# Patient Record
Sex: Female | Born: 1985 | Race: White | Hispanic: No | Marital: Married | Smoking: Former smoker
Health system: Southern US, Community
[De-identification: ages and names within clinical notes are randomized; demographics above are authoritative.]

## PROBLEM LIST (undated history)

## (undated) ENCOUNTER — Inpatient Hospital Stay: Payer: Self-pay

## (undated) DIAGNOSIS — I10 Essential (primary) hypertension: Secondary | ICD-10-CM

## (undated) DIAGNOSIS — R011 Cardiac murmur, unspecified: Secondary | ICD-10-CM

## (undated) DIAGNOSIS — G43909 Migraine, unspecified, not intractable, without status migrainosus: Secondary | ICD-10-CM

## (undated) DIAGNOSIS — D649 Anemia, unspecified: Secondary | ICD-10-CM

## (undated) DIAGNOSIS — R87629 Unspecified abnormal cytological findings in specimens from vagina: Secondary | ICD-10-CM

## (undated) HISTORY — PX: TONSILLECTOMY: SUR1361

## (undated) HISTORY — PX: WISDOM TOOTH EXTRACTION: SHX21

## (undated) HISTORY — PX: KNEE SURGERY: SHX244

---
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
## 2004-07-25 ENCOUNTER — Observation Stay: Payer: Self-pay

## 2004-10-06 ENCOUNTER — Observation Stay: Payer: Self-pay

## 2004-10-18 ENCOUNTER — Observation Stay: Payer: Self-pay

## 2004-10-29 ENCOUNTER — Observation Stay: Payer: Self-pay | Admitting: Obstetrics & Gynecology

## 2004-11-03 ENCOUNTER — Observation Stay: Payer: Self-pay

## 2004-11-07 ENCOUNTER — Inpatient Hospital Stay: Payer: Self-pay | Admitting: Obstetrics & Gynecology

## 2009-05-08 ENCOUNTER — Ambulatory Visit: Payer: Self-pay | Admitting: Internal Medicine

## 2009-06-01 ENCOUNTER — Ambulatory Visit: Payer: Self-pay | Admitting: Internal Medicine

## 2009-10-11 ENCOUNTER — Emergency Department: Payer: Self-pay | Admitting: Emergency Medicine

## 2009-10-13 ENCOUNTER — Other Ambulatory Visit: Payer: Self-pay

## 2012-07-11 ENCOUNTER — Emergency Department: Payer: Self-pay | Admitting: Internal Medicine

## 2012-07-11 LAB — CBC
HGB: 12.4 g/dL (ref 12.0–16.0)
MCH: 30.5 pg (ref 26.0–34.0)
Platelet: 235 10*3/uL (ref 150–440)
RBC: 4.07 10*6/uL (ref 3.80–5.20)
RDW: 12.1 % (ref 11.5–14.5)
WBC: 9.9 10*3/uL (ref 3.6–11.0)

## 2012-07-11 LAB — HCG, QUANTITATIVE, PREGNANCY: Beta Hcg, Quant.: 138923 m[IU]/mL — ABNORMAL HIGH

## 2012-07-11 LAB — URINALYSIS, COMPLETE
Glucose,UR: NEGATIVE mg/dL (ref 0–75)
Ketone: NEGATIVE
Nitrite: NEGATIVE
Ph: 5 (ref 4.5–8.0)
RBC,UR: 1 /HPF (ref 0–5)
Squamous Epithelial: 2
WBC UR: 3 /HPF (ref 0–5)

## 2012-09-20 ENCOUNTER — Encounter: Payer: Self-pay | Admitting: Maternal & Fetal Medicine

## 2012-10-09 ENCOUNTER — Encounter: Payer: Self-pay | Admitting: Pediatrics

## 2012-11-14 ENCOUNTER — Observation Stay: Payer: Self-pay

## 2012-11-14 LAB — URINALYSIS, COMPLETE
Bacteria: NONE SEEN
Bilirubin,UR: NEGATIVE
Glucose,UR: 50 mg/dL
Ketone: NEGATIVE
Leukocyte Esterase: NEGATIVE
Nitrite: NEGATIVE
Ph: 7
Protein: NEGATIVE
RBC,UR: NONE SEEN /HPF
Specific Gravity: 1.01
Squamous Epithelial: 1
WBC UR: 1 /HPF

## 2012-11-14 LAB — FETAL FIBRONECTIN
Appearance: NORMAL
Fetal Fibronectin: NEGATIVE

## 2012-12-05 ENCOUNTER — Observation Stay: Payer: Self-pay

## 2012-12-05 LAB — URINALYSIS, COMPLETE
Bilirubin,UR: NEGATIVE
Leukocyte Esterase: NEGATIVE
Ph: 6 (ref 4.5–8.0)
Protein: NEGATIVE
Specific Gravity: 1.006 (ref 1.003–1.030)
Squamous Epithelial: <1
WBC UR: <1 /HPF (ref 0–5)

## 2012-12-05 LAB — FETAL FIBRONECTIN
Appearance: NORMAL
Fetal Fibronectin: NEGATIVE

## 2013-01-08 ENCOUNTER — Observation Stay: Payer: Self-pay | Admitting: Obstetrics and Gynecology

## 2013-01-08 LAB — URINALYSIS, COMPLETE
Bilirubin,UR: NEGATIVE
Glucose,UR: 50 mg/dL (ref 0–75)
Ketone: NEGATIVE
Leukocyte Esterase: NEGATIVE
Nitrite: NEGATIVE
Ph: 7 (ref 4.5–8.0)
RBC,UR: 59 /HPF (ref 0–5)
Specific Gravity: 1.012 (ref 1.003–1.030)
WBC UR: 2 /HPF (ref 0–5)

## 2013-01-25 ENCOUNTER — Inpatient Hospital Stay: Payer: Self-pay | Admitting: Obstetrics and Gynecology

## 2013-01-25 LAB — CBC WITH DIFFERENTIAL/PLATELET
Basophil %: 0.6 %
Eosinophil %: 0.9 %
HCT: 27.8 % — ABNORMAL LOW (ref 35.0–47.0)
HGB: 9.8 g/dL — ABNORMAL LOW (ref 12.0–16.0)
Lymphocyte #: 1.2 10*3/uL (ref 1.0–3.6)
MCHC: 35.1 g/dL (ref 32.0–36.0)
Monocyte #: 0.5 x10 3/mm (ref 0.2–0.9)
Neutrophil #: 6.5 10*3/uL (ref 1.4–6.5)
Neutrophil %: 77.8 %
Platelet: 161 10*3/uL (ref 150–440)
RBC: 3.54 10*6/uL — ABNORMAL LOW (ref 3.80–5.20)
WBC: 8.4 10*3/uL (ref 3.6–11.0)

## 2014-05-20 DIAGNOSIS — I10 Essential (primary) hypertension: Secondary | ICD-10-CM | POA: Insufficient documentation

## 2014-06-06 ENCOUNTER — Ambulatory Visit: Payer: Self-pay | Admitting: Unknown Physician Specialty

## 2014-06-19 ENCOUNTER — Ambulatory Visit: Payer: Self-pay | Admitting: Unknown Physician Specialty

## 2014-06-19 LAB — POTASSIUM: Potassium: 3.6 mmol/L

## 2014-06-23 ENCOUNTER — Ambulatory Visit: Payer: Self-pay | Admitting: Unknown Physician Specialty

## 2014-07-18 NOTE — Consult Note (Signed)
Referral Information:  Reason for Referral 29 yo G3P1011 at  70 6/[redacted] weeks gestation by ultrasound performed at [facility] [place] on 06/12/12 (measurements c/w 6 weeks 4 days), referred for consultation due to chronic hypertension (on aldomet) and single umbilical artery and intracardiac echogenic focus seen on ultasound at [place].  She also reports history of tricuspid valve 'murmur' detected during her evaluation for chronic hypertension.   Referring Physician [facility]   Prenatal Hx she declined serum screening for aneuploidy   Past Obstetrical Hx 2006, 7lb 8 oz, female 55 6/7weeks, induction  "PIH" 8 week SAb   Home Medications: Medication Instructions Status  Prenatal Multivitamins Prenatal Multivitamins oral tablet 1 tab(s) orally once a day Active  Aldomet   once a day Active   Allergies:   Sulfa drugs: Hives  Coconut: Rash  Codeine: Other  Naproxen: Other, Headaches  Vital Signs/Notes:  Nursing Vital Signs: **Vital Signs.:   26-Jun-14 10:59  Temperature Temperature (F) 98.1  Celsius 36.7  Pulse Pulse 82  Respirations Respirations 20  Systolic BP Systolic BP 443  Diastolic BP (mmHg) Diastolic BP (mmHg) 77  Mean BP 92   Perinatal Consult:  PMed Hx Rubella Immune   Past Medical History cont'd Hypertension since 2011--previously on lisinopril and metoprolol Migraines--reports sxs are manageable 'headaches' and associates them with with bp elevations Tricuspid and mitral insufficiency (mild)--underwent evaluation due to tachycardia  (24h holter showed sinus tachycardia and no superventricular arrhythmia) Primary MD is Dr. 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   Soc Hx no tobacco, etoh, ilicit drug use   Review Of Systems:  Medications/Allergies Reviewed Medications/Allergies reviewed    Additional Lab/Radiology Notes  06/13/12 hct 36% platelets 247k bun/cr $RemoveBef'10mg'BxIImDzyQJ$ /dL/.58 mg/dL ast/alt 17/15 IU/L p:c ratio 121  2011 Echocardiogram (Dr Fath, [facility]) normal LV systolic function, EF 15%, normal RV adn LV size and function mild mitral and tricuspid insufficiency   Impression/Recommendations:  Impression We addressed concerns related to chronic hypertension in pregnancy, including: fetal growth restriction, superimposed preeclampsia (and possible need for preterm delivery), and abruption (particularly if hypertension is not controlled).  Her beta blocker is appropriate and can be reinitiated during pregnancy if she begins to develop tachycardia again.  It is not associated with teratogenicity. Beta blocker usage, in general, may be associated with lower birth weight infants. We addressed the used of low dose aspirin for preeclampsia risk reduction. Although her gestational age is beyond 67 weeks, she still may receive some benefit for risk reduction.  Recommendations for pregnancy surveillance are outlined below. 2. Single umbilical artery and intracardiac echogenic focus noted on detailed anatomic survey.   Recommendations 1. Baseline chemistry panel, cbc, liver function testing reviewed/normal.  Maternal Echo requested (from 2-3 years ago).  --Recommend she initiate low dose "baby' aspirin daily (81 mg) --Serial growth scans and antenatal testing as outlined below. 2. We performed a detailed anatomic survey and, in addition to mfm consultatation (see ultrasound report) she met with our genetic counselor due to single umbilical artery and an intracardiac echogenic focus. Growth surveillance recommendations for hypertension will also be appropriate for this condition since SUA may be associated with IUGR. She elected to have cell free fetal dna screening ('harmony') today.  We have scheduled her for a fetal echocardiogram in 2 weeks (due to her history and the combined ultrasound markers).  please see  ultrasound report and genetic consult  note for additional information.   Plan:  Genetic Counseling yes, "Harmony" screen performed today   Prenatal Diagnosis Options Level II [place]   Ultrasound at what gestational ages Monthly >24 weeks   Antepartum Testing Starting at 32 weeks, Weekly   Delivery at what gestational age 37-40 weeks, sooner if clinically indicated    Total Time Spent with Patient 30 minutes   >50% of visit spent in couseling/coordination of care yes   Office Use Only 99[address]  Level 2 (66min) NEW office consult exp prob focused   Coding Description: MATERNAL CONDITIONS/HISTORY INDICATION(S).   Chronic HTN.   HTN - Chronic.  Electronic Signatures: Manfred Shirts (MD)  (Signed 26-Jun-14 15:00)  Authored: Referral, Home Medications, Allergies, Vital Signs/Notes, Consult, Exam, Lab/Radiology Notes, Impression, Plan, Billing, Coding Description   Last Updated: 26-Jun-14 15:00 by Manfred Shirts (MD)

## 2014-07-18 NOTE — Consult Note (Signed)
Referral Information:  Reason for Referral Report entered in error. please refer to alternate document from same day of service.      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    Plan:  Genetic Counseling yes   Prenatal Diagnosis Options Level II US   Ultrasound at what gestational ages Monthly >24 weeks   Antepartum Testing Starting at 32 weeks, Weekly   Delivery at what gestational age 30-40 weeks, sooner if clinically indicated    Total Time Spent with Patient 15 minutes     (Removed):    Electronic Signatures: Consuelo PandySmall, Adnan Vanvoorhis (MD)  (Signed 26-Jun-14 12:34)  Authored: Referral, Home Medications, Allergies, Consult, Impression, Plan, Billing, Coding Description   Last Updated: 26-Jun-14 12:34 by Consuelo PandySmall, Makell Cyr (MD)

## 2014-07-27 NOTE — Op Note (Signed)
PATIENT NAME:  [name], Angela Weaver DATE OF BIRTH:  Oct 05, 1985  DATE OF PROCEDURE:  06/23/2014  PREOPERATIVE DIAGNOSIS: Intra-articular cyst lateral compartment right knee.    POSTOPERATIVE DIAGNOSIS: Intra-articular cyst lateral compartment right knee.    OPERATION:  Arthroscopic excision of the cyst.   SURGEON: Alda BertholdHarold B. Keniah Klemmer Jr., MD   ANESTHESIA: General.   HISTORY: The patient had pain about the lateral aspect of the right knee that had been persistent. Plain films have failed to reveal any significant bony abnormality. No osteonecrotic lesion noted. She did have an MRI of right knee which was consistent with large multiseptated intra-articular ganglion type cyst. The patient was ultimately brought in for surgery.   DESCRIPTION OF PROCEDURE: The patient was taken the operating room where satisfactory general anesthesia was achieved. A tourniquet was applied to the patient's right thigh and a leg holder was applied. The left lower extremity was supported with a well leg support device. The right knee was prepped and draped in the usual fashion for an arthroscopic procedure. An inflow cannula was introduced superomedially. The joint was distended with lactated Ringer's. The scope was introduced through an inferolateral puncture wound and a probe through an inferomedial puncture wound.   Inspection and probing of the medial compartment and lateral compartments failed to reveal any meniscal pathology. The chondral surfaces were smooth. The anterior cruciate appeared to be normal.   Inspection of the anterolateral aspect of the lateral compartment revealed a cystic mass that protruded into the joint. At this time, I went ahead and exsanguinated the right lower extremity and inflated the tourniquet. Probing of the mass caused blood to emanate from the mass or cyst. I went ahead and actually aspirated the cystic area and obtained about 10 mL of blood. I then resected the mass  using a combination of the motorized resector and angled ArthroCare wands. The mass basically extended from the anterior cruciate anterolaterally. It probably went to the mid lateral aspect of the lateral compartment. It seemed to be just anterior and inferior to the anterior horn of lateral meniscus and  the mid third of the lateral meniscus.   I did inspect the trochlear groove and it appeared to be smooth. The retropatellar surface was smooth. Patellar tracking was observed from the superomedial portal and indeed the patella seemed to track normally.   The instruments were removed from the joint. The puncture wounds were closed with 3-0 nylon in vertical mattress fashion. Several milliliters of 0.5% Marcaine with epinephrine were injected about the puncture wounds. Betadine was applied to the wounds followed by a sterile dressing and an ice pack.   The patient was awakened and transferred to her stretcher bed. She was taken to the recovery room in satisfactory condition. Blood loss was negligible.    ____________________________ Alda BertholdHarold B. Riannon Mukherjee Jr., MD hbk:AT D: 06/23/2014 13:01:18 ET T: 06/24/2014 02:19:52 ET JOB#: [id number]  cc: Alda BertholdHarold B. Kaarin Pardy Jr., MD, <Dictator> Randon GoldsmithHAROLD B Joseeduardo Brix, Montez HagemanJR MD ELECTRONICALLY SIGNED 07/07/2014 18:45

## 2014-08-05 NOTE — H&P (Signed)
L&D Evaluation:  History:  HPI 29 year old G3 P1011 with EDC=02/01/2013 by LMP=04/18/2012 presents at 528 5/7 weeks from the office with c/o lower abdominal cramping over the last few days. Cervix in office was reported as 1cm/40%. She denies dysuria, vaginal bleeding, unusual discharge, diarrhea, N/V. Has had increased palpitations recently and was found to be anemic with Hct=28% and was started on iron supplements. [facility] for HTN and SVT. She was on lisinopril/HCTX and metoprolol 50 mgm prior to pregnancy and was switched to ALdomet 250 mgm BID after positive UPT. Anatomy scan was remarkable for a SUA and intracardiac foci-she was seen in DP and Harmony test was negative. Recent growth scan revealed normal growth. Fetal echo was normal. She had an elevated one hr GTT and just had her 3 hr GTT today. 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 Hx of a SVD in 2006 delivering a 7-8 female [facility]   Presents with contractions, and cramping.   Patient's Medical History Hypertension  tachycardia/mild tricuspid and mitral insufficiency,, migraines   Patient's Surgical History Tonsilectomy, wisdom teeth extraction   Medications Pre Natal Vitamins  Aldomet 250 mgm BID   Allergies sulfa (hives), coconut, codeine, Naprasyn   Social History none   Family History Non-Contributory   ROS:  ROS see HPI   Exam:  Vital Signs stable  133/83   Urine Protein UA negative   General no apparent distress   Mental Status clear   Abdomen gravid, non-tender   Pelvic no external lesions, closed int os, ext os 1 cm/40%/-3   Mebranes Intact   FHT normal rate with no decels, 135 baseline with accels to 150 s to 160s   FHT Description mod variability   Ucx 2 contractions in 90 minutes   Skin dry   Lymph no lymphadenopathy   Other FFN negative.   Impression:  Impression IUP at 28 5/7 weeks with preterm contractions/uterine irritability. Reactive NST.    Plan:  Plan Discussed treatment options: discussed use of maternity support garment, restricing work hours, use of BMZ incase of preterm delivery. Patient wishes to continue working as long as possible and declines BMZ at this time.  FU in office in 1 weeks or sooner prn. PTL precautions given.   Electronic Signatures: Trinna BalloonGutierrez, Fleda Pagel L (CNM)  (Signed 20-Aug-14 16:50)  Authored: L&D Evaluation   Last Updated: 20-Aug-14 16:50 by Trinna BalloonGutierrez, Nikaela Coyne L (CNM)

## 2014-08-05 NOTE — H&P (Signed)
L&D Evaluation:  History:  HPI 29 year old G3 P1011 with EDC=02/01/2013 by LMP=04/18/2012 presents at 7831 5/7 weeks from the office with c/o frequent contractions since 1300 today. She denies dysuria, vaginal bleeding, unusual discharge, diarrhea, N/V. Has been drinking lots of water to stay hydrated since seen 3 weeks ago for preterm contractions. [facility] also remak[facility] for HTN and SVT. She was on lisinopril/HCTX and metoprolol 50 mgm prior to pregnancy and was switched to ALdomet 250 mgm BID after positive UPT. She has been recently switched to labetalol to also help with palpitations. Anatomy scan was remarkable for a SUA and intracardiac foci-she was seen in DP and Harmony test was negative. Recent growth scan revealed normal growth. Fetal echo was normal. She had an elevated one hr GTT and  had  a normal 3 hr GTT . 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 Her contractions started this afternoon while at work. Hx of a SVD in 2006 delivering a 7-8 female [facility]   Presents with contractions, and cramping.   Patient's Medical History Hypertension  tachycardia/mild tricuspid and mitral insufficiency,, migraines   Patient's Surgical History Tonsilectomy, wisdom teeth extraction   Medications Pre Natal Vitamins  labetalol 100 mgm BID (last dose this AM), Fusion Plus daily   Allergies sulfa (hives), coconut, codeine, Naprasyn   Social History none   Family History Non-Contributory   ROS:  ROS see HPI   Exam:  Vital Signs 146/95   Urine Protein 1.006, 2+ ketones, neg protein/ nitrit/leuks   General no apparent distress   Mental Status clear   Chest clear   Heart normal sinus rhythm   Abdomen gravid, non-tender   Edema no edema   Pelvic no external lesions, closed int os/long/OOP   Mebranes Intact   FHT normal rate with no decels, 125-130 baseline with accels to 150s-170   Ucx q4-5 min apart   Impression:   Impression IUP at 31 5/7 weeks with threatened preterm labor.   Plan:  Plan EFM/NST, monitor contractions and for cervical change, FFN done. Procardia 10 mgm po x1. PO fluids   Electronic Signatures: Trinna BalloonGutierrez, Emani Morad L (CNM)  (Signed 15-Sep-14 23:16)  Authored: L&D Evaluation   Last Updated: 15-Sep-14 23:16 by Trinna BalloonGutierrez, Kaitlyn Skowron L (CNM)

## 2014-08-05 NOTE — H&P (Signed)
L&D Evaluation:  History:  HPI 29 year old G3 P1011 [facility] with lumbar back pain,    [facility] for HTN and SVT. She was on lisinopril/HCTZ and metoprolol 50 mgm prior to pregnancy and was switched to ALdomet 250 mgm BID after positive UPT. She has been recently switched to labetalol to also help with palpitations. Anatomy scan was remarkable for a SUA and intracardiac foci-she was seen in [facility] and Harmony test was negative. Recent growth scan revealed normal growth. Fetal echo was normal. She had an elevated one hr GTT and  had  a normal 3 hr GTT . She is also taking Fusion Plus for anemia.  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  Hx of a SVD in 2006 delivering a 7-8 female after IOL for PIH   Presents with back pain   Patient's Medical History Hypertension  tachycardia/mild tricuspid and mitral insufficiency,, migraines   Patient's Surgical History Tonsilectomy, wisdom teeth extraction   Medications Pre Natal Vitamins  labetalol 100 mgm BID (last dose this AM), Fusion Plus daily   Allergies sulfa (hives), coconut, codeine, Naprasyn   Social History none   Family History Non-Contributory   ROS:  ROS see HPI   Exam:  Vital Signs 125-150/70-98   General no apparent distress   Mental Status clear   Abdomen gravid, non-tender   Estimated Fetal Weight Average for gestational age   Edema no edema    Pelvic no external lesions, ftp   Mebranes Intact   FHT normal rate with no decels, 135, moderate, +accels, no decels category I tracing   Ucx q30 minutes   Impression:  Impression IUP at 36 4/7 weeks presenting with lumbar back pain   Plan:  Plan EFM/NST, monitor contractions and for cervical change   Comments 1) Lumbago - negative UA, reproducible paraspinal muscle tenderness.  Rx for flexeril 10mg  tab po q8hrs prn back pain.  Also discussed tyelnol and heat to area  2) Fetus - category I tracing  3)  Disposition - HROB follow up on 01/10/13   Electronic Signatures: Lorrene ReidStaebler, Naleyah Ohlinger M (MD)  (Signed 14-Oct-14 18:11)  Authored: L&D Evaluation   Last Updated: 14-Oct-14 18:11 by Lorrene ReidStaebler, Corinthian Mizrahi M (MD)

## 2015-03-29 NOTE — L&D Delivery Note (Signed)
Delivery Note Primary OB: [facility] Delivery Physician: Angela MajorPaul Inell Mimbs, MD Gestational Age: Full term Antepartum complications: Gestational Hypertension Intrapartum complications: None  A viable Female was delivered via vertex perentation.  Apgars:8 ,9  Weight:  pending .   Placenta status: spontaneous and Intact.  Cord: 3+ vessels;  with the following complications: none.  Anesthesia:  epidural Episiotomy:  midline without extension Lacerations:  none Suture Repair: 2.0 vicryl Est. Blood Loss (mL):  200 mL  Mom to postpartum.  Baby to Couplet care / Skin to Skin.  Angela MajorPaul Arian Murley, MD Dept of OB/GYN [phone]

## 2015-05-12 ENCOUNTER — Other Ambulatory Visit: Payer: Self-pay | Admitting: Family Medicine

## 2015-05-12 DIAGNOSIS — I10 Essential (primary) hypertension: Secondary | ICD-10-CM

## 2015-05-15 ENCOUNTER — Ambulatory Visit: Payer: BLUE CROSS/BLUE SHIELD

## 2015-08-17 LAB — OB RESULTS CONSOLE HEPATITIS B SURFACE ANTIGEN: Hepatitis B Surface Ag: NEGATIVE

## 2015-12-30 LAB — OB RESULTS CONSOLE RPR: RPR: NONREACTIVE

## 2015-12-30 LAB — OB RESULTS CONSOLE HIV ANTIBODY (ROUTINE TESTING): HIV: NONREACTIVE

## 2016-02-29 LAB — OB RESULTS CONSOLE GBS: STREP GROUP B AG: NEGATIVE

## 2016-03-09 ENCOUNTER — Observation Stay
Admission: EM | Admit: 2016-03-09 | Discharge: 2016-03-09 | Disposition: A | Discharge status: Home / Self Care | Payer: BLUE CROSS/BLUE SHIELD | Attending: Obstetrics and Gynecology | Admitting: Obstetrics and Gynecology

## 2016-03-09 DIAGNOSIS — O4290 Premature rupture of membranes, unspecified as to length of time between rupture and onset of labor, unspecified weeks of gestation: Principal | ICD-10-CM | POA: Insufficient documentation

## 2016-03-09 HISTORY — DX: Essential (primary) hypertension: I10

## 2016-03-09 NOTE — OB Triage Note (Signed)
Pt arrived to OBS rm 3 with c/o FOL starting at 0600 this morning having to change underwear x3. PH paper used and was negative for SROM. Pt placed on monitor and oriented to room. Pt c/o lower back pain/ cramps that come and go but nothing consistent or pattern like. No vaginal bleeding

## 2016-03-09 NOTE — Discharge Instructions (Signed)
Pleas return if water breaks, contractions increase in intensity, period like bleeding. Drink lots of fluids and get plenty of rest.

## 2016-03-09 NOTE — Final Progress Note (Signed)
Physician Final Progress Note  Patient ID: Angela Weaver MRN: [id number] DOB/AGE: 25-Jun-1985 30 y.o.  Admit date: 03/09/2016 Admitting provider: Vena AustriaAndreas Danialle Dement, MD Discharge date: 03/09/2016   Admission Diagnoses: Leaking amniotic fluid  Discharge Diagnoses:  Active Problems:   Amniotic fluid leaking   No evidence of ROM negative nitrazine  Consults: None  Significant Findings/ Diagnostic Studies: none  Procedures: 135-140, moderate variability, +accels reactive NST  Discharge Condition: good  Disposition:   Diet: Regular diet  Discharge Activity: Activity as tolerated  Discharge Instructions    Discharge diet:  No restrictions    Complete by:  As directed    LABOR:  When conractions begin, you should start to time them from the beginning of one contraction to the beginning  of the next.  When contractions are 5 - 10 minutes apart or less and have been regular for at least an hour, you should call your health care provider.    Complete by:  As directed    No sexual activity restrictions    Complete by:  As directed    Notify physician for bleeding from the vagina    Complete by:  As directed    Notify physician for blurring of vision or spots before the eyes    Complete by:  As directed    Notify physician for chills or fever    Complete by:  As directed    Notify physician for fainting spells, "black outs" or loss of consciousness    Complete by:  As directed    Notify physician for increase in vaginal discharge    Complete by:  As directed    Notify physician for leaking of fluid    Complete by:  As directed    Notify physician for pain or burning when urinating    Complete by:  As directed    Notify physician for pelvic pressure (sudden increase)    Complete by:  As directed    Notify physician for severe or continued nausea or vomiting    Complete by:  As directed    Notify physician for sudden gushing of fluid from the vagina (with or without  continued leaking)    Complete by:  As directed    Notify physician for sudden, constant, or occasional abdominal pain    Complete by:  As directed    Notify physician if baby moving less than usual    Complete by:  As directed        Medication List    TAKE these medications   labetalol 100 MG tablet Commonly known as:  NORMODYNE Take 100 mg by mouth 2 (two) times daily.   prenatal vitamin w/FE, FA 27-1 MG Tabs tablet Take 1 tablet by mouth daily at 12 noon.        Total time spent taking care of this patient: 15 minutes, triaged remotely  Signed: Lorrene ReidSTAEBLER, [name] 03/09/2016, 1:33 PM

## 2016-03-15 ENCOUNTER — Inpatient Hospital Stay
Admission: RE | Admit: 2016-03-15 | Discharge: 2016-03-18 | DRG: 774 | Disposition: A | Discharge status: Home / Self Care | Payer: BLUE CROSS/BLUE SHIELD | Source: Ambulatory Visit | Attending: Obstetrics & Gynecology | Admitting: Obstetrics & Gynecology

## 2016-03-15 ENCOUNTER — Encounter: Payer: Self-pay | Admitting: Obstetrics and Gynecology

## 2016-03-15 DIAGNOSIS — Z3A38 38 weeks gestation of pregnancy: Secondary | ICD-10-CM

## 2016-03-15 DIAGNOSIS — O1002 Pre-existing essential hypertension complicating childbirth: Secondary | ICD-10-CM | POA: Diagnosis present

## 2016-03-15 DIAGNOSIS — O163 Unspecified maternal hypertension, third trimester: Secondary | ICD-10-CM | POA: Diagnosis present

## 2016-03-15 HISTORY — DX: Anemia, unspecified: D64.9

## 2016-03-15 HISTORY — DX: Migraine, unspecified, not intractable, without status migrainosus: G43.909

## 2016-03-15 HISTORY — DX: Unspecified abnormal cytological findings in specimens from vagina: R87.629

## 2016-03-15 LAB — COMPREHENSIVE METABOLIC PANEL
ALBUMIN: 3 g/dL — AB (ref 3.5–5.0)
ALT: 10 U/L — ABNORMAL LOW (ref 14–54)
ANION GAP: 9 (ref 5–15)
AST: 32 U/L (ref 15–41)
Alkaline Phosphatase: 115 U/L (ref 38–126)
BILIRUBIN TOTAL: 0.4 mg/dL (ref 0.3–1.2)
BUN: 7 mg/dL (ref 6–20)
CHLORIDE: 106 mmol/L (ref 101–111)
CO2: 20 mmol/L — AB (ref 22–32)
Calcium: 8.6 mg/dL — ABNORMAL LOW (ref 8.9–10.3)
Creatinine, Ser: 0.63 mg/dL (ref 0.44–1.00)
GFR calc Af Amer: >60 mL/min (ref >60)
GFR calc non Af Amer: >60 mL/min (ref >60)
GLUCOSE: 140 mg/dL — AB (ref 65–99)
POTASSIUM: 3.6 mmol/L (ref 3.5–5.1)
Sodium: 135 mmol/L (ref 135–145)
TOTAL PROTEIN: 6.6 g/dL (ref 6.5–8.1)

## 2016-03-15 LAB — CBC
HCT: 29.4 % — ABNORMAL LOW (ref 35.0–47.0)
HEMOGLOBIN: 10.2 g/dL — AB (ref 12.0–16.0)
MCH: 27.4 pg (ref 26.0–34.0)
MCHC: 34.6 g/dL (ref 32.0–36.0)
MCV: 79.2 fL — ABNORMAL LOW (ref 80.0–100.0)
Platelets: 190 10*3/uL (ref 150–440)
RBC: 3.71 MIL/uL — AB (ref 3.80–5.20)
RDW: 13.2 % (ref 11.5–14.5)
WBC: 8.2 10*3/uL (ref 3.6–11.0)

## 2016-03-15 LAB — TYPE AND SCREEN
ABO/RH(D): A POS
ANTIBODY SCREEN: NEGATIVE

## 2016-03-15 MED ORDER — LACTATED RINGERS IV SOLN: 500.0000 mL | INTRAVENOUS | Status: DC | PRN

## 2016-03-15 MED ORDER — LACTATED RINGERS IV SOLN
INTRAVENOUS | Status: DC
  Administered 2016-03-15 – 2016-03-17 (×5): via INTRAVENOUS

## 2016-03-15 MED ORDER — OXYTOCIN BOLUS FROM INFUSION: 500.0000 mL | Freq: Once | INTRAVENOUS | Status: DC

## 2016-03-15 MED ORDER — SOD CITRATE-CITRIC ACID 500-334 MG/5ML PO SOLN: 30.0000 mL | ORAL | Status: DC | PRN

## 2016-03-15 MED ORDER — TERBUTALINE SULFATE 1 MG/ML IJ SOLN: 0.2500 mg | Freq: Once | INTRAMUSCULAR | Status: DC | PRN

## 2016-03-15 MED ORDER — DINOPROSTONE 10 MG VA INST
10.0000 mg | VAGINAL_INSERT | Freq: Once | VAGINAL | Status: AC
  Administered 2016-03-15: 10 mg via VAGINAL
  Filled 2016-03-15: qty 1

## 2016-03-15 MED ORDER — DIPHENHYDRAMINE HCL 25 MG PO CAPS
ORAL_CAPSULE | ORAL | Status: AC
Start: 2016-03-15 — End: 2016-03-15
  Administered 2016-03-15: 25 mg via ORAL
  Filled 2016-03-15: qty 1

## 2016-03-15 MED ORDER — DIPHENHYDRAMINE HCL 25 MG PO CAPS
25.0000 mg | ORAL_CAPSULE | Freq: Every evening | ORAL | Status: DC | PRN
Start: 2016-03-15 — End: 2016-03-17
  Administered 2016-03-15: 25 mg via ORAL

## 2016-03-15 MED ORDER — LIDOCAINE HCL (PF) 1 % IJ SOLN: 30.0000 mL | INTRAMUSCULAR | Status: DC | PRN

## 2016-03-15 MED ORDER — ONDANSETRON HCL 4 MG/2ML IJ SOLN: 4.0000 mg | Freq: Four times a day (QID) | INTRAMUSCULAR | Status: DC | PRN

## 2016-03-15 MED ORDER — OXYTOCIN 40 UNITS IN LACTATED RINGERS INFUSION - SIMPLE MED: 2.5000 [IU]/h | INTRAVENOUS | Status: DC

## 2016-03-15 MED ORDER — OXYTOCIN 10 UNIT/ML IJ SOLN: 10.0000 [IU] | Freq: Once | INTRAMUSCULAR | Status: DC

## 2016-03-15 NOTE — Progress Notes (Signed)
L&D progress Note  S: Has a mild headache today and some nausea. Declined medication for same  O: BP: 151/86, 134/87, 120/67 General: in NAD, alert, answering questions appropriately Results for orders placed or performed during the hospital encounter of 03/15/16 (from the past 24 hour(s))  CBC     Status: Abnormal   Collection Time: 03/15/16  7:59 PM  Result Value Ref Range   WBC 8.2 3.6 - 11.0 K/uL   RBC 3.71 (L) 3.80 - 5.20 MIL/uL   Hemoglobin 10.2 (L) 12.0 - 16.0 g/dL   HCT 32.429.4 (L) 40.135.0 - 02.747.0 %   MCV 79.2 (L) 80.0 - 100.0 fL   MCH 27.4 26.0 - 34.0 pg   MCHC 34.6 32.0 - 36.0 g/dL   RDW 25.313.2 66.411.5 - 40.314.5 %   Platelets 190 150 - 440 K/uL  Comprehensive metabolic panel     Status: Abnormal   Collection Time: 03/15/16  7:59 PM  Result Value Ref Range   Sodium 135 135 - 145 mmol/L   Potassium 3.6 3.5 - 5.1 mmol/L   Chloride 106 101 - 111 mmol/L   CO2 20 (L) 22 - 32 mmol/L   Glucose, Bld 140 (H) 65 - 99 mg/dL   BUN 7 6 - 20 mg/dL   Creatinine, Ser 4.740.63 0.44 - 1.00 mg/dL   Calcium 8.6 (L) 8.9 - 10.3 mg/dL   Total Protein 6.6 6.5 - 8.1 g/dL   Albumin 3.0 (L) 3.5 - 5.0 g/dL   AST 32 15 - 41 U/L   ALT 10 (L) 14 - 54 U/L   Alkaline Phosphatase 115 38 - 126 U/L   Total Bilirubin 0.4 0.3 - 1.2 mg/dL   GFR calc non Af Amer >60 >60 mL/min   GFR calc Af Amer >60 >60 mL/min   Anion gap 9 5 - 15  Type and screen [facility]     Status: None   Collection Time: 03/15/16  8:00 PM  Result Value Ref Range   ABO/RH(D) A POS    Antibody Screen NEG    Sample Expiration 03/18/2016   Cervix: 1/50%/-2 FHR: 125-130 with accelerations to 170s to 180, moderate variability Toco: irregular mild contractions q5-7 min apart  A: IUP at 38wk4d with CHTN (on labetalol) with headaches and nausea Mild range to normal blood pressures NO evidence of preeclampsia lab wise, but awaiting PC ratio For induction of labor Unripe cervix FWB: Cat 1  P: Cervidil ripening explained by Dr  Jean RosenthalJackson and myself prior to insertion of Cervidil at 2208 Monitor for onset strong contractions Monitor FWB closely Vs q4 WA if normotensive Benadryl for sleep  Liany Mumpower, CNM  Aowyn Rozeboom, CNM

## 2016-03-15 NOTE — H&P (Signed)
See scanned admission H&P from Dr. Annamarie MajorPaul Harris.

## 2016-03-16 LAB — PROTEIN / CREATININE RATIO, URINE
Creatinine, Urine: 86 mg/dL
PROTEIN CREATININE RATIO: 0.12 mg/mg{creat} (ref 0.00–0.15)
TOTAL PROTEIN, URINE: 10 mg/dL

## 2016-03-16 LAB — OB RESULTS CONSOLE RUBELLA ANTIBODY, IGM: RUBELLA: IMMUNE

## 2016-03-16 LAB — OB RESULTS CONSOLE VARICELLA ZOSTER ANTIBODY, IGG: Varicella: NON-IMMUNE/NOT IMMUNE

## 2016-03-16 MED ORDER — BUTORPHANOL TARTRATE 1 MG/ML IJ SOLN
1.0000 mg | INTRAMUSCULAR | Status: DC | PRN
  Administered 2016-03-16 – 2016-03-17 (×3): 1 mg via INTRAVENOUS
  Filled 2016-03-16 (×3): qty 1

## 2016-03-16 MED ORDER — MISOPROSTOL 200 MCG PO TABS
ORAL_TABLET | ORAL | Status: AC
  Filled 2016-03-16: qty 4

## 2016-03-16 MED ORDER — LABETALOL HCL 100 MG PO TABS
100.0000 mg | ORAL_TABLET | Freq: Two times a day (BID) | ORAL | Status: DC
  Administered 2016-03-16 (×2): 100 mg via ORAL
  Filled 2016-03-16 (×2): qty 1

## 2016-03-16 MED ORDER — OXYTOCIN 40 UNITS IN LACTATED RINGERS INFUSION - SIMPLE MED
1.0000 m[IU]/min | INTRAVENOUS | Status: DC
  Administered 2016-03-16: 1 m[IU]/min via INTRAVENOUS
  Administered 2016-03-16: 14 m[IU]/min via INTRAVENOUS
  Administered 2016-03-17: 19 m[IU]/min via INTRAVENOUS

## 2016-03-16 MED ORDER — TERBUTALINE SULFATE 1 MG/ML IJ SOLN: 0.2500 mg | Freq: Once | INTRAMUSCULAR | Status: DC | PRN

## 2016-03-16 MED ORDER — ACETAMINOPHEN 325 MG PO TABS
650.0000 mg | ORAL_TABLET | Freq: Four times a day (QID) | ORAL | Status: DC | PRN
  Administered 2016-03-16 – 2016-03-17 (×3): 650 mg via ORAL
  Filled 2016-03-16 (×3): qty 2

## 2016-03-16 MED ORDER — AMMONIA AROMATIC IN INHA
RESPIRATORY_TRACT | Status: AC
  Filled 2016-03-16: qty 10

## 2016-03-16 MED ORDER — LIDOCAINE HCL (PF) 1 % IJ SOLN
INTRAMUSCULAR | Status: AC
  Filled 2016-03-16: qty 30

## 2016-03-16 MED ORDER — SODIUM CHLORIDE FLUSH 0.9 % IV SOLN
INTRAVENOUS | Status: AC
  Filled 2016-03-16: qty 10

## 2016-03-16 NOTE — Progress Notes (Signed)
  Labor Progress Note   30 y.o. [id number] @ 6129w5d , admitted for  Pregnancy, Labor Management. IOL GEST HTN.  Subjective:  Mild pain currently  Objective:  BP 121/79   Pulse (!) 105   Temp 97.9 F (36.6 C) (Oral)   Resp 16   Ht 5\' 6"  (1.676 m)   Wt 171 lb (77.6 kg)   BMI 27.60 kg/m  Abd: mild Extr: trace to 1+ bilateral pedal edema SVE: deferred  EFM: FHR: 140 bpm, variability: moderate,  accelerations:  Present,  decelerations:  Absent Toco: Frequency: Every 5 minutes Labs: I have reviewed the patient's lab results.   Assessment & Plan:  [id number] @ 8429w5d, admitted for  Pregnancy and Labor/Delivery Management  1. Pain management: none. 2. FWB: FHT category 1.  3. ID: GBS negative 4. Labor management: Pitocin. Desires epidural.  HTN stable and under control.  All discussed with patient, see orders

## 2016-03-16 NOTE — Progress Notes (Signed)
L&D progress Note  S: Has a mild headache, denies blurry vision, epigastric pain, CP, SOB, or change in edema.  Cervadil overnight  O: BP: mostly normal.  P 80.  Afebrile. General: in NAD, alert, answering questions appropriately Abd gravid Vtx min T Cervix- 2/70/-3 Ctxs irreg q 3-6 min  Results for orders placed or performed during the hospital encounter of 03/15/16 (from the past 24 hour(s))  CBC     Status: Abnormal   Collection Time: 03/15/16  7:59 PM  Result Value Ref Range   WBC 8.2 3.6 - 11.0 K/uL   RBC 3.71 (L) 3.80 - 5.20 MIL/uL   Hemoglobin 10.2 (L) 12.0 - 16.0 g/dL   HCT 40.929.4 (L) 81.135.0 - 91.447.0 %   MCV 79.2 (L) 80.0 - 100.0 fL   MCH 27.4 26.0 - 34.0 pg   MCHC 34.6 32.0 - 36.0 g/dL   RDW 78.213.2 95.611.5 - 21.314.5 %   Platelets 190 150 - 440 K/uL  Comprehensive metabolic panel     Status: Abnormal   Collection Time: 03/15/16  7:59 PM  Result Value Ref Range   Sodium 135 135 - 145 mmol/L   Potassium 3.6 3.5 - 5.1 mmol/L   Chloride 106 101 - 111 mmol/L   CO2 20 (L) 22 - 32 mmol/L   Glucose, Bld 140 (H) 65 - 99 mg/dL   BUN 7 6 - 20 mg/dL   Creatinine, Ser 0.860.63 0.44 - 1.00 mg/dL   Calcium 8.6 (L) 8.9 - 10.3 mg/dL   Total Protein 6.6 6.5 - 8.1 g/dL   Albumin 3.0 (L) 3.5 - 5.0 g/dL   AST 32 15 - 41 U/L   ALT 10 (L) 14 - 54 U/L   Alkaline Phosphatase 115 38 - 126 U/L   Total Bilirubin 0.4 0.3 - 1.2 mg/dL   GFR calc non Af Amer >60 >60 mL/min   GFR calc Af Amer >60 >60 mL/min   Anion gap 9 5 - 15  Type and screen [facility]     Status: None   Collection Time: 03/15/16  8:00 PM  Result Value Ref Range   ABO/RH(D) A POS    Antibody Screen NEG    Sample Expiration 03/18/2016   Protein / creatinine ratio, urine     Status: None   Collection Time: 03/15/16  8:00 PM  Result Value Ref Range   Creatinine, Urine 86 mg/dL   Total Protein, Urine 10 mg/dL   Protein Creatinine Ratio 0.12 0.00 - 0.15 mg/mg[Cre]  OB RESULTS CONSOLE Rubella Antibody     Status:  None   Collection Time: 03/16/16 12:00 AM  Result Value Ref Range   Rubella Immune   OB RESULTS CONSOLE Varicella zoster antibody, IgG     Status: None   Collection Time: 03/16/16 12:00 AM  Result Value Ref Range   Varicella Nonimmune    FHR: 140 with accelerations to 160s , moderate variability Toco: irregular mild contractions q5-7 min apart  A: IUP at 38wk5d with CHTN (on labetalol) with headaches Mild range to normal blood pressures NO evidence of preeclampsia lab wise Cont induction of labor Pitocin FWB: Cat 1  Angela Libraobert Paul Joud Ingwersen, MD

## 2016-03-16 NOTE — Progress Notes (Signed)
  Labor Progress Note   30 y.o. [id number] @ 8216w5d , admitted for  Pregnancy, Labor Management. IOL GEST HTN.  Subjective:  Mod pain currently Pitocin, contracting regularly  Objective:  BP 126/82 (BP Location: Left Arm)   Pulse 90   Temp 98 F (36.7 C) (Oral)   Resp 16   Ht 5\' 6"  (1.676 m)   Wt 171 lb (77.6 kg)   BMI 27.60 kg/m  Abd: mod Extr: trace to 1+ bilateral pedal edema SVE: 3/70/-3  EFM: FHR: 140 bpm, variability: moderate,  accelerations:  Present,  decelerations:  Absent Toco: Frequency: Every 2-4 minutes  Assessment & Plan:  [id number] @ 7016w5d, admitted for  Pregnancy and Labor/Delivery Management due to Gestational HTN  1. Pain management: none. 2. FWB: FHT category 1.  3. ID: GBS negative 4. Labor management: Pitocin.  Stadol now.  HTN stable and under control.  All discussed with patient, see orders

## 2016-03-17 ENCOUNTER — Encounter: Payer: Self-pay | Admitting: *Deleted

## 2016-03-17 ENCOUNTER — Inpatient Hospital Stay: Payer: BLUE CROSS/BLUE SHIELD | Admitting: Anesthesiology

## 2016-03-17 LAB — CBC WITH DIFFERENTIAL/PLATELET
BASOS PCT: 1 %
Basophils Absolute: 0 10*3/uL (ref 0–0.1)
Eosinophils Absolute: 0 10*3/uL (ref 0–0.7)
Eosinophils Relative: 0 %
HEMATOCRIT: 27.8 % — AB (ref 35.0–47.0)
HEMOGLOBIN: 9.8 g/dL — AB (ref 12.0–16.0)
LYMPHS ABS: 0.9 10*3/uL — AB (ref 1.0–3.6)
Lymphocytes Relative: 12 %
MCH: 27.7 pg (ref 26.0–34.0)
MCHC: 35 g/dL (ref 32.0–36.0)
MCV: 78.9 fL — ABNORMAL LOW (ref 80.0–100.0)
MONO ABS: 0.6 10*3/uL (ref 0.2–0.9)
MONOS PCT: 7 %
NEUTROS ABS: 6.4 10*3/uL (ref 1.4–6.5)
Neutrophils Relative %: 80 %
Platelets: 159 10*3/uL (ref 150–440)
RBC: 3.53 MIL/uL — ABNORMAL LOW (ref 3.80–5.20)
RDW: 13.4 % (ref 11.5–14.5)
WBC: 7.9 10*3/uL (ref 3.6–11.0)

## 2016-03-17 LAB — RPR: RPR: NONREACTIVE

## 2016-03-17 MED ORDER — NALBUPHINE HCL 10 MG/ML IJ SOLN: 5.0000 mg | Freq: Once | INTRAMUSCULAR | Status: DC | PRN

## 2016-03-17 MED ORDER — ONDANSETRON HCL 4 MG/2ML IJ SOLN: 4.0000 mg | INTRAMUSCULAR | Status: DC | PRN

## 2016-03-17 MED ORDER — ZOLPIDEM TARTRATE 5 MG PO TABS: 5.0000 mg | ORAL_TABLET | Freq: Every evening | ORAL | Status: DC | PRN

## 2016-03-17 MED ORDER — NALOXONE HCL 0.4 MG/ML IJ SOLN: 0.4000 mg | INTRAMUSCULAR | Status: DC | PRN

## 2016-03-17 MED ORDER — LIDOCAINE-EPINEPHRINE (PF) 1.5 %-1:200000 IJ SOLN
INTRAMUSCULAR | Status: DC | PRN
  Administered 2016-03-17: 3 mL via EPIDURAL

## 2016-03-17 MED ORDER — DIBUCAINE 1 % RE OINT: 1.0000 "application " | TOPICAL_OINTMENT | RECTAL | Status: DC | PRN

## 2016-03-17 MED ORDER — SIMETHICONE 80 MG PO CHEW: 80.0000 mg | CHEWABLE_TABLET | ORAL | Status: DC | PRN

## 2016-03-17 MED ORDER — SODIUM CHLORIDE 0.9% FLUSH: 3.0000 mL | Freq: Two times a day (BID) | INTRAVENOUS | Status: DC

## 2016-03-17 MED ORDER — ONDANSETRON HCL 4 MG PO TABS: 4.0000 mg | ORAL_TABLET | ORAL | Status: DC | PRN

## 2016-03-17 MED ORDER — DIPHENHYDRAMINE HCL 50 MG/ML IJ SOLN: 12.5000 mg | INTRAMUSCULAR | Status: DC | PRN

## 2016-03-17 MED ORDER — FENTANYL 2.5 MCG/ML W/ROPIVACAINE 0.2% IN NS 100 ML EPIDURAL INFUSION (ARMC-ANES)
EPIDURAL | Status: DC | PRN
  Administered 2016-03-17: 10 mL/h via EPIDURAL

## 2016-03-17 MED ORDER — SODIUM CHLORIDE 0.9% FLUSH: 3.0000 mL | INTRAVENOUS | Status: DC | PRN

## 2016-03-17 MED ORDER — WITCH HAZEL-GLYCERIN EX PADS: 1.0000 "application " | MEDICATED_PAD | CUTANEOUS | Status: DC | PRN

## 2016-03-17 MED ORDER — IBUPROFEN 600 MG PO TABS: 600.0000 mg | ORAL_TABLET | Freq: Four times a day (QID) | ORAL | Status: DC | PRN

## 2016-03-17 MED ORDER — OXYCODONE-ACETAMINOPHEN 5-325 MG PO TABS
2.0000 | ORAL_TABLET | ORAL | Status: DC | PRN
  Administered 2016-03-18: 2 via ORAL
  Filled 2016-03-17: qty 2

## 2016-03-17 MED ORDER — VARICELLA VIRUS VACCINE LIVE 1350 PFU/0.5ML IJ SUSR
0.5000 mL | Freq: Once | INTRAMUSCULAR | Status: DC
  Filled 2016-03-17: qty 0.5

## 2016-03-17 MED ORDER — MEPERIDINE HCL 25 MG/ML IJ SOLN: 6.2500 mg | INTRAMUSCULAR | Status: DC | PRN

## 2016-03-17 MED ORDER — ACETAMINOPHEN 325 MG PO TABS: 650.0000 mg | ORAL_TABLET | ORAL | Status: DC | PRN

## 2016-03-17 MED ORDER — ONDANSETRON HCL 4 MG/2ML IJ SOLN: 4.0000 mg | Freq: Three times a day (TID) | INTRAMUSCULAR | Status: DC | PRN

## 2016-03-17 MED ORDER — NALBUPHINE HCL 10 MG/ML IJ SOLN: 5.0000 mg | INTRAMUSCULAR | Status: DC | PRN

## 2016-03-17 MED ORDER — SCOPOLAMINE 1 MG/3DAYS TD PT72: 1.0000 | MEDICATED_PATCH | Freq: Once | TRANSDERMAL | Status: DC

## 2016-03-17 MED ORDER — OXYCODONE-ACETAMINOPHEN 5-325 MG PO TABS
1.0000 | ORAL_TABLET | ORAL | Status: DC | PRN
  Administered 2016-03-17 – 2016-03-18 (×3): 1 via ORAL
  Filled 2016-03-17 (×3): qty 1

## 2016-03-17 MED ORDER — SODIUM CHLORIDE 0.9 % IV SOLN: 250.0000 mL | INTRAVENOUS | Status: DC | PRN

## 2016-03-17 MED ORDER — NALOXONE HCL 2 MG/2ML IJ SOSY: 1.0000 ug/kg/h | PREFILLED_SYRINGE | INTRAMUSCULAR | Status: DC | PRN

## 2016-03-17 MED ORDER — FENTANYL 2.5 MCG/ML W/ROPIVACAINE 0.2% IN NS 100 ML EPIDURAL INFUSION (ARMC-ANES): 10.0000 mL/h | EPIDURAL | Status: DC

## 2016-03-17 MED ORDER — SENNOSIDES-DOCUSATE SODIUM 8.6-50 MG PO TABS: 2.0000 | ORAL_TABLET | ORAL | Status: DC

## 2016-03-17 MED ORDER — DIPHENHYDRAMINE HCL 25 MG PO CAPS: 25.0000 mg | ORAL_CAPSULE | ORAL | Status: DC | PRN

## 2016-03-17 MED ORDER — FENTANYL 2.5 MCG/ML W/ROPIVACAINE 0.2% IN NS 100 ML EPIDURAL INFUSION (ARMC-ANES)
EPIDURAL | Status: AC
  Filled 2016-03-17: qty 100

## 2016-03-17 MED ORDER — LIDOCAINE HCL (PF) 1 % IJ SOLN
INTRAMUSCULAR | Status: DC | PRN
  Administered 2016-03-17: .8 mL

## 2016-03-17 MED ORDER — DIPHENHYDRAMINE HCL 25 MG PO CAPS: 25.0000 mg | ORAL_CAPSULE | Freq: Four times a day (QID) | ORAL | Status: DC | PRN

## 2016-03-17 MED ORDER — COCONUT OIL OIL: 1.0000 "application " | TOPICAL_OIL | Status: DC | PRN

## 2016-03-17 MED ORDER — IBUPROFEN 600 MG PO TABS
600.0000 mg | ORAL_TABLET | Freq: Four times a day (QID) | ORAL | Status: DC
  Administered 2016-03-17 – 2016-03-18 (×4): 600 mg via ORAL
  Filled 2016-03-17 (×4): qty 1

## 2016-03-17 MED ORDER — BENZOCAINE-MENTHOL 20-0.5 % EX AERO
1.0000 "application " | INHALATION_SPRAY | CUTANEOUS | Status: DC | PRN
  Administered 2016-03-17 – 2016-03-18 (×2): 1 via TOPICAL
  Filled 2016-03-17 (×2): qty 56

## 2016-03-17 NOTE — Discharge Instructions (Signed)

## 2016-03-17 NOTE — Discharge Summary (Signed)
Obstetrical Discharge Summary  Date of Admission: 03/15/2016 Date of Discharge: 03/18/16  Discharge Diagnosis: Term Pregnancy-delivered and Gestational Hypertension Primary OB:  [facility]   Gestational Age at Delivery: 5275w6d  Antepartum complications: Gest HTN Date of Delivery: 03/18/2016  Delivered By: Annamarie MajorPaul Harris, MD  Delivery Note Primary OB: [facility] Delivery Physician: Annamarie MajorPaul Harris, MD Gestational Age: Full term Antepartum complications: Gestational Hypertension Intrapartum complications: None  A viable Female was delivered via vertex perentation.  Apgars:8 ,9  Weight:  pending .   Placenta status: spontaneous and Intact.  Cord: 3+ vessels;  with the following complications: none.  Anesthesia:  epidural Episiotomy:  midline without extension Lacerations:  none Suture Repair: 2.0 vicryl Est. Blood Loss (mL):  200 mL  Mom to postpartum.  Baby to Couplet care / Skin to Skin.  Delivery Type: spontaneous vaginal delivery  Post partum course: Since the delivery, patient has tolerate activity, diet, and daily functions without difficulty or complication.  Min lochia.  No breast concerns at this time.  No signs of depression currently.   Postpartum Exam:General appearance: alert, appears stated age and no distress GI: Fundus firm Extremities: extremities normal, atraumatic, no cyanosis or edema  Disposition: home with infant Rh Immune globulin given: not applicable Rubella vaccine given: not applicable Varicella vaccine given: yes Tdap vaccine given in AP or PP setting: given during prenatal care Flu vaccine given in AP or PP setting: given during prenatal care Contraception: vasectomy  Prenatal Labs: A POS//Rubella Immune//RPR negative//HIV negative/HepB Surface Ag negative//plans to breastfeed  Plan:  Angela Weaver was discharged to home in good condition. [facility]  Discharge Medications: Allergies as of 03/18/2016       Reactions   Coconut Flavor Hives, Itching   Sulfa Antibiotics Hives   Codone [hydrocodone] Anxiety      Medication List    STOP taking these medications   aspirin 81 MG chewable tablet     TAKE these medications   ferrous sulfate 325 (65 FE) MG tablet Commonly known as:  FERROUSUL Take 1 tablet (325 mg total) by mouth daily with breakfast.   ibuprofen 600 MG tablet Commonly known as:  ADVIL,MOTRIN Take 1 tablet (600 mg total) by mouth every 6 (six) hours.   labetalol 100 MG tablet Commonly known as:  NORMODYNE Take 100 mg by mouth 2 (two) times daily.   prenatal vitamin w/FE, FA 27-1 MG Tabs tablet Take 1 tablet by mouth daily at 12 noon.

## 2016-03-17 NOTE — Progress Notes (Signed)
[facility] to BR for post delivery void.  Pt. Voided small amount of urine, pericare given, ice pack applied.  Pt. With steady gait.  Pt. Ready for transfer to postpartum unit.  Pt. Transferred to room 341 with infant in [place].

## 2016-03-17 NOTE — Progress Notes (Signed)
Discussed plan of care with Dr. Tiburcio PeaHarris.  Plan to continue to increase Pitocin as indicated up to 6320mu/min.  Notify MD if patient progressing.  If no change noted after dose of 3620mu/min, discontinue Pitocin for 2-3 hours. Patient may eat a light meal x1.

## 2016-03-17 NOTE — Progress Notes (Signed)
Difficult to accurately evaluate contractions due to patient movement.  RN to bedside to palpate and adjust toco.  Contractions palpated approximately every 2-3 minutes.

## 2016-03-17 NOTE — Anesthesia Procedure Notes (Signed)
Epidural Patient location during procedure: OB Start time: 03/17/2016 6:10 AM End time: 03/17/2016 6:36 AM  Staffing Performed: anesthesiologist   Preanesthetic Checklist Completed: patient identified, site marked, surgical consent, pre-op evaluation, timeout performed, IV checked, risks and benefits discussed and monitors and equipment checked  Epidural Patient position: sitting Prep: Betadine Patient monitoring: heart rate, continuous pulse ox and blood pressure Approach: midline Location: L4-L5 Injection technique: LOR saline  Needle:  Needle type: Tuohy  Needle gauge: 17 G Needle length: 9 cm and 9 Needle insertion depth: 8 cm Catheter type: closed end flexible Catheter size: 20 Guage Catheter at skin depth: 10 cm Test dose: negative and 1.5% lidocaine with Epi 1:200 K  Assessment Events: blood not aspirated, injection not painful, no injection resistance, negative IV test and no paresthesia  Additional Notes   Patient tolerated the insertion well without complications.Reason for block:procedure for pain

## 2016-03-17 NOTE — Progress Notes (Signed)
  Labor Progress Note   30 y.o. [id number] @ 4332w6d , admitted for  Pregnancy, Labor Management. IOL GEST HTN.  Subjective:  Epidural now in place Pitocin, contracting regularly AROM now, clear  Objective:  BP 137/89   Pulse 72   Temp 97.7 F (36.5 C) (Oral)   Resp 18   Ht 5\' 6"  (1.676 m)   Wt 171 lb (77.6 kg)   SpO2 99%   BMI 27.60 kg/m  Abd: min pain, gravid, Vtx Extr: trace to 1+ bilateral pedal edema SVE: 4/80/-3  EFM: FHR: 140 bpm, variability: moderate,  accelerations:  Present,  decelerations:  Absent Toco: Frequency: Every 2-4 minutes  Assessment & Plan:  [id number] @ 5432w6d, admitted for  Pregnancy and Labor/Delivery Management due to Gestational HTN  1. Pain management: epidural. 2. FWB: FHT category 1.  3. ID: GBS negative 4. Labor management: Pitocin.  AROM.  HTN stable and under control.  All discussed with patient, see orders

## 2016-03-17 NOTE — Anesthesia Preprocedure Evaluation (Signed)
Anesthesia Evaluation  Patient identified by MRN, date of birth, ID band Patient awake    Reviewed: Allergy & Precautions, Patient's Chart, lab work & pertinent test results  History of Anesthesia Complications Negative for: history of anesthetic complications  Airway Mallampati: II       Dental   Pulmonary neg pulmonary ROS, former smoker,           Cardiovascular hypertension, Pt. on medications and Pt. on home beta blockers      Neuro/Psych negative neurological ROS     GI/Hepatic Neg liver ROS, GERD (with pregnancy)  Medicated,  Endo/Other  negative endocrine ROS  Renal/GU negative Renal ROS     Musculoskeletal   Abdominal   Peds  Hematology  (+) anemia ,   Anesthesia Other Findings   Reproductive/Obstetrics (+) Pregnancy                             Anesthesia Physical Anesthesia Plan  ASA: II  Anesthesia Plan: Epidural   Post-op Pain Management:    Induction:   Airway Management Planned:   Additional Equipment:   Intra-op Plan:   Post-operative Plan:   Informed Consent: I have reviewed the patients History and Physical, chart, labs and discussed the procedure including the risks, benefits and alternatives for the proposed anesthesia with the patient or authorized representative who has indicated his/her understanding and acceptance.     Plan Discussed with:   Anesthesia Plan Comments:         Anesthesia Quick Evaluation

## 2016-03-18 LAB — CBC
HEMATOCRIT: 23.3 % — AB (ref 35.0–47.0)
Hemoglobin: 8.1 g/dL — ABNORMAL LOW (ref 12.0–16.0)
MCH: 27.8 pg (ref 26.0–34.0)
MCHC: 34.8 g/dL (ref 32.0–36.0)
MCV: 79.8 fL — AB (ref 80.0–100.0)
PLATELETS: 142 10*3/uL — AB (ref 150–440)
RBC: 2.93 MIL/uL — ABNORMAL LOW (ref 3.80–5.20)
RDW: 13.5 % (ref 11.5–14.5)
WBC: 9.2 10*3/uL (ref 3.6–11.0)

## 2016-03-18 MED ORDER — VARICELLA VIRUS VACCINE LIVE 1350 PFU/0.5ML IJ SUSR
0.5000 mL | Freq: Once | INTRAMUSCULAR | Status: AC
  Administered 2016-03-18: 0.5 mL via SUBCUTANEOUS
  Filled 2016-03-18: qty 0.5

## 2016-03-18 MED ORDER — IBUPROFEN 600 MG PO TABS: 600.0000 mg | ORAL_TABLET | Freq: Four times a day (QID) | ORAL | 0 refills | Status: DC

## 2016-03-18 MED ORDER — FERROUS SULFATE 325 (65 FE) MG PO TABS: 325.0000 mg | ORAL_TABLET | Freq: Every day | ORAL | 1 refills | Status: DC

## 2016-03-18 NOTE — Anesthesia Postprocedure Evaluation (Signed)
Anesthesia Post Note  Patient: Angela Weaver  Procedure(s) Performed: * No procedures listed *  Patient location during evaluation: Mother Baby Anesthesia Type: Epidural Level of consciousness: awake and alert Pain management: pain level controlled Vital Signs Assessment: post-procedure vital signs reviewed and stable Respiratory status: spontaneous breathing, nonlabored ventilation and respiratory function stable Cardiovascular status: stable Postop Assessment: no headache, no backache and patient able to bend at knees Comments: Patient reports some resolving tingling in right thigh that she says is much improved from yesterday and almost completely resolved.  She reports no motor deficits and reports being able to ambulate without any problems.   At this time I feel that this may be from labor positioning and should resolve, I explained this to her.  She was instructed to contact us or another doctor if this does not get better or gets worse.  She voiced understanding.     Last Vitals:  Vitals:   03/18/16 0335 03/18/16 0719  BP: 127/88 119/86  Pulse: 88 89  Resp: 20 18  Temp: 36.6 C 36.6 C    Last Pain:  Vitals:   03/18/16 0739  TempSrc:   PainSc: 3                  Cleda MccreedyJoseph K Piscitello

## 2016-03-18 NOTE — Lactation Note (Signed)
Lactation Consultation Note  Patient Name: Angela Weaver NWGNF'AToday's Date: 03/18/2016     Maternal Data   Did not observe feeding, mom for d/c, states baby is breastfeeding well, breast fed her last child for 22 mths., no nipple tenderness, has new Medela pump and style at home. Feeding    LATCH Score/Interventions                      Lactation Tools Discussed/Used     Consult Status      Dyann KiefMarsha D Jaidan Prevette 03/18/2016, 11:57 AM

## 2016-03-18 NOTE — Progress Notes (Signed)
All discharge instructions given to patient and she voices understanding of all instructions given .prescriptions given. Patient will make her own f/u appt. paitent discharged home with infant escorted out by staff

## 2016-07-04 ENCOUNTER — Ambulatory Visit: Payer: Self-pay | Admitting: Obstetrics & Gynecology

## 2016-07-25 ENCOUNTER — Ambulatory Visit: Payer: Self-pay | Admitting: Obstetrics & Gynecology

## 2016-08-29 ENCOUNTER — Ambulatory Visit: Payer: Self-pay | Admitting: Obstetrics & Gynecology

## 2017-01-23 ENCOUNTER — Encounter: Payer: Self-pay | Admitting: Emergency Medicine

## 2017-01-23 ENCOUNTER — Ambulatory Visit
Admission: EM | Admit: 2017-01-23 | Discharge: 2017-01-23 | Disposition: A | Discharge status: Home / Self Care | Payer: BLUE CROSS/BLUE SHIELD | Attending: Family Medicine | Admitting: Family Medicine

## 2017-01-23 DIAGNOSIS — R05 Cough: Secondary | ICD-10-CM

## 2017-01-23 DIAGNOSIS — J069 Acute upper respiratory infection, unspecified: Secondary | ICD-10-CM | POA: Diagnosis not present

## 2017-01-23 DIAGNOSIS — B9789 Other viral agents as the cause of diseases classified elsewhere: Secondary | ICD-10-CM

## 2017-01-23 LAB — RAPID STREP SCREEN (MED CTR MEBANE ONLY): STREPTOCOCCUS, GROUP A SCREEN (DIRECT): NEGATIVE

## 2017-01-23 MED ORDER — BENZONATATE 100 MG PO CAPS: 100.0000 mg | ORAL_CAPSULE | Freq: Three times a day (TID) | ORAL | 0 refills | Status: DC | PRN

## 2017-01-23 NOTE — ED Provider Notes (Signed)
[facility]    CSN: [id number] Arrival date & time: 01/23/17  4098  History   Chief Complaint Chief Complaint  Patient presents with  . Cough  . Sore Throat   HPI  31 year old female presents with the above complaints.  Patient reports that she has been sick since Friday.  She has had sore throat, congestion, cough.  Associated hoarseness.  Cough is her most troublesome complaint.  No associated fever.  No shortness of breath.  No known exacerbating relieving factors.  Symptoms are moderate in severity.  No known relieving factors.  No other associated symptoms.  No other complaints at this time.  Past Medical History:  Diagnosis Date  . Anemia   . Hypertension   . Migraine   . Vaginal Pap smear, abnormal    LGSIL   Patient Active Problem List   Diagnosis Date Noted  . Hypertension in pregnancy, antepartum 03/15/2016  . Amniotic fluid leaking 03/09/2016   Past Surgical History:  Procedure Laterality Date  . KNEE SURGERY    . TONSILLECTOMY    . WISDOM TOOTH EXTRACTION     OB History    Gravida Para Term Preterm AB Living   4 3 3   1 2    SAB TAB Ectopic Multiple Live Births   1     0 2     Home Medications    Prior to Admission medications   Medication Sig Start Date End Date Taking? Authorizing Provider  benzonatate (TESSALON) 100 MG capsule Take 1 capsule (100 mg total) by mouth 3 (three) times daily as needed for cough. 01/23/17   Tommie Sams, DO  ibuprofen (ADVIL,MOTRIN) 600 MG tablet Take 1 tablet (600 mg total) by mouth every 6 (six) hours. 03/18/16   Vena Austria, MD  labetalol (NORMODYNE) 100 MG tablet Take 100 mg by mouth 2 (two) times daily.    [provider]  prenatal vitamin w/FE, FA (PRENATAL 1 + 1) 27-1 MG TABS tablet Take 1 tablet by mouth daily at 12 noon.    [provider]   Family History Family History  Problem Relation Age of Onset  . Family history unknown: Yes   Social History Social History    Substance Use Topics  . Smoking status: Former Games developer  . Smokeless tobacco: Never Used  . Alcohol use No   Allergies   Coconut flavor; Sulfa antibiotics; Codeine; and Codone [hydrocodone]  Review of Systems Review of Systems  Constitutional: Negative for fever.  HENT: Positive for congestion, sore throat and voice change.   Respiratory: Positive for cough.   All other systems reviewed and are negative.  Physical Exam Triage Vital Signs ED Triage Vitals  Enc Vitals Group     BP 01/23/17 0945 (!) 141/98     Pulse Rate 01/23/17 0945 97     Resp 01/23/17 0945 14     Temp 01/23/17 0945 98.3 F (36.8 C)     Temp Source 01/23/17 0945 Oral     SpO2 01/23/17 0945 99 %     Weight 01/23/17 0941 151 lb (68.5 kg)     Height 01/23/17 0941 5\' 6"  (1.676 m)     Head Circumference --      Peak Flow --      Pain Score 01/23/17 0941 5     Pain Loc --      Pain Edu? --      Excl. in GC? --   Updated Vital Signs BP (!) 141/98 (  BP Location: Left Arm)   Pulse 97   Temp 98.3 F (36.8 C) (Oral)   Resp 14   Ht 5\' 6"  (1.676 m)   Wt 151 lb (68.5 kg)   SpO2 99%   Breastfeeding? Yes Comment: Baby allergic to Amoxicillin  BMI 24.37 kg/m   Physical Exam  Constitutional: She is oriented to person, place, and time. She appears well-developed. No distress.  HENT:  Head: Normocephalic and atraumatic.  Mouth/Throat: Oropharynx is clear and moist.  Normal TM's bilaterally.  Eyes: Conjunctivae are normal. No scleral icterus.  Neck: Neck supple.  Cardiovascular: Normal rate and regular rhythm.   No murmur heard. Pulmonary/Chest: Effort normal and breath sounds normal. She has no wheezes. She has no rales.  Lymphadenopathy:    She has no cervical adenopathy.  Neurological: She is alert and oriented to person, place, and time.  Skin: Skin is warm. No rash noted.  Psychiatric: She has a normal mood and affect.  Vitals reviewed.  UC Treatments / Results  Labs (all labs ordered are listed, but  only abnormal results are displayed) Labs Reviewed  RAPID STREP SCREEN (NOT AT [facility])  CULTURE, GROUP A STREP [facility])    EKG  EKG Interpretation None       Radiology No results found.  Procedures Procedures (including critical care time)  Medications Ordered in UC Medications - No data to display   Initial Impression / Assessment and Plan / UC Course  I have reviewed the triage vital signs and the nursing notes.  Pertinent labs & imaging results that were available during my care of the patient were reviewed by me and considered in my medical decision making (see chart for details).     31 year old female presents with a viral respiratory illness.  Supportive care and Tessalon as needed.  She is currently breast-feeding.  Tessalon okay to be used sparingly with breast-feeding.  Final Clinical Impressions(s) / UC Diagnoses   Final diagnoses:  Viral URI with cough    New Prescriptions New Prescriptions   BENZONATATE (TESSALON) 100 MG CAPSULE    Take 1 capsule (100 mg total) by mouth 3 (three) times daily as needed for cough.   Controlled Substance Prescriptions [place] Controlled Substance Registry consulted? Not Applicable   Tommie SamsCook, Chandani Rogowski G, DO 01/23/17 1007

## 2017-01-23 NOTE — ED Triage Notes (Signed)
Patient c/o sore throat, cough, congestion since Friday.

## 2017-01-25 LAB — CULTURE, GROUP A STREP (THRC)

## 2017-03-30 ENCOUNTER — Encounter: Payer: Self-pay | Admitting: Obstetrics and Gynecology

## 2017-03-30 ENCOUNTER — Ambulatory Visit (INDEPENDENT_AMBULATORY_CARE_PROVIDER_SITE_OTHER): Payer: BLUE CROSS/BLUE SHIELD | Admitting: Obstetrics and Gynecology

## 2017-03-30 VITALS — BP 122/84 | HR 92 | Wt 156.0 lb

## 2017-03-30 DIAGNOSIS — O219 Vomiting of pregnancy, unspecified: Secondary | ICD-10-CM

## 2017-03-30 DIAGNOSIS — N912 Amenorrhea, unspecified: Secondary | ICD-10-CM

## 2017-03-30 DIAGNOSIS — Z124 Encounter for screening for malignant neoplasm of cervix: Secondary | ICD-10-CM

## 2017-03-30 DIAGNOSIS — O10919 Unspecified pre-existing hypertension complicating pregnancy, unspecified trimester: Secondary | ICD-10-CM

## 2017-03-30 DIAGNOSIS — O0991 Supervision of high risk pregnancy, unspecified, first trimester: Secondary | ICD-10-CM

## 2017-03-30 LAB — POCT URINE PREGNANCY: Preg Test, Ur: POSITIVE — AB

## 2017-03-30 MED ORDER — DOXYLAMINE-PYRIDOXINE 10-10 MG PO TBEC: 2.0000 | DELAYED_RELEASE_TABLET | Freq: Every day | ORAL | 5 refills | Status: DC

## 2017-03-30 MED ORDER — LABETALOL HCL 200 MG PO TABS: 200.0000 mg | ORAL_TABLET | Freq: Two times a day (BID) | ORAL | 3 refills | Status: DC

## 2017-03-30 NOTE — Progress Notes (Signed)
03/30/2017   Chief Complaint: Missed period  Transfer of Care Patient: no  History of Present Illness: [name] is a 32 y.o. [id number] 6747w1d based on Patient's last menstrual period was 02/08/2017 (exact date). with an Estimated Date of Delivery: 11/15/17, with the above CC.   Her periods were: regular periods every 28 days She was using no method when she conceived.  She has Positive signs or symptoms of nausea/vomiting of pregnancy. She has Negative signs or symptoms of miscarriage or preterm labor She identifies Negative Zika risk factors for her and her partner On any different medications around the time she conceived/early pregnancy: No    ROS: A 12-point review of systems was performed and negative, except as stated in the above HPI.  OBGYN History: As per HPI. OB History  Gravida Para Term Preterm AB Living  5 3 3   1 3   SAB TAB Ectopic Multiple Live Births  1     0 3    # Outcome Date GA Lbr Len/2nd Weight Sex Delivery Anes PTL Lv  5 Current           4 Term 03/17/16 6478w6d  7 lb 10.1 oz (3.46 kg) M Vag-Spont EPI  LIV  3 Term 01/25/13 5167w0d  7 lb 10 oz (3.459 kg) M Vag-Spont   LIV  2 SAB 09/2009          1 Term 11/08/04 6153w0d  7 lb 8 oz (3.402 kg) M Vag-Spont   LIV      Any issues with any prior pregnancies: no Any prior children are healthy, doing well, without any problems or issues: yes History of pap smears: Yes. Last pap smear 2017. Abnormal: yes LSIL HPV negative, No CIN identified on biopsy. History of STIs: No   Past Medical History: Past Medical History:  Diagnosis Date  . Anemia   . Hypertension   . Migraine   . Vaginal Pap smear, abnormal    LGSIL    Past Surgical History: Past Surgical History:  Procedure Laterality Date  . KNEE SURGERY    . TONSILLECTOMY    . WISDOM TOOTH EXTRACTION      Family History:  Family History  Problem Relation Age of Onset  . Melanoma Maternal Aunt 50  . Melanoma Maternal Grandmother 72  . Diabetes  Maternal Grandfather    She denies any female cancers, bleeding or blood clotting disorders.  She denies any history of mental retardation, birth defects or genetic disorders in her or the FOB's history  Social History:  Social History   Socioeconomic History  . Marital status: Married    Spouse name: Not on file  . Number of children: Not on file  . Years of education: Not on file  . Highest education level: Not on file  Social Needs  . Financial resource strain: Not on file  . Food insecurity - worry: Not on file  . Food insecurity - inability: Not on file  . Transportation needs - medical: Not on file  . Transportation needs - non-medical: Not on file  Occupational History  . Not on file  Tobacco Use  . Smoking status: Former Games developermoker  . Smokeless tobacco: Never Used  Substance and Sexual Activity  . Alcohol use: No  . Drug use: No  . Sexual activity: Yes    Birth control/protection: None  Other Topics Concern  . Not on file  Social History Narrative  . Not on file   Any pets in the  household: No cats, discussed not changing cat litter.   Allergy: Allergies  Allergen Reactions  . Coconut Flavor Hives and Itching  . Sulfa Antibiotics Hives and Other (See Comments)    Told by mother    . Coconut Oil Rash  . Codeine Rash and Other (See Comments)    Been told by mother    . Codone [Hydrocodone] Anxiety    Current Outpatient Medications:  Current Outpatient Medications:  .  metoprolol succinate (TOPROL-XL) 25 MG 24 hr tablet, Take 25 mg by mouth daily., Disp: , Rfl:  .  prenatal vitamin w/FE, FA (PRENATAL 1 + 1) 27-1 MG TABS tablet, Take 1 tablet by mouth daily at 12 noon., Disp: , Rfl:  .  Doxylamine-Pyridoxine (DICLEGIS) 10-10 MG TBEC, Take 2 tablets by mouth at bedtime. If symptoms persist, add one tablet in the morning and one in the afternoon, Disp: 100 tablet, Rfl: 5 .  labetalol (NORMODYNE) 200 MG tablet, Take 1 tablet (200 mg total) by mouth 2 (two) times  daily., Disp: 60 tablet, Rfl: 3   Physical Exam:   BP 122/84   Pulse 92   Wt 156 lb (70.8 kg)   LMP 02/08/2017 (Exact Date)   BMI 25.18 kg/m  Body mass index is 25.18 kg/m. Constitutional: Well nourished, well developed female in no acute distress.  Neck:  Supple, normal appearance, and no thyromegaly  Cardiovascular: S1, S2 normal, no murmur, rub or gallop, regular rate and rhythm Respiratory:  Clear to auscultation bilateral. Normal respiratory effort Abdomen: positive bowel sounds and no masses, hernias; diffusely non tender to palpation, non distended Breasts: breasts appear normal, no suspicious masses, no skin or nipple changes or axillary nodes. Neuro/Psych:  Normal mood and affect.  Skin:  Warm and dry.  Lymphatic:  No inguinal lymphadenopathy.   Pelvic exam: is not limited by body habitus EGBUS: within normal limits, Vagina: within normal limits and with no blood in the vault, Cervix: normal appearing cervix without discharge or lesions, closed/long/high, Uterus:  nonenlarged, and Adnexa:  normal adnexa   Bedside transvaginal US showed a gestational sac and yolk sac. No fetal pole.  Assessment: [name] is a 32 y.o. Z6X0960 [redacted]w[redacted]d based on Patient's last menstrual period was 02/08/2017 (exact date). with an Estimated Date of Delivery: 11/15/17,  for prenatal care.  Plan:  1) Avoid alcoholic beverages. 2) Patient encouraged not to smoke.  3) Discontinue the use of all non-medicinal drugs and chemicals.  4) Take prenatal vitamins daily.  5) Seatbelt use advised 6) Nutrition, food safety (fish, cheese advisories, and high nitrite foods) and exercise discussed. 7) Hospital and practice style delivering at [facility] discussed  8) Patient is asked about travel to areas at risk for the Zika virus, and counseled to avoid travel and exposure to mosquitoes or sexual partners who may have themselves been exposed to the virus. Testing is discussed, and will be ordered as  appropriate.  9) Childbirth classes at [facility] advised 10) Genetic Screening, such as with 1st Trimester Screening, cell free fetal DNA, AFP testing, and Ultrasound, as well as with amniocentesis and CVS as appropriate, is discussed with patient. She plans to have genetic testing this pregnancy. Desires NIPT testing.  11) HTN medication changed to labetalol 200mg  BID. Discussed initiation of ASA therapy at [redacted] week gestation. 12) Nausea and vomiting, ordered diclegis 13) Patient actively breastfeeding child who is about 1 year. She would like to continue to breast feed until he is 15 months. Discussed risk of uterine  cramping and encouraged the patient to get adequate rest hydration and nutrition so that she can continue to support both a pregnancy and breastfeeding.   14) Bedside US showed a gestational sac and yolk sac. Return in 2 weeks for dating [place].   Problem list reviewed and updated.  Adelene Idler MD [facility], [facility] 03/30/2017  2:07 PM

## 2017-03-31 LAB — PROTEIN / CREATININE RATIO, URINE
Creatinine, Urine: 124.4 mg/dL
PROTEIN UR: 6.8 mg/dL
PROTEIN/CREAT RATIO: 55 mg/g{creat} (ref 0–200)

## 2017-04-01 LAB — URINE CULTURE: Organism ID, Bacteria: NO GROWTH

## 2017-04-06 LAB — URINE DRUG PANEL 7
Amphetamines, Urine: NEGATIVE ng/mL
BARBITURATE QUANT UR: NEGATIVE ng/mL
BENZODIAZEPINE QUANT UR: NEGATIVE ng/mL
Cannabinoid Quant, Ur: NEGATIVE ng/mL
Cocaine (Metab.): NEGATIVE ng/mL
Opiate Quant, Ur: NEGATIVE ng/mL
PCP QUANT UR: NEGATIVE ng/mL

## 2017-04-07 LAB — PAPIG, CTNGTV, HPV, RFX 16/18
CHLAMYDIA, NUC. ACID AMP: NEGATIVE
Gonococcus, Nuc. Acid Amp: NEGATIVE
HPV GENOTYPE, 16: NEGATIVE
HPV GENOTYPE, 18: NEGATIVE
HPV, high-risk: POSITIVE — AB
PAP SMEAR COMMENT: 0
Trich vag by NAA: NEGATIVE

## 2017-04-10 NOTE — Progress Notes (Signed)
Called patient and discussed results, will need pap smear in 1 year

## 2017-04-14 ENCOUNTER — Ambulatory Visit (INDEPENDENT_AMBULATORY_CARE_PROVIDER_SITE_OTHER): Payer: BLUE CROSS/BLUE SHIELD | Admitting: Obstetrics & Gynecology

## 2017-04-14 ENCOUNTER — Other Ambulatory Visit: Payer: Self-pay | Admitting: Obstetrics and Gynecology

## 2017-04-14 ENCOUNTER — Ambulatory Visit (INDEPENDENT_AMBULATORY_CARE_PROVIDER_SITE_OTHER): Payer: BLUE CROSS/BLUE SHIELD

## 2017-04-14 VITALS — BP 140/90 | Wt 157.0 lb

## 2017-04-14 DIAGNOSIS — O0991 Supervision of high risk pregnancy, unspecified, first trimester: Secondary | ICD-10-CM

## 2017-04-14 DIAGNOSIS — O039 Complete or unspecified spontaneous abortion without complication: Secondary | ICD-10-CM | POA: Diagnosis not present

## 2017-04-14 DIAGNOSIS — Z362 Encounter for other antenatal screening follow-up: Secondary | ICD-10-CM

## 2017-04-14 NOTE — Progress Notes (Signed)
Obstetric Problem Visit   Chief Complaint:  Chief Complaint  Patient presents with  . Early pregnancy   History of Present Illness: Patient is a 32 y.o. [id number] [redacted]w[redacted]d presenting for first trimester ultrasound for dating purposes.  She has had nausea but no other complaints; no bleeding or pain.  Reg cycles prior to conception.  PMHx: She  has a past medical history of Anemia, Hypertension, Migraine, and Vaginal Pap smear, abnormal. Also,  has a past surgical history that includes Knee surgery; Tonsillectomy; and Wisdom tooth extraction., family history includes Diabetes in her maternal grandfather; Melanoma (age of onset: 87) in her maternal aunt; Melanoma (age of onset: 27) in her maternal grandmother.,  reports that she has quit smoking. she has never used smokeless tobacco. She reports that she does not drink alcohol or use drugs.  She has a current medication list which includes the following prescription(s): doxylamine-pyridoxine, labetalol, metoprolol succinate, and prenatal vitamin w/fe, fa. Also, is allergic to coconut flavor; sulfa antibiotics; coconut oil; codeine; and codone [hydrocodone].  Review of Systems  Constitutional: Negative for chills, fever and malaise/fatigue.  HENT: Negative for congestion, sinus pain and sore throat.   Eyes: Negative for blurred vision and pain.  Respiratory: Negative for cough and wheezing.   Cardiovascular: Negative for chest pain and leg swelling.  Gastrointestinal: Negative for abdominal pain, constipation, diarrhea, heartburn, nausea and vomiting.  Genitourinary: Negative for dysuria, frequency, hematuria and urgency.  Musculoskeletal: Negative for back pain, joint pain, myalgias and neck pain.  Skin: Negative for itching and rash.  Neurological: Negative for dizziness, tremors and weakness.  Endo/Heme/Allergies: Does not bruise/bleed easily.  Psychiatric/Behavioral: Negative for depression. The patient is not nervous/anxious and does not have  insomnia.     Objective: Vitals:   04/14/17 1446  BP: 140/90   Physical Exam  Constitutional: She is oriented to person, place, and time. She appears well-developed and well-nourished. No distress.  Musculoskeletal: Normal range of motion.  Neurological: She is alert and oriented to person, place, and time.  Skin: Skin is warm and dry.  Psychiatric: She has a normal mood and affect.  Vitals reviewed.  Results for orders placed or performed in visit on 03/30/17  Urine Culture  Result Value Ref Range   Urine Culture, Routine Final report    Organism ID, Bacteria No growth   Urine Drug Panel 7  Result Value Ref Range   Amphetamines, Urine Negative Cutoff=1000 ng/mL   Barbiturate Quant, Ur Negative Cutoff=300 ng/mL   Benzodiazepine Quant, Ur Negative Cutoff=300 ng/mL   Cannabinoid Quant, Ur Negative Cutoff=50 ng/mL   Cocaine (Metab.) Negative Cutoff=300 ng/mL   Opiate Quant, Ur Negative Cutoff=300 ng/mL   PCP Quant, Ur Negative Cutoff=25 ng/mL  Protein / creatinine ratio, urine  Result Value Ref Range   Creatinine, Urine 124.4 Not Estab. mg/dL   Protein, Ur 6.8 Not Estab. mg/dL   Protein/Creat Ratio 55 0 - 200 mg/g creat  POCT urine pregnancy  Result Value Ref Range   Preg Test, Ur Positive (A) Negative  PapIG, CtNgTv, HPV, rfx 16/18  Result Value Ref Range   DIAGNOSIS: Comment    Specimen adequacy: Comment    Clinician Provided ICD10 Comment    Performed by: Comment    PAP Smear Comment .    Note: Comment    Test Methodology Comment    HPV, high-risk Positive (A) Negative   HPV Genotype, 16 Negative Negative   HPV Genotype, 18 Negative Negative   Chlamydia, Nuc. Acid Amp Negative  Negative   Gonococcus, Nuc. Acid Amp Negative Negative   Trich vag by NAA Negative Negative    Assessment: 32 y.o. [id number] 810w2d 1. Fetal demise due to miscarriage - US OB Transvaginal; Future for confirmation and patient understanding  "Society of Radiology[employer] in Ultrasound Guidelines  for Transvaginal Ultrasonographic Diagnosis of Early Pregnancy Loss" and adopted in ACOG Practice Bulletin Number 150, May 2015 (reaffirmed 2017) "Early Pregnancy Loss"  - CRL of 7mm or greater and absence of fetal heartbeat  - Mean sac diameter of 25mm or greater and no embryo  - Absence of embryo with a discernable heartbeat 2 week after initial ultrasound showing a gestational sac without a yolk sac  2.  Condolences were offered to the patient and her family.  I stressed that while emotionally difficult, that this did not occur because of an actions or inactions by the patient.  Somewhere between 10-20% of identified first trimester pregnancies will unfortunately end in miscarriage.  Given this relatively high incidence rate, further diagnostic testing such as chromosome analysis is generally not clinically relevant nor recommended.  Although the chromosomal abnormalities have been implicated at rates as high as 70% in some studies, these are generally random and do not infer and increased risk of recurrence with subsequent pregnancies.  However, 3 or more consecutive first trimester losses are relatively uncommon, and these patient generally do benefit from additional work up to determine a potential modifiable etiology.              We briefly discussed management options including expectant management, medical management, and surgical management as well as their relative success rates and complications. Approximately 80% of first trimester miscarriages will pass successfully but may require a time frame of up to 8 weeks (ACOG Practice Bulletin 150 May 2015 "Early Pregnancy Loss").  Medical management has literature supporting its use up to 63 days or 6332w0d gestation and results in a passage rate of 84-85% Environmental education officer(ACOG Practice Bulletin 143 March 2014 "Medical Management of First-Trimester Abortion").  Dilation and curettage has the highest rate of uterine evacuation, but carries with is operative cost,  surgical and anesthetic risk.  While these risk are relatively small they nevertheless include infection, bleeding, uterine perforation, formation of uterine synechia, and in rare cases death.              We discussed repeat ultrasound and or trending HCG levels if the patient wishes to pursue these prior to making her decision.  Clinically I am confident of the diagnosis, but I do not want any doubts in the patient's mind regarding the plan of management she chooses to adopt. The patient elects to proceed with surgical management for her missed abortion. I have discussed with the patient the indications for the procedure. Included in the discussion were the options of therapy, as wall as their individual risks, benefits, and complications. Ample time was given to answer all questions.   While the incidence is low, the risks from this surgery include, but are not limited to, the risks of anesthesia, hemorrhage, infection, perforation, and injury to adjacent structures including bowel, bladder and blood vessels.   3. Routine bleeding precautions were discussed with the patient prior the conclusion of today's visit.  Annamarie MajorPaul Oval Moralez, MD, Merlinda FrederickFACOG [facility], [facility] 04/14/2017  3:03 PM

## 2017-04-14 NOTE — Patient Instructions (Signed)

## 2017-04-21 ENCOUNTER — Ambulatory Visit (INDEPENDENT_AMBULATORY_CARE_PROVIDER_SITE_OTHER): Payer: BLUE CROSS/BLUE SHIELD | Admitting: Obstetrics & Gynecology

## 2017-04-21 ENCOUNTER — Ambulatory Visit (INDEPENDENT_AMBULATORY_CARE_PROVIDER_SITE_OTHER): Payer: BLUE CROSS/BLUE SHIELD

## 2017-04-21 VITALS — BP 140/90 | Wt 158.0 lb

## 2017-04-21 DIAGNOSIS — O021 Missed abortion: Secondary | ICD-10-CM | POA: Diagnosis not present

## 2017-04-21 DIAGNOSIS — O039 Complete or unspecified spontaneous abortion without complication: Secondary | ICD-10-CM

## 2017-04-21 NOTE — Patient Instructions (Signed)
Dilation and Curettage or Vacuum Curettage, Care After °This sheet gives you information about how to care for yourself after your procedure. Your health care provider may also give you more specific instructions. If you have problems or questions, contact your health care provider. °What can I expect after the procedure? °After your procedure, it is common to have: °· Mild pain or cramping. °· Some vaginal bleeding or spotting. ° °These may last for up to 2 weeks after your procedure. °Follow these instructions at home: °Activity ° °· Do not drive or use heavy machinery while taking prescription pain medicine. °· Avoid driving for the first 24 hours after your procedure. °· Take frequent, short walks, followed by rest periods, throughout the day. Ask your health care provider what activities are safe for you. After 1-2 days, you may be able to return to your normal activities. °· Do not lift anything heavier than 10 lb (4.5 kg) until your health care provider approves. °· For at least 2 weeks, or as long as told by your health care provider, do not: °? Douche. °? Use tampons. °? Have sexual intercourse. °General instructions ° °· Take over-the-counter and prescription medicines only as told by your health care provider. This is especially important if you take blood thinning medicine. °· Do not take baths, swim, or use a hot tub until your health care provider approves. Take showers instead of baths. °· Wear compression stockings as told by your health care provider. These stockings help to prevent blood clots and reduce swelling in your legs. °· It is your responsibility to get the results of your procedure. Ask your health care provider, or the department performing the procedure, when your results will be ready. °· Keep all follow-up visits as told by your health care provider. This is important. °Contact a health care provider if: °· You have severe cramps that get worse or that do not get better with  medicine. °· You have severe abdominal pain. °· You cannot drink fluids without vomiting. °· You develop pain in a different area of your pelvis. °· You have bad-smelling vaginal discharge. °· You have a rash. °Get help right away if: °· You have vaginal bleeding that soaks more than one sanitary pad in 1 hour, for 2 hours in a row. °· You pass large blood clots from your vagina. °· You have a fever that is above 100.4°F (38.0°C). °· Your abdomen feels very tender or hard. °· You have chest pain. °· You have shortness of breath. °· You cough up blood. °· You feel dizzy or light-headed. °· You faint. °· You have pain in your neck or shoulder area. °This information is not intended to replace advice given to you by your health care provider. Make sure you discuss any questions you have with your health care provider. °Document Released: 03/11/2000 Document Revised: 11/11/2015 Document Reviewed: 10/15/2015 °Elsevier Interactive Patient Education © 2018 [employer] ° °

## 2017-04-21 NOTE — Progress Notes (Signed)
Pt seen for follow up from US due to prior evidence for missed abortion with 8 week size fetal pole and no FHT's.  Todays US confirms same findings with no growth.  Pt denies pain or bleeding.     HPI:  Angela Weaver is a 32 y.o. [phone] Patient's last menstrual period was 02/08/2017 (exact date).; she is being admitted for surgery related to miscarriage.  PMHx: Past Medical History:  Diagnosis Date  . Anemia   . Hypertension   . Migraine   . Vaginal Pap smear, abnormal    LGSIL   Past Surgical History:  Procedure Laterality Date  . KNEE SURGERY    . TONSILLECTOMY    . WISDOM TOOTH EXTRACTION     Family History  Problem Relation Age of Onset  . Melanoma Maternal Aunt 50  . Melanoma Maternal Grandmother 72  . Diabetes Maternal Grandfather    Social History   Tobacco Use  . Smoking status: Former Games developermoker  . Smokeless tobacco: Never Used  Substance Use Topics  . Alcohol use: No  . Drug use: No    Current Outpatient Medications:  .  Doxylamine-Pyridoxine (DICLEGIS) 10-10 MG TBEC, Take 2 tablets by mouth at bedtime. If symptoms persist, add one tablet in the morning and one in the afternoon, Disp: 100 tablet, Rfl: 5 .  labetalol (NORMODYNE) 200 MG tablet, Take 1 tablet (200 mg total) by mouth 2 (two) times daily., Disp: 60 tablet, Rfl: 3 .  metoprolol succinate (TOPROL-XL) 25 MG 24 hr tablet, Take 25 mg by mouth daily., Disp: , Rfl:  .  prenatal vitamin w/FE, FA (PRENATAL 1 + 1) 27-1 MG TABS tablet, Take 1 tablet by mouth daily at 12 noon., Disp: , Rfl:  Allergies: Coconut flavor; Sulfa antibiotics; Coconut oil; Codeine; and Codone [hydrocodone]  Review of Systems  Constitutional: Negative for chills, fever and malaise/fatigue.  HENT: Negative for congestion, sinus pain and sore throat.   Eyes: Negative for blurred vision and pain.  Respiratory: Negative for cough and wheezing.   Cardiovascular: Negative for chest pain and leg swelling.  Gastrointestinal: Negative  for abdominal pain, constipation, diarrhea, heartburn, nausea and vomiting.  Genitourinary: Negative for dysuria, frequency, hematuria and urgency.  Musculoskeletal: Negative for back pain, joint pain, myalgias and neck pain.  Skin: Negative for itching and rash.  Neurological: Negative for dizziness, tremors and weakness.  Endo/Heme/Allergies: Does not bruise/bleed easily.  Psychiatric/Behavioral: Negative for depression. The patient is not nervous/anxious and does not have insomnia.     Objective: BP 140/90   Wt 158 lb (71.7 kg)   LMP 02/08/2017 (Exact Date)   BMI 25.50 kg/m   Filed Weights   04/21/17 1557  Weight: 158 lb (71.7 kg)   Physical Exam  Constitutional: She is oriented to person, place, and time. She appears well-developed and well-nourished. No distress.  Genitourinary: Rectum normal, vagina normal and uterus normal. Pelvic exam was performed with patient supine. There is no rash or lesion on the right labia. There is no rash or lesion on the left labia. Vagina exhibits no lesion. No bleeding in the vagina. Right adnexum does not display mass and does not display tenderness. Left adnexum does not display mass and does not display tenderness. Cervix does not exhibit motion tenderness, lesion, friability or polyp.   Uterus is mobile and midaxial. Uterus is not enlarged or exhibiting a mass.  HENT:  Head: Normocephalic and atraumatic. Head is without laceration.  Right Ear: Hearing normal.  Left Ear: Hearing  normal.  Nose: No epistaxis.  No foreign bodies.  Mouth/Throat: Uvula is midline, oropharynx is clear and moist and mucous membranes are normal.  Eyes: Pupils are equal, round, and reactive to light.  Neck: Normal range of motion. Neck supple. No thyromegaly present.  Cardiovascular: Normal rate and regular rhythm. Exam reveals no gallop and no friction rub.  No murmur heard. Pulmonary/Chest: Effort normal and breath sounds normal. No respiratory distress. She has no  wheezes. Right breast exhibits no mass, no skin change and no tenderness. Left breast exhibits no mass, no skin change and no tenderness.  Abdominal: Soft. Bowel sounds are normal. She exhibits no distension. There is no tenderness. There is no rebound.  Musculoskeletal: Normal range of motion.  Neurological: She is alert and oriented to person, place, and time. No cranial nerve deficit.  Skin: Skin is warm and dry.  Psychiatric: She has a normal mood and affect. Judgment normal.  Vitals reviewed.   Assessment: 1. Missed ab   Options discussed, prefers D&C.          Condolences were offered to the patient and her family.  I stressed that while emotionally difficult, that this did not occur because of an actions or inactions by the patient.  Somewhere between 10-20% of identified first trimester pregnancies will unfortunately end in miscarriage.  Given this relatively high incidence rate, further diagnostic testing such as chromosome analysis is generally not clinically relevant nor recommended.  Although the chromosomal abnormalities have been implicated at rates as high as 70% in some studies, these are generally random and do not infer and increased risk of recurrence with subsequent pregnancies.  However, 3 or more consecutive first trimester losses are relatively uncommon, and these patient generally do benefit from additional work up to determine a potential modifiable etiology.              We briefly discussed management options including expectant management, medical management, and surgical management as well as their relative success rates and complications. Approximately 80% of first trimester miscarriages will pass successfully but may require a time frame of up to 8 weeks (ACOG Practice Bulletin 150 May 2015 "Early Pregnancy Loss").  Medical management has literature supporting its use up to 63 days or [redacted]w[redacted]d gestation and results in a passage rate of 84-85% Environmental education officer Bulletin 143 March  2014 "Medical Management of First-Trimester Abortion").  Dilation and curettage has the highest rate of uterine evacuation, but carries with is operative cost, surgical and anesthetic risk.  While these risk are relatively small they nevertheless include infection, bleeding, uterine perforation, formation of uterine synechia, and in rare cases death.              We discussed repeat ultrasound and or trending HCG levels if the patient wishes to pursue these prior to making her decision.  Clinically I am confident of the diagnosis, but I do not want any doubts in the patient's mind regarding the plan of management she chooses to adopt. The patient elects to proceed with surgical management for her missed abortion. I have discussed with the patient the indications for the procedure. Included in the discussion were the options of therapy, as wall as their individual risks, benefits, and complications. Ample time was given to answer all questions.   While the incidence is low, the risks from this surgery include, but are not limited to, the risks of anesthesia, hemorrhage, infection, perforation, and injury to adjacent structures including bowel, bladder and blood vessels.  Annamarie Major, MD, [facility], [facility] 04/21/2017  4:14 PM

## 2017-04-25 ENCOUNTER — Encounter
Admission: RE | Admit: 2017-04-25 | Discharge: 2017-04-25 | Disposition: A | Discharge status: Home / Self Care | Payer: BLUE CROSS/BLUE SHIELD | Source: Ambulatory Visit | Attending: Obstetrics & Gynecology | Admitting: Obstetrics & Gynecology

## 2017-04-25 ENCOUNTER — Other Ambulatory Visit: Payer: Self-pay

## 2017-04-25 HISTORY — DX: Cardiac murmur, unspecified: R01.1

## 2017-04-25 NOTE — Patient Instructions (Signed)
  Your procedure is scheduled on: 04-27-17 THURSDAY Report to Same Day Surgery 2nd floor medical mall [facility](Medical Mall Entrance-take elevator on left to 2nd floor.  Check in with surgery information desk.) To find out your arrival time please call [phone] between 1PM - 3PM [facility]  Remember: Instructions that are not followed completely may result in serious medical risk, up to and including death, or upon the discretion of your surgeon and anesthesiologist your surgery may need to be rescheduled.    _x___ 1. Do not eat food after midnight the night before your procedure. NO GUM OR CANDY AFTER MIDNIGHT.  You may drink clear liquids up to 2 hours before you are scheduled to arrive at the hospital for your procedure.  Do not drink clear liquids within 2 hours of your scheduled arrival to the hospital.  Clear liquids include  --Water or Apple juice without pulp  --Clear carbohydrate beverage such as ClearFast or Gatorade  --Black Coffee or Clear Tea (No milk, no creamers, do not add anything to  the coffee or Tea    __x__ 2. No Alcohol for 24 hours before or after surgery.   __x__3. No Smoking for 24 prior to surgery.   ____  4. Bring all medications with you on the day of surgery if instructed.    __x__ 5. Notify your doctor if there is any change in your medical condition     (cold, fever, infections).     Do not wear jewelry, make-up, hairpins, clips or nail polish.  Do not wear lotions, powders, or perfumes. You may wear deodorant.  Do not shave 48 hours prior to surgery. Men may shave face and neck.  Do not bring valuables to the hospital.    [facility] is not responsible for any belongings or valuables.               Contacts, dentures or bridgework may not be worn into surgery.  Leave your suitcase in the car. After surgery it may be brought to your room.  For patients admitted to the hospital, discharge time is determined by your treatment team.   Patients  discharged the day of surgery will not be allowed to drive home.  You will need someone to drive you home and stay with you the night of your procedure.    Please read over the following fact sheets that you were given:   [facility] Preparing for Surgery and or MRSA Information   _x___ TAKE THE FOLLOWING MEDICATION THE MORNING OF SURGERY WITH A SMALL SIP OF WATER. These include:  1. LABATELOL  2.  3.  4.  5.  6.  ____Fleets enema or Magnesium Citrate as directed.   _x___ Use CHG Soap or sage wipes as directed on instruction sheet   ____ Use inhalers on the day of surgery and bring to hospital day of surgery  ____ Stop Metformin and Janumet 2 days prior to surgery.    ____ Take 1/2 of usual insulin dose the night before surgery and none on the morning surgery.   ____ Follow recommendations from Cardiologist, Pulmonologist or PCP regarding stopping Aspirin, Coumadin, Plavix ,Eliquis, Effient, or Pradaxa, and Pletal.  X____Stop Anti-inflammatories such as Advil, Aleve, Ibuprofen, Motrin, Naproxen, Naprosyn, Goodies powders or aspirin products NOW-OK to take Tylenol   ____ Stop supplements until after surgery.     ____ Bring C-Pap to the hospital.

## 2017-04-26 ENCOUNTER — Encounter
Admission: RE | Admit: 2017-04-26 | Discharge: 2017-04-26 | Disposition: A | Discharge status: Home / Self Care | Payer: BLUE CROSS/BLUE SHIELD | Source: Ambulatory Visit | Attending: Obstetrics & Gynecology | Admitting: Obstetrics & Gynecology

## 2017-04-26 DIAGNOSIS — O021 Missed abortion: Secondary | ICD-10-CM | POA: Diagnosis present

## 2017-04-26 LAB — CBC
HEMATOCRIT: 36.7 % (ref 35.0–47.0)
Hemoglobin: 12.8 g/dL (ref 12.0–16.0)
MCH: 28.6 pg (ref 26.0–34.0)
MCHC: 35 g/dL (ref 32.0–36.0)
MCV: 81.6 fL (ref 80.0–100.0)
PLATELETS: 250 10*3/uL (ref 150–440)
RBC: 4.49 MIL/uL (ref 3.80–5.20)
RDW: 12.6 % (ref 11.5–14.5)
WBC: 6.6 10*3/uL (ref 3.6–11.0)

## 2017-04-26 LAB — TYPE AND SCREEN
ABO/RH(D): A POS
ANTIBODY SCREEN: NEGATIVE

## 2017-04-27 ENCOUNTER — Encounter
Admission: RE | Disposition: A | Discharge status: Home / Self Care | Payer: Self-pay | Source: Ambulatory Visit | Attending: Obstetrics & Gynecology

## 2017-04-27 ENCOUNTER — Ambulatory Visit
Admission: RE | Admit: 2017-04-27 | Discharge: 2017-04-27 | Disposition: A | Discharge status: Home / Self Care | Payer: BLUE CROSS/BLUE SHIELD | Source: Ambulatory Visit | Attending: Obstetrics & Gynecology | Admitting: Obstetrics & Gynecology

## 2017-04-27 ENCOUNTER — Ambulatory Visit: Payer: BLUE CROSS/BLUE SHIELD | Admitting: Anesthesiology

## 2017-04-27 ENCOUNTER — Encounter: Payer: Self-pay | Admitting: *Deleted

## 2017-04-27 ENCOUNTER — Other Ambulatory Visit: Payer: Self-pay

## 2017-04-27 DIAGNOSIS — O021 Missed abortion: Secondary | ICD-10-CM

## 2017-04-27 HISTORY — PX: DILATION AND EVACUATION: SHX1459

## 2017-04-27 SURGERY — DILATION AND EVACUATION, UTERUS
Anesthesia: General | Site: Vagina | Wound class: Clean Contaminated

## 2017-04-27 MED ORDER — ONDANSETRON HCL 4 MG/2ML IJ SOLN
INTRAMUSCULAR | Status: DC | PRN
  Administered 2017-04-27: 4 mg via INTRAVENOUS

## 2017-04-27 MED ORDER — MIDAZOLAM HCL 2 MG/2ML IJ SOLN
INTRAMUSCULAR | Status: AC
  Filled 2017-04-27: qty 2

## 2017-04-27 MED ORDER — ONDANSETRON HCL 4 MG/2ML IJ SOLN
4.0000 mg | Freq: Once | INTRAMUSCULAR | Status: DC | PRN
Start: 2017-04-27 — End: 2017-04-27

## 2017-04-27 MED ORDER — FAMOTIDINE 20 MG PO TABS
20.0000 mg | ORAL_TABLET | Freq: Once | ORAL | Status: AC
  Administered 2017-04-27: 20 mg via ORAL

## 2017-04-27 MED ORDER — ACETAMINOPHEN 325 MG PO TABS: 650.0000 mg | ORAL_TABLET | ORAL | Status: DC | PRN

## 2017-04-27 MED ORDER — ACETAMINOPHEN 650 MG RE SUPP
650.0000 mg | RECTAL | Status: DC | PRN
  Filled 2017-04-27: qty 1

## 2017-04-27 MED ORDER — FENTANYL CITRATE (PF) 100 MCG/2ML IJ SOLN
INTRAMUSCULAR | Status: DC | PRN
  Administered 2017-04-27 (×2): 50 ug via INTRAVENOUS

## 2017-04-27 MED ORDER — LIDOCAINE HCL (PF) 2 % IJ SOLN
INTRAMUSCULAR | Status: AC
  Filled 2017-04-27: qty 10

## 2017-04-27 MED ORDER — LACTATED RINGERS IV SOLN
INTRAVENOUS | Status: DC
  Administered 2017-04-27: 14:00:00 via INTRAVENOUS

## 2017-04-27 MED ORDER — MORPHINE SULFATE (PF) 4 MG/ML IV SOLN: 1.0000 mg | INTRAVENOUS | Status: DC | PRN

## 2017-04-27 MED ORDER — PROPOFOL 10 MG/ML IV BOLUS
INTRAVENOUS | Status: AC
  Filled 2017-04-27: qty 20

## 2017-04-27 MED ORDER — DEXAMETHASONE SODIUM PHOSPHATE 10 MG/ML IJ SOLN
INTRAMUSCULAR | Status: DC | PRN
  Administered 2017-04-27: 10 mg via INTRAVENOUS

## 2017-04-27 MED ORDER — LACTATED RINGERS IV SOLN: INTRAVENOUS | Status: DC

## 2017-04-27 MED ORDER — DOXYCYCLINE HYCLATE 100 MG PO CAPS: 100.0000 mg | ORAL_CAPSULE | Freq: Two times a day (BID) | ORAL | 0 refills | Status: DC

## 2017-04-27 MED ORDER — LIDOCAINE HCL (CARDIAC) 20 MG/ML IV SOLN
INTRAVENOUS | Status: DC | PRN
  Administered 2017-04-27: 40 mg via INTRAVENOUS

## 2017-04-27 MED ORDER — PROPOFOL 10 MG/ML IV BOLUS
INTRAVENOUS | Status: DC | PRN
  Administered 2017-04-27: 150 mg via INTRAVENOUS

## 2017-04-27 MED ORDER — ONDANSETRON HCL 4 MG/2ML IJ SOLN
INTRAMUSCULAR | Status: AC
  Filled 2017-04-27: qty 2

## 2017-04-27 MED ORDER — FAMOTIDINE 20 MG PO TABS
ORAL_TABLET | ORAL | Status: AC
  Administered 2017-04-27: 20 mg via ORAL
  Filled 2017-04-27: qty 1

## 2017-04-27 MED ORDER — IBUPROFEN 600 MG PO TABS: 600.0000 mg | ORAL_TABLET | Freq: Four times a day (QID) | ORAL | 3 refills | Status: DC | PRN

## 2017-04-27 MED ORDER — KETOROLAC TROMETHAMINE 30 MG/ML IJ SOLN
30.0000 mg | Freq: Four times a day (QID) | INTRAMUSCULAR | Status: DC
  Filled 2017-04-27: qty 1

## 2017-04-27 MED ORDER — METHYLERGONOVINE MALEATE 0.2 MG PO TABS: 0.2000 mg | ORAL_TABLET | Freq: Four times a day (QID) | ORAL | 0 refills | Status: DC

## 2017-04-27 MED ORDER — MIDAZOLAM HCL 2 MG/2ML IJ SOLN
INTRAMUSCULAR | Status: DC | PRN
  Administered 2017-04-27 (×2): 1 mg via INTRAVENOUS

## 2017-04-27 MED ORDER — LACTATED RINGERS IV SOLN
INTRAVENOUS | Status: DC | PRN
  Administered 2017-04-27: 15:00:00 via INTRAVENOUS

## 2017-04-27 MED ORDER — SUGAMMADEX SODIUM 200 MG/2ML IV SOLN
INTRAVENOUS | Status: AC
  Filled 2017-04-27: qty 2

## 2017-04-27 MED ORDER — KETOROLAC TROMETHAMINE 30 MG/ML IJ SOLN
INTRAMUSCULAR | Status: AC
  Administered 2017-04-27: 30 mg
  Filled 2017-04-27: qty 1

## 2017-04-27 MED ORDER — FENTANYL CITRATE (PF) 100 MCG/2ML IJ SOLN
INTRAMUSCULAR | Status: AC
  Filled 2017-04-27: qty 2

## 2017-04-27 MED ORDER — FENTANYL CITRATE (PF) 100 MCG/2ML IJ SOLN: 25.0000 ug | INTRAMUSCULAR | Status: DC | PRN

## 2017-04-27 MED ORDER — DEXAMETHASONE SODIUM PHOSPHATE 10 MG/ML IJ SOLN
INTRAMUSCULAR | Status: AC
  Filled 2017-04-27: qty 1

## 2017-04-27 SURGICAL SUPPLY — 21 items
BAG COUNTER SPONGE EZ (MISCELLANEOUS) IMPLANT
CANISTER SUC SOCK COL 7IN (MISCELLANEOUS) ×2 IMPLANT
CATH ROBINSON RED A/P 16FR (CATHETERS) ×2 IMPLANT
FILTER UTR ASPR SPEC (MISCELLANEOUS) ×1 IMPLANT
FLTR UTR ASPR SPEC (MISCELLANEOUS) ×2
GLOVE BIO SURGEON STRL SZ8 (GLOVE) ×2 IMPLANT
GOWN STRL REUS W/ TWL LRG LVL3 (GOWN DISPOSABLE) ×1 IMPLANT
GOWN STRL REUS W/ TWL XL LVL3 (GOWN DISPOSABLE) ×1 IMPLANT
GOWN STRL REUS W/TWL LRG LVL3 (GOWN DISPOSABLE) ×1
GOWN STRL REUS W/TWL XL LVL3 (GOWN DISPOSABLE) ×1
KIT BERKELEY 1ST TRIMESTER 3/8 (MISCELLANEOUS) ×2 IMPLANT
KIT TURNOVER CYSTO (KITS) ×2 IMPLANT
NS IRRIG 500ML POUR BTL (IV SOLUTION) ×2 IMPLANT
PACK DNC HYST (MISCELLANEOUS) ×2 IMPLANT
PAD OB MATERNITY 4.3X12.25 (PERSONAL CARE ITEMS) ×2 IMPLANT
PAD PREP 24X41 OB/GYN DISP (PERSONAL CARE ITEMS) ×2 IMPLANT
SET BERKELEY SUCTION TUBING (SUCTIONS) IMPLANT
TOWEL OR 17X26 4PK STRL BLUE (TOWEL DISPOSABLE) ×2 IMPLANT
VACURETTE 10 RIGID CVD (CANNULA) IMPLANT
VACURETTE 12 RIGID CVD (CANNULA) IMPLANT
VACURETTE 8 RIGID CVD (CANNULA) ×2 IMPLANT

## 2017-04-27 NOTE — Anesthesia Preprocedure Evaluation (Signed)
Anesthesia Evaluation  Patient identified by MRN, date of birth, ID band Patient awake    Reviewed: Allergy & Precautions, NPO status , Patient's Chart, lab work & pertinent test results, reviewed documented beta blocker date and time   Airway Mallampati: II  TM Distance: >3 FB     Dental  (+) Chipped   Pulmonary former smoker,           Cardiovascular hypertension, Pt. on medications and Pt. on home beta blockers + Valvular Problems/Murmurs      Neuro/Psych  Headaches,    GI/Hepatic   Endo/Other    Renal/GU      Musculoskeletal   Abdominal   Peds  Hematology  (+) anemia ,   Anesthesia Other Findings   Reproductive/Obstetrics                             Anesthesia Physical Anesthesia Plan  ASA: III  Anesthesia Plan: General   Post-op Pain Management:    Induction: Intravenous  PONV Risk Score and Plan:   Airway Management Planned: LMA  Additional Equipment:   Intra-op Plan:   Post-operative Plan:   Informed Consent: I have reviewed the patients History and Physical, chart, labs and discussed the procedure including the risks, benefits and alternatives for the proposed anesthesia with the patient or authorized representative who has indicated his/her understanding and acceptance.     Plan Discussed with: CRNA  Anesthesia Plan Comments:         Anesthesia Quick Evaluation

## 2017-04-27 NOTE — Transfer of Care (Signed)
Immediate Anesthesia Transfer of Care Note  Patient: Angela BearEsther A Flahive  Procedure(s) Performed: DILATATION AND EVACUATION (Vagina )  Patient Location: PACU and Endoscopy Unit  Anesthesia Type:General  Level of Consciousness: awake, oriented, drowsy and patient cooperative  Airway & Oxygen Therapy: Patient Spontanous Breathing and Patient connected to face mask oxygen  Post-op Assessment: Report given to RN and Post -op Vital signs reviewed and stable  Post vital signs: Reviewed and stable  Last Vitals:  Vitals:   04/27/17 1306  BP: (!) 139/91  Pulse: 74  Resp: 18  Temp: 36.7 C  SpO2: 100%    Last Pain:  Vitals:   04/27/17 1306  TempSrc: Oral         Complications: No apparent anesthesia complications

## 2017-04-27 NOTE — OR Nursing (Signed)
Discussed discharge instructions with pt and husband. Both voice understanding. 

## 2017-04-27 NOTE — H&P (Signed)
History and Physical Interval Note:  04/27/2017 2:49 PM  Angela Weaver  has presented today for surgery, with the diagnosis of MISSED SBORTION  The various methods of treatment have been discussed with the patient and family. After consideration of risks, benefits and other options for treatment, the patient has consented to  Procedure(s): DILATATION AND EVACUATION (N/A) as a surgical intervention .  The patient's history has been reviewed, patient examined, no change in status, stable for surgery.  Pt has the following beta blocker history-  Not taking Beta Blocker.  I have reviewed the patient's chart and labs.  Questions were answered to the patient's satisfaction.    Annamarie MajorPaul Dimitrios Balestrieri, MD, Merlinda FrederickFACOG [facility], [facility] 04/27/2017  2:49 PM

## 2017-04-27 NOTE — Anesthesia Post-op Follow-up Note (Signed)
Anesthesia QCDR form completed.        

## 2017-04-27 NOTE — Anesthesia Procedure Notes (Signed)
Procedure Name: LMA Insertion Date/Time: 04/27/2017 3:25 PM Performed by: Charna Busmaniamond, Tristine Langi, CRNA Pre-anesthesia Checklist: Patient identified, Suction available, Emergency Drugs available, Patient being monitored and Timeout performed Patient Re-evaluated:Patient Re-evaluated prior to induction Oxygen Delivery Method: Circle system utilized Preoxygenation: Pre-oxygenation with 100% oxygen Induction Type: Combination inhalational/ intravenous induction Ventilation: Mask ventilation without difficulty LMA: LMA inserted LMA Size: 3.5 Grade View: Grade II Number of attempts: 1 Placement Confirmation: positive ETCO2,  CO2 detector and breath sounds checked- equal and bilateral Tube secured with: Tape

## 2017-04-27 NOTE — Discharge Instructions (Signed)
Dilation and Curettage or Vacuum Curettage, Care After °This sheet gives you information about how to care for yourself after your procedure. Your health care provider may also give you more specific instructions. If you have problems or questions, contact your health care provider. °What can I expect after the procedure? °After your procedure, it is common to have: °· Mild pain or cramping. °· Some vaginal bleeding or spotting. ° °These may last for up to 2 weeks after your procedure. °Follow these instructions at home: °Activity ° °· Do not drive or use heavy machinery while taking prescription pain medicine. °· Avoid driving for the first 24 hours after your procedure. °· Take frequent, short walks, followed by rest periods, throughout the day. Ask your health care provider what activities are safe for you. After 1-2 days, you may be able to return to your normal activities. °· Do not lift anything heavier than 10 lb (4.5 kg) until your health care provider approves. °· For at least 2 weeks, or as long as told by your health care provider, do not: °? Douche. °? Use tampons. °? Have sexual intercourse. °General instructions ° °· Take over-the-counter and prescription medicines only as told by your health care provider. This is especially important if you take blood thinning medicine. °· Do not take baths, swim, or use a hot tub until your health care provider approves. Take showers instead of baths. °· Wear compression stockings as told by your health care provider. These stockings help to prevent blood clots and reduce swelling in your legs. °· It is your responsibility to get the results of your procedure. Ask your health care provider, or the department performing the procedure, when your results will be ready. °· Keep all follow-up visits as told by your health care provider. This is important. °Contact a health care provider if: °· You have severe cramps that get worse or that do not get better with  medicine. °· You have severe abdominal pain. °· You cannot drink fluids without vomiting. °· You develop pain in a different area of your pelvis. °· You have bad-smelling vaginal discharge. °· You have a rash. °Get help right away if: °· You have vaginal bleeding that soaks more than one sanitary pad in 1 hour, for 2 hours in a row. °· You pass large blood clots from your vagina. °· You have a fever that is above 100.4°F (38.0°C). °· Your abdomen feels very tender or hard. °· You have chest pain. °· You have shortness of breath. °· You cough up blood. °· You feel dizzy or light-headed. °· You faint. °· You have pain in your neck or shoulder area. °This information is not intended to replace advice given to you by your health care provider. Make sure you discuss any questions you have with your health care provider. °Document Released: 03/11/2000 Document Revised: 11/11/2015 Document Reviewed: 10/15/2015 °Elsevier Interactive Patient Education © 2018 Elsevier Inc[employer] ° ° °AMBULATORY SURGERY  °DISCHARGE INSTRUCTIONS ° ° °1) The drugs that you were given will stay in your system until tomorrow so for the next 24 hours you should not: ° °A) Drive an automobile °B) Make any legal decisions °C) Drink any alcoholic beverage ° ° °2) You may resume regular meals tomorrow.  Today it is better to start with liquids and gradually work up to solid foods. ° °You may eat anything you prefer, but it is better to start with liquids, then soup and crackers, and gradually work up to solid foods. ° ° °  3) Please notify your doctor immediately if you have any unusual bleeding, trouble breathing, redness and pain at the surgery site, drainage, fever, or pain not relieved by medication. ° ° ° °4) Additional Instructions: ° ° °Please contact your physician with any problems or Same Day Surgery at [phone], Monday through Friday 6 am to 4 pm, or [place] at  Main number at [phone]. ° ° ° ° ° ° ° °

## 2017-04-27 NOTE — Op Note (Signed)
  Operative Note  04/27/2017 3:53 PM  PRE-OP DIAGNOSIS: MISSED ABORTION   POST-OP DIAGNOSIS: same  SURGEON: Annamarie MajorPaul Harris, MD, FACOG  ANESTHESIA: Choice   PROCEDURE: Procedure(s): DILATATION AND EVACUATION   ESTIMATED BLOOD LOSS: 200 mL   SPECIMENS: POC   COMPLICATIONS: none  DISPOSITION: PACU - hemodynamically stable.  CONDITION: stable  FINDINGS: Exam under anesthesia revealed a 10 wk size uterus without palpable adnexal masses.   INDICATION FOR PROCEDURE: Missed abortion based on ultrasounds.  PROCEDURE IN DETAIL: After informed consent was obtained, the patient was taken to the operating room where anesthesia was obtained without difficulty. The patient was positioned in the dorsal lithotomy position with [employer] Stirrups. Time out was performed and an exam under anesthesia was performed. The vagina, perineum, and lower abdomen were prepped and draped in a normal sterile fashion. The bladder was emptied with an I&O catheter. A speculum was placed into the vagina and the cervix was grasped with a single toothed tenaculum. The uterus was sounded to 11cm.  The cervix was gently dilated to 20 [place] with  [employer] Corporation[employer]att dilators. The suction was then tested and found to be adequate, and a 8mm rigid suction cannula was advanced into the uterine cavity. The suction was activated and the contents of the uterus were aspirated until no further tissue was obtained. The uterus was then curetted to gritty texture throughout.  At the end of the procedure bleeding was noted to be Minimal.  All instruments were then removed from the vagina.The patient tolerated the procedure well. All sponge, instrument, and needle counts were correct. The patient was taken to the recovery room in good condition.   Annamarie MajorPaul Harris, MD, Merlinda FrederickFACOG [facility], [facility] 04/27/2017  3:53 PM

## 2017-04-27 NOTE — Anesthesia Postprocedure Evaluation (Signed)
Anesthesia Post Note  Patient: Angela BearEsther A Gang  Procedure(s) Performed: DILATATION AND EVACUATION (Vagina )  Anesthesia Type: General     Last Vitals:  Vitals:   04/27/17 1306 04/27/17 1606  BP: (!) 139/91 (!) 127/108  Pulse: 74 (!) 112  Resp: 18 16  Temp: 36.7 C 36.9 C  SpO2: 100% 100%    Last Pain:  Vitals:   04/27/17 1306  TempSrc: Oral                 Beola Cordiamond,  Josiane Labine E

## 2017-04-28 ENCOUNTER — Telehealth: Payer: Self-pay

## 2017-04-28 NOTE — Telephone Encounter (Signed)
Pt states she had a D&C yesterday. She has a question about one of the rx's as far as breastfeeding while taking it. ZO#[phone]#[phone]

## 2017-04-28 NOTE — Telephone Encounter (Signed)
Spoke w/pt. Methergine is the rx & instructions say to consult MD prior to taking if you are breastfeeding. Pt states she isn't bleeding that much right now & is inquiring if she even needs to take this medication.

## 2017-04-28 NOTE — Telephone Encounter (Signed)
Per SDJ, If pt is only spotting ok not to take meds. Pt aware.

## 2017-05-01 LAB — SURGICAL PATHOLOGY

## 2017-05-12 ENCOUNTER — Ambulatory Visit (INDEPENDENT_AMBULATORY_CARE_PROVIDER_SITE_OTHER): Payer: BLUE CROSS/BLUE SHIELD | Admitting: Obstetrics & Gynecology

## 2017-05-12 ENCOUNTER — Encounter: Payer: Self-pay | Admitting: Obstetrics & Gynecology

## 2017-05-12 VITALS — BP 120/80 | HR 76 | Ht 66.0 in | Wt 157.0 lb

## 2017-05-12 DIAGNOSIS — O021 Missed abortion: Secondary | ICD-10-CM

## 2017-05-12 NOTE — Progress Notes (Signed)
  [facility]up [facility].  Pathology: DIAGNOSIS:  A, B, AND C. PRODUCTS OF CONCEPTION; DILATATION AND EVACUATION:  - CHORIONIC VILLI AND DECIDUA CONSISTENT WITH PRODUCTS OF CONCEPTION.  - BLOOD CLOT.   Subjective: Patient reports marked improvement in her preop symptoms. Eating a regular diet without difficulty. The patient is not having any pain.  Activity: normal activities of daily living. Patient reports vaginal sx's of None  Objective: BP 120/80   Pulse 76   Ht 5\' 6"  (1.676 m)   Wt 157 lb (71.2 kg)   LMP 02/08/2017 (Exact Date)   BMI 25.34 kg/m  Physical Exam  Constitutional: She is oriented to person, place, and time. She appears well-developed and well-nourished. No distress.  Cardiovascular: Normal rate.  Pulmonary/Chest: Effort normal.  Abdominal: Soft. She exhibits no distension. There is no tenderness.  Musculoskeletal: Normal range of motion.  Neurological: She is alert and oriented to person, place, and time. No cranial nerve deficit.  Skin: Skin is warm and dry.  Psychiatric: She has a normal mood and affect.    Assessment: s/p Missed abortion, D&C s/p :  D&C stable  Plan: Patient has done well after surgery with no apparent complications.  I have discussed the post-operative course to date, and the expected progress moving forward.  The patient understands what complications to be concerned about.  I will see the patient in routine follow up, or sooner if needed.    Activity plan: No restriction.  Plans attempt for pregnancy again soon. No BC desired.  Letitia Libraobert Paul Aniesha Haughn 05/12/2017, 9:27 AM

## 2017-06-06 ENCOUNTER — Other Ambulatory Visit: Payer: Self-pay

## 2017-06-06 ENCOUNTER — Encounter: Payer: Self-pay | Admitting: Emergency Medicine

## 2017-06-06 ENCOUNTER — Ambulatory Visit
Admission: EM | Admit: 2017-06-06 | Discharge: 2017-06-06 | Disposition: A | Discharge status: Home / Self Care | Payer: BLUE CROSS/BLUE SHIELD | Attending: Family Medicine | Admitting: Family Medicine

## 2017-06-06 DIAGNOSIS — J0101 Acute recurrent maxillary sinusitis: Secondary | ICD-10-CM

## 2017-06-06 MED ORDER — AZITHROMYCIN 250 MG PO TABS: 250.0000 mg | ORAL_TABLET | Freq: Every day | ORAL | 0 refills | Status: DC

## 2017-06-06 MED ORDER — CEFDINIR 300 MG PO CAPS: 300.0000 mg | ORAL_CAPSULE | Freq: Two times a day (BID) | ORAL | 0 refills | Status: DC

## 2017-06-06 NOTE — ED Provider Notes (Signed)
[facility]    CSN: [id number] Arrival date & time: 06/06/17  1605     History   Chief Complaint Chief Complaint  Patient presents with  . Sinus Problem    HPI Angela Weaver is a 32 y.o. female.   HPI  32 year old female presents with sinus congestion pressure and teeth pain started today.  Had no fevers or chills.  She relates that she was recently treated for sinusitis finished approximately 2 weeks ago.  She is breast-feeding and her son is allergic to penicillin.  States that she had received a prescription for azithromycin 500 mg 3 times the symptoms return and appears to be somewhat worse.  Been using a  Nettie pot, not using Flonase.       Past Medical History:  Diagnosis Date  . Anemia   . Heart murmur    SAW CARDIOLOGIST IN 2011 AND EVERYTHING WAS FINE PER PT-  ASYPMTOMATIC  . Hypertension   . Migraine    migraines  . Vaginal Pap smear, abnormal    LGSIL    Patient Active Problem List   Diagnosis Date Noted  . Missed abortion 04/27/2017    Past Surgical History:  Procedure Laterality Date  . DILATION AND EVACUATION  04/27/2017   Procedure: DILATATION AND EVACUATION;  Surgeon: Nadara Mustard, MD;  Location: [facility] ORS;  Service: Gynecology;;  . KNEE SURGERY    . TONSILLECTOMY    . WISDOM TOOTH EXTRACTION      OB History    Gravida Para Term Preterm AB Living   5 3 3   1 3    SAB TAB Ectopic Multiple Live Births   1     0 3       Home Medications    Prior to Admission medications   Medication Sig Start Date End Date Taking? Authorizing Provider  labetalol (NORMODYNE) 200 MG tablet Take 1 tablet (200 mg total) by mouth 2 (two) times daily. 03/30/17  Yes Schuman, Christanna R, MD  prenatal vitamin w/FE, FA (PRENATAL 1 + 1) 27-1 MG TABS tablet Take 1 tablet by mouth every evening.    Yes [provider]  acetaminophen (TYLENOL) 500 MG tablet Take 500 mg by mouth every 6 (six) hours as needed (for pain.).    [provider]  cefdinir (OMNICEF) 300 MG capsule Take 1 capsule (300 mg total) by mouth 2 (two) times daily. 06/06/17   Lutricia Feil, PA-C  ibuprofen (ADVIL,MOTRIN) 600 MG tablet Take 1 tablet (600 mg total) by mouth every 6 (six) hours as needed. 04/27/17   Nadara Mustard, MD  methylergonovine (METHERGINE) 0.2 MG tablet Take 1 tablet (0.2 mg total) by mouth 4 (four) times daily. For Four doses to minimize bleeding 04/27/17   Nadara Mustard, MD    Family History Family History  Problem Relation Age of Onset  . Melanoma Maternal Aunt 50  . Melanoma Maternal Grandmother 72  . Diabetes Maternal Grandfather     Social History Social History   Tobacco Use  . Smoking status: Former Smoker    Packs/day: 0.25    Years: 2.00    Pack years: 0.50    Types: Cigarettes    Last attempt to quit: 04/26/2011    Years since quitting: 6.1  . Smokeless tobacco: Never Used  . Tobacco comment: more socially   Substance Use Topics  . Alcohol use: No  . Drug use: No     Allergies   Coconut  flavor; Sulfa antibiotics; Coconut oil; Codeine; and Codone [hydrocodone]   Review of Systems Review of Systems  Constitutional: Positive for activity change. Negative for chills, fatigue and fever.  HENT: Positive for postnasal drip, sinus pressure and sinus pain.   All other systems reviewed and are negative.    Physical Exam Triage Vital Signs ED Triage Vitals  Enc Vitals Group     BP 06/06/17 1628 (!) 130/92     Pulse Rate 06/06/17 1628 86     Resp 06/06/17 1628 16     Temp 06/06/17 1628 98.4 F (36.9 C)     Temp Source 06/06/17 1628 Oral     SpO2 06/06/17 1628 99 %     Weight 06/06/17 1625 155 lb (70.3 kg)     Height 06/06/17 1625 5\' 6"  (1.676 m)     Head Circumference --      Peak Flow --      Pain Score 06/06/17 1625 4     Pain Loc --      Pain Edu? --      Excl. in GC? --    No data found.  Updated Vital Signs BP (!) 130/92 (BP Location: Left Arm)   Pulse 86   Temp 98.4  F (36.9 C) (Oral)   Resp 16   Ht 5\' 6"  (1.676 m)   Wt 155 lb (70.3 kg)   LMP 02/08/2017 (Exact Date)   SpO2 99%   Breastfeeding? Yes   BMI 25.02 kg/m   Visual Acuity Right Eye Distance:   Left Eye Distance:   Bilateral Distance:    Right Eye Near:   Left Eye Near:    Bilateral Near:     Physical Exam  Constitutional: She is oriented to person, place, and time. She appears well-developed and well-nourished. No distress.  HENT:  Head: Normocephalic.  Right Ear: External ear normal.  Left Ear: External ear normal.  Nose: Nose normal.  Mouth/Throat: Oropharynx is clear and moist. No oropharyngeal exudate.  Eyes: Pupils are equal, round, and reactive to light.  Neck: Normal range of motion.  Pulmonary/Chest: Effort normal and breath sounds normal.  Musculoskeletal: Normal range of motion.  Lymphadenopathy:    She has no cervical adenopathy.  Neurological: She is alert and oriented to person, place, and time.  Skin: Skin is warm and dry. She is not diaphoretic.  Psychiatric: She has a normal mood and affect. Her behavior is normal. Judgment and thought content normal.  Nursing note and vitals reviewed.    UC Treatments / Results  Labs (all labs ordered are listed, but only abnormal results are displayed) Labs Reviewed - No data to display  EKG  EKG Interpretation None       Radiology No results found.  Procedures Procedures (including critical care time)  Medications Ordered in UC Medications - No data to display   Initial Impression / Assessment and Plan / UC Course  I have reviewed the triage vital signs and the nursing notes.  Pertinent labs & imaging results that were available during my care of the patient were reviewed by me and considered in my medical decision making (see chart for details).     Plan: 1. Test/x-ray results and diagnosis reviewed with patient 2. rx as per orders; risks, benefits, potential side effects reviewed with  patient 3. Recommend supportive treatment with continued use of the Nettie pot followed by Flonase.  Change from the azithromycin that she has [facility] and she states that  her son is not allergic to [facility] and has not had it in the past.  To follow-up with her primary care physician if she is not improving. 4. F/u prn if symptoms worsen or don't improve   Final Clinical Impressions(s) / UC Diagnoses   Final diagnoses:  Acute recurrent maxillary sinusitis    ED Discharge Orders        Ordered    azithromycin (ZITHROMAX) 250 MG tablet  Daily,   Status:  Discontinued     06/06/17 1648    cefdinir (OMNICEF) 300 MG capsule  2 times daily     06/06/17 1654       Controlled Substance Prescriptions [place] Controlled Substance Registry consulted? Not Applicable   Lutricia Feil, PA-C 06/06/17 1709

## 2017-06-06 NOTE — ED Triage Notes (Addendum)
Patient c/o sinus congestion and pressure that started today.  Patient also reports teeth pain.  Patient denies fevers.

## 2017-06-11 ENCOUNTER — Encounter: Payer: Self-pay | Admitting: Obstetrics & Gynecology

## 2017-07-04 ENCOUNTER — Encounter: Payer: Self-pay | Admitting: Obstetrics & Gynecology

## 2017-07-04 NOTE — Telephone Encounter (Signed)
Sch appt for preg test and discussion w PH today or tomorrow

## 2017-07-05 ENCOUNTER — Ambulatory Visit (INDEPENDENT_AMBULATORY_CARE_PROVIDER_SITE_OTHER): Payer: BLUE CROSS/BLUE SHIELD | Admitting: Obstetrics & Gynecology

## 2017-07-05 ENCOUNTER — Encounter: Payer: Self-pay | Admitting: Obstetrics & Gynecology

## 2017-07-05 VITALS — BP 140/98 | HR 87 | Ht 66.0 in | Wt 161.0 lb

## 2017-07-05 DIAGNOSIS — N926 Irregular menstruation, unspecified: Secondary | ICD-10-CM | POA: Diagnosis not present

## 2017-07-05 LAB — POCT URINE PREGNANCY: PREG TEST UR: POSITIVE — AB

## 2017-07-05 NOTE — Progress Notes (Signed)
  Obstetric Problem Visit   Chief Complaint:  Chief Complaint  Patient presents with  . Amenorrhea   History of Present Illness: Patient is a 32 y.o. [id number] presenting for first trimester symptoms of a missed period after irregularity based on miscarriage in January and then restarting periods at irreg intervals since; LMP 05/29/17.  No pain. No nausea.  Any recent trauma:  No Recent intercourse:  No History of prior miscarriage:  Yes Prior ultrasound demonstrating IUP:  No Prior ultrasound demonstrating viable IUP:  No Prior Serum HCG:  No Rh status: A+  PMHx: She  has a past medical history of Anemia, Heart murmur, Hypertension, Migraine, and Vaginal Pap smear, abnormal. Also,  has a past surgical history that includes Knee surgery; Tonsillectomy; Wisdom tooth extraction; and Dilation and evacuation (04/27/2017)., family history includes Diabetes in her maternal grandfather; Melanoma (age of onset: 5350) in her maternal aunt; Melanoma (age of onset: 2972) in her maternal grandmother.,  reports that she quit smoking about 6 years ago. Her smoking use included cigarettes. She has a 0.50 pack-year smoking history. She has never used smokeless tobacco. She reports that she does not drink alcohol or use drugs.  She has a current medication list which includes the following prescription(s): labetalol, prenatal vitamin w/fe, fa, and acetaminophen. Also, is allergic to coconut flavor; sulfa antibiotics; coconut oil; codeine; and codone [hydrocodone].  Review of Systems  Constitutional: Negative for chills, fever and malaise/fatigue.  HENT: Negative for congestion, sinus pain and sore throat.   Eyes: Negative for blurred vision and pain.  Respiratory: Negative for cough and wheezing.   Cardiovascular: Negative for chest pain and leg swelling.  Gastrointestinal: Negative for abdominal pain, constipation, diarrhea, heartburn, nausea and vomiting.  Genitourinary: Negative for dysuria, frequency,  hematuria and urgency.  Musculoskeletal: Negative for back pain, joint pain, myalgias and neck pain.  Skin: Negative for itching and rash.  Neurological: Negative for dizziness, tremors and weakness.  Endo/Heme/Allergies: Does not bruise/bleed easily.  Psychiatric/Behavioral: Negative for depression. The patient is not nervous/anxious and does not have insomnia.     Objective: Vitals:   07/05/17 1024  BP: (!) 140/98  Pulse: 87   Physical Exam  Constitutional: She is oriented to person, place, and time. She appears well-developed and well-nourished. No distress.  Musculoskeletal: Normal range of motion.  Neurological: She is alert and oriented to person, place, and time.  Skin: Skin is warm and dry.  Psychiatric: She has a normal mood and affect.  Vitals reviewed.  Assessment: 32 y.o. [id number]  1. Missed period - POCT urine pregnancy - POS - Beta hCG quant (ref lab) - confirmatory and as baseline - US OB Comp Less 14 Wks; in 2 weeks; sooner if sx's of concern develop  2. Hypertension - Cont Labetalol  3. Pregnancy - PNV; dietary and activity restrictions discussed - PAP UTD, needs again in Jan 2020 - Labs and cultures once pregnancy established more so  Annamarie MajorPaul James Lafalce, MD, Merlinda FrederickFACOG [facility], [facility] 07/05/2017  10:43 AM

## 2017-07-05 NOTE — Progress Notes (Signed)
poct

## 2017-07-06 ENCOUNTER — Telehealth: Payer: Self-pay

## 2017-07-06 LAB — BETA HCG QUANT (REF LAB): hCG Quant: 65 m[IU]/mL

## 2017-07-06 NOTE — Progress Notes (Signed)
Let her know Preg test level POS at 65.  Keep appt but call if she has pain or bleeding.

## 2017-07-06 NOTE — Telephone Encounter (Signed)
Pt aware to keep appt and aware of lab results, will call us if any problems

## 2017-07-06 NOTE — Telephone Encounter (Signed)
-----   Message from Nadara Mustardobert P Harris, MD sent at 07/06/2017 10:40 AM EDT ----- Let her know Preg test level POS at 65.  Keep appt but call if she has pain or bleeding.

## 2017-07-06 NOTE — Progress Notes (Signed)
Pt aware.

## 2017-07-19 ENCOUNTER — Encounter: Payer: Self-pay | Admitting: Obstetrics & Gynecology

## 2017-07-21 ENCOUNTER — Telehealth: Payer: Self-pay | Admitting: Obstetrics and Gynecology

## 2017-07-21 NOTE — Telephone Encounter (Signed)
Started bleeding wednesday afternoon. No clots. Not heavy. Went to ER. She was told that her pregnancy hormone level was 1,309. It was recommended to her to follow up with her OB/GYN or that she could come back tomorrow (Saturday, 4/27).  A pelvic ultrasound was done at the ER and was told that everything was inconclusive based on that.  She has had minimal pain that is sporadic and relieved by relaxing.  Nothing has changed her bleeding.  The bleeding today is heavier than the previous two days. However, she is only having to wear a panty liner.  Discussed that she is quite early in her pregnancy and that the information she might get by returning to the ER (based on her current symptoms) is that the pregnancy is either progressing normally or abnormally. If progressing abnormally, then I she would have the option to expectantly manage her miscarriage. If progressing normally, then we would continue to trend the hCG values.  In either case, the main information she would likely get from going to the ER where she is taking her vacation is whether her pregnancy is progressing normally. I recommend her returning to the ER, if she feels like she needs that information and does not want to wait.  Otherwise, she should be ok to wait until she Monday (3 days). She was given precautions for worsening pain (which might indicate an ectopic pregnancy) or worsening bleeding (soaking > 2 pads/hour for > 2 hours), which means her level of bleeding is too high.   All questions answered. If no changes, she will keep her appointment for Monday.

## 2017-07-24 ENCOUNTER — Encounter: Payer: BLUE CROSS/BLUE SHIELD | Admitting: Obstetrics and Gynecology

## 2017-07-28 ENCOUNTER — Encounter: Payer: Self-pay | Admitting: Obstetrics & Gynecology

## 2017-07-28 ENCOUNTER — Other Ambulatory Visit: Payer: BLUE CROSS/BLUE SHIELD

## 2017-07-28 ENCOUNTER — Ambulatory Visit: Payer: BLUE CROSS/BLUE SHIELD | Admitting: Obstetrics & Gynecology

## 2017-07-28 VITALS — BP 142/98 | HR 82 | Ht 66.0 in | Wt 158.0 lb

## 2017-07-28 DIAGNOSIS — O039 Complete or unspecified spontaneous abortion without complication: Secondary | ICD-10-CM

## 2017-07-28 NOTE — Progress Notes (Signed)
Obstetric Problem Visit   Chief Complaint: Miscarriage  History of Present Illness: Patient is a 32 y.o. [id number] Unknown presenting for first trimester bleeding.  The onset of bleeding was last weekend, and has since stopped.  Crampy pains, resolved.  Duration 3 days.  No modifiers or other assoc sx's.  No further nausea or Breast T.  Is bleeding equal to or greater than normal menstrual flow:  Yes Any recent trauma:  No Recent intercourse:  No History of prior miscarriage:  Yes Prior ultrasound demonstrating IUP:  No Prior ultrasound demonstrating viable IUP:  No Prior Serum HCG:  Yes  1300 (in [place] on Saturday) Rh status: +  PMHx: She  has a past medical history of Anemia, Heart murmur, Hypertension, Migraine, and Vaginal Pap smear, abnormal. Also,  has a past surgical history that includes Knee surgery; Tonsillectomy; Wisdom tooth extraction; and Dilation and evacuation (04/27/2017)., family history includes Diabetes in her maternal grandfather; Melanoma (age of onset: 12) in her maternal aunt; Melanoma (age of onset: 14) in her maternal grandmother.,  reports that she quit smoking about 6 years ago. Her smoking use included cigarettes. She has a 0.50 pack-year smoking history. She has never used smokeless tobacco. She reports that she does not drink alcohol or use drugs.  She has a current medication list which includes the following prescription(s): acetaminophen, labetalol, and prenatal vitamin w/fe, fa. Also, is allergic to coconut flavor; sulfa antibiotics; coconut oil; codeine; and codone [hydrocodone].  Review of Systems  Constitutional: Negative for chills, fever and malaise/fatigue.  HENT: Negative for congestion, sinus pain and sore throat.   Eyes: Negative for blurred vision and pain.  Respiratory: Negative for cough and wheezing.   Cardiovascular: Negative for chest pain and leg swelling.  Gastrointestinal: Negative for abdominal pain, constipation, diarrhea, heartburn,  nausea and vomiting.  Genitourinary: Negative for dysuria, frequency, hematuria and urgency.  Musculoskeletal: Negative for back pain, joint pain, myalgias and neck pain.  Skin: Negative for itching and rash.  Neurological: Negative for dizziness, tremors and weakness.  Endo/Heme/Allergies: Does not bruise/bleed easily.  Psychiatric/Behavioral: Negative for depression. The patient is not nervous/anxious and does not have insomnia.     Objective: Vitals:   07/28/17 1019  BP: (!) 142/98  Pulse: 82   Physical Exam  Constitutional: She is oriented to person, place, and time. She appears well-developed and well-nourished. No distress.  Musculoskeletal: Normal range of motion.  Neurological: She is alert and oriented to person, place, and time.  Skin: Skin is warm and dry.  Psychiatric: She has a normal mood and affect.  Vitals reviewed.  Assessment: 32 y.o. [id number] New Miscarriage/ complete (likely) spontaneous abortion  1. Miscarriage - Beta hCG quant today in follow up from previous level  1) First trimester bleeding - incidence and clinical course of first trimester bleeding is discussed in detail with the patient today.  Approximately 1/3 of pregnancies ending in live births experienced 1st trimester bleeding.  The amount of bleeding is variable and not necessarily predictive of outcome.  Sources may be cervical or uterine.  Subchorionic hemorrhages are a frequent concurrent findings on ultrasound and are followed expectantly.  These often absorb or regress spontaneously although risk for expansion and further disruption of the utero-placental interface leading to miscarriage is possible.  There is no clearly documented benefit to limiting or modifying activity and sexual intercourse in altering clinic course of 1st trimester bleeding.    2) will trend HCG levels.  3) The patient is Rh +,  rhogam is therefore not indicated to decrease the risk rhesus alloimmunization.    4) Routine  bleeding precautions were discussed with the patient prior the conclusion of today's visit.  5) No contraception desired, but will wait to try again for several mos for pregnancy  Annamarie Major, MD, [facility] Frederick Ob/Gyn, [facility] 07/28/2017  10:58 AM

## 2017-07-29 LAB — BETA HCG QUANT (REF LAB): HCG QUANT: 35 m[IU]/mL

## 2017-10-15 ENCOUNTER — Other Ambulatory Visit: Payer: Self-pay | Admitting: Obstetrics and Gynecology

## 2017-10-15 DIAGNOSIS — O10919 Unspecified pre-existing hypertension complicating pregnancy, unspecified trimester: Secondary | ICD-10-CM

## 2017-11-13 ENCOUNTER — Other Ambulatory Visit: Payer: Self-pay | Admitting: Obstetrics & Gynecology

## 2017-11-13 ENCOUNTER — Other Ambulatory Visit: Payer: BLUE CROSS/BLUE SHIELD

## 2017-11-13 ENCOUNTER — Encounter: Payer: Self-pay | Admitting: Obstetrics & Gynecology

## 2017-11-13 DIAGNOSIS — N96 Recurrent pregnancy loss: Secondary | ICD-10-CM

## 2017-11-14 LAB — BETA HCG QUANT (REF LAB): HCG QUANT: 767 m[IU]/mL

## 2017-11-15 ENCOUNTER — Other Ambulatory Visit: Payer: BLUE CROSS/BLUE SHIELD

## 2017-11-15 DIAGNOSIS — N96 Recurrent pregnancy loss: Secondary | ICD-10-CM

## 2017-11-16 ENCOUNTER — Other Ambulatory Visit: Payer: Self-pay | Admitting: Obstetrics & Gynecology

## 2017-11-16 ENCOUNTER — Telehealth: Payer: Self-pay | Admitting: Obstetrics & Gynecology

## 2017-11-16 DIAGNOSIS — N926 Irregular menstruation, unspecified: Secondary | ICD-10-CM

## 2017-11-16 LAB — BETA HCG QUANT (REF LAB): HCG QUANT: 1500 m[IU]/mL

## 2017-11-16 NOTE — Telephone Encounter (Signed)
-----   Message from Nadara Mustardobert P Harris, MD sent at 11/16/2017  7:26 AM EDT ----- Sch Early OB US and NOB w PH for AUG 30, OK to overbook, must be this day; pt aware and awaiting call back

## 2017-11-16 NOTE — Progress Notes (Signed)
Sch Early OB US and NOB w PH for AUG 30, OK to overbook, must be this day; pt aware and awaiting call back

## 2017-11-16 NOTE — Telephone Encounter (Signed)
Patient is schedule 11/16/17 with u/s and NOB with [facility]

## 2017-11-22 ENCOUNTER — Encounter: Payer: Self-pay | Admitting: Obstetrics & Gynecology

## 2017-11-22 ENCOUNTER — Ambulatory Visit (INDEPENDENT_AMBULATORY_CARE_PROVIDER_SITE_OTHER): Payer: BLUE CROSS/BLUE SHIELD | Admitting: Obstetrics & Gynecology

## 2017-11-22 ENCOUNTER — Ambulatory Visit (INDEPENDENT_AMBULATORY_CARE_PROVIDER_SITE_OTHER): Payer: BLUE CROSS/BLUE SHIELD

## 2017-11-22 VITALS — BP 120/80 | Wt 161.0 lb

## 2017-11-22 DIAGNOSIS — N8311 Corpus luteum cyst of right ovary: Secondary | ICD-10-CM

## 2017-11-22 DIAGNOSIS — N926 Irregular menstruation, unspecified: Secondary | ICD-10-CM

## 2017-11-22 DIAGNOSIS — O3411 Maternal care for benign tumor of corpus uteri, first trimester: Secondary | ICD-10-CM

## 2017-11-22 DIAGNOSIS — Z3A01 Less than 8 weeks gestation of pregnancy: Secondary | ICD-10-CM | POA: Diagnosis not present

## 2017-11-22 DIAGNOSIS — O200 Threatened abortion: Secondary | ICD-10-CM

## 2017-11-22 NOTE — Progress Notes (Signed)
  HPI: Pt has missed period and has h/o 3 other miscarriages.  No pain or bleeding.  She has nausea, worse today. Beta hCG 767, then 1500 on 8/19 and 8/21, respectively Ultrasound demonstrates gestation sac, small, and no CRL or yolk sac These findings are inconclusive for date or viability of pregnancy  PMHx: She  has a past medical history of Anemia, Heart murmur, Hypertension, Migraine, and Vaginal Pap smear, abnormal. Also,  has a past surgical history that includes Knee surgery; Tonsillectomy; Wisdom tooth extraction; and Dilation and evacuation (04/27/2017)., family history includes Diabetes in her maternal grandfather; Melanoma (age of onset: 750) in her maternal aunt; Melanoma (age of onset: 9772) in her maternal grandmother.,  reports that she quit smoking about 6 years ago. Her smoking use included cigarettes. She has a 0.50 pack-year smoking history. She has never used smokeless tobacco. She reports that she does not drink alcohol or use drugs.  She has a current medication list which includes the following prescription(s): labetalol, prenatal vitamin w/fe, fa, and acetaminophen. Also, is allergic to coconut flavor; sulfa antibiotics; coconut oil; codeine; and codone [hydrocodone].  Review of Systems  All other systems reviewed and are negative.  Objective: BP 120/80   Wt 161 lb (73 kg)   LMP 10/01/2017   BMI 25.99 kg/m   Physical examination Constitutional NAD, Conversant  Skin No rashes, lesions or ulceration.   Extremities: Moves all appropriately.  Normal ROM for age. No lymphadenopathy.  Neuro: Grossly intact  Psych: Oriented to PPT.  Normal mood. Normal affect.   [place] Ob Comp Less 14 Wks  Result Date: 11/22/2017 ULTRASOUND REPORT Patient Name: Angela Weaver DOB: Nov 14, 1985 MRN: [id number] Location: [facility] Date of Service: 11/22/2017 Indications:dating Findings: Single gestational sac is seen, but without a yolk sac or embryonic pole. Gestational sac is too small  to calculate a gestational age. FHR: n/a CRL measurement: n/a Yolk sac is not visualized and early anatomy is normal. Amnion: not visualized Right Ovary is not normal in appearance. - ovarian cyst measuring 3.35 x 3.81cm Left Ovary is normal appearance. Corpus luteal cyst:  Right ovary Survey of the adnexa demonstrates no adnexal masses. There is no free peritoneal fluid in the cul de sac. Impression: 1. Single gestational sac is seen, but without a yolk sac or embryonic pole. 2. Gestational sac is too small to calculate a gestational age. 3. Right ovarian cyst Recommendations: 1.Clinical correlation with the patient's History and Physical Exam. Willette AlmaKristen Priestley, RDMS, RVT Review of ULTRASOUND.    [facility] personally reviewed images and [facility]    Plan of management to be discussed with patient. Annamarie MajorPaul Alanda Colton, MD, FACOG [facility], [facility] 11/22/2017  1:55 PM   Assessment:  Threatened miscarriage - Plan: Beta hCG quant (ref lab)    Too early vs miscarriage or blighted ovum     Beta today     US based on results next week PNV Nausea, no meds desired; fluids and diet discussed NOB once established viability    Labs then  Annamarie MajorPaul Vang Kraeger, MD, Merlinda FrederickFACOG [facility], [facility] 11/22/2017  1:55 PM

## 2017-11-23 ENCOUNTER — Other Ambulatory Visit: Payer: Self-pay | Admitting: Obstetrics & Gynecology

## 2017-11-23 ENCOUNTER — Telehealth: Payer: Self-pay | Admitting: Obstetrics & Gynecology

## 2017-11-23 DIAGNOSIS — O2620 Pregnancy care for patient with recurrent pregnancy loss, unspecified trimester: Secondary | ICD-10-CM

## 2017-11-23 LAB — BETA HCG QUANT (REF LAB): hCG Quant: 16987 m[IU]/mL

## 2017-11-23 NOTE — Progress Notes (Signed)
Sch OB Early US for next Wed Sept 4 [facility]

## 2017-11-23 NOTE — Telephone Encounter (Signed)
Called and schedule patient for 11/29/17 [facility] u/s [facility]

## 2017-11-23 NOTE — Telephone Encounter (Signed)
-----   Message from Nadara Mustardobert P Harris, MD sent at 11/23/2017  7:36 AM EDT ----- Sch OB Early US for next Wed Sept 4 and PH f/u ok to overbook, let her know

## 2017-11-24 ENCOUNTER — Encounter: Payer: BLUE CROSS/BLUE SHIELD | Admitting: Obstetrics & Gynecology

## 2017-11-24 ENCOUNTER — Other Ambulatory Visit: Payer: BLUE CROSS/BLUE SHIELD

## 2017-11-29 ENCOUNTER — Ambulatory Visit (INDEPENDENT_AMBULATORY_CARE_PROVIDER_SITE_OTHER): Payer: BLUE CROSS/BLUE SHIELD

## 2017-11-29 ENCOUNTER — Ambulatory Visit (INDEPENDENT_AMBULATORY_CARE_PROVIDER_SITE_OTHER): Payer: BLUE CROSS/BLUE SHIELD | Admitting: Obstetrics & Gynecology

## 2017-11-29 VITALS — BP 130/80 | Wt 161.0 lb

## 2017-11-29 DIAGNOSIS — N83201 Unspecified ovarian cyst, right side: Secondary | ICD-10-CM

## 2017-11-29 DIAGNOSIS — Z3491 Encounter for supervision of normal pregnancy, unspecified, first trimester: Secondary | ICD-10-CM

## 2017-11-29 DIAGNOSIS — N96 Recurrent pregnancy loss: Secondary | ICD-10-CM | POA: Diagnosis not present

## 2017-11-29 DIAGNOSIS — Z3A01 Less than 8 weeks gestation of pregnancy: Secondary | ICD-10-CM

## 2017-11-29 DIAGNOSIS — O2620 Pregnancy care for patient with recurrent pregnancy loss, unspecified trimester: Secondary | ICD-10-CM

## 2017-11-29 DIAGNOSIS — O2621 Pregnancy care for patient with recurrent pregnancy loss, first trimester: Secondary | ICD-10-CM | POA: Diagnosis not present

## 2017-11-29 DIAGNOSIS — O3481 Maternal care for other abnormalities of pelvic organs, first trimester: Secondary | ICD-10-CM

## 2017-11-29 DIAGNOSIS — N926 Irregular menstruation, unspecified: Secondary | ICD-10-CM

## 2017-11-29 NOTE — Progress Notes (Signed)
  HPI: Early pregnancy an dh/o prior miscarriages.  No bleeding.  Mild RLQ pain intermittant without radiation and no assoc sx's or modifiers.  Some lightheadedness at times esp after taking Labetalol  Ultrasound demonstrates 6 wk 4 day CRL w 122 FHTs.  Right ovarian cyst.  PMHx: She  has a past medical history of Anemia, Heart murmur, Hypertension, Migraine, and Vaginal Pap smear, abnormal. Also,  has a past surgical history that includes Knee surgery; Tonsillectomy; Wisdom tooth extraction; and Dilation and evacuation (04/27/2017)., family history includes Diabetes in her maternal grandfather; Melanoma (age of onset: 47) in her maternal aunt; Melanoma (age of onset: 40) in her maternal grandmother.,  reports that she quit smoking about 6 years ago. Her smoking use included cigarettes. She has a 0.50 pack-year smoking history. She has never used smokeless tobacco. She reports that she does not drink alcohol or use drugs.  She has a current medication list which includes the following prescription(s): acetaminophen, labetalol, and prenatal vitamin w/fe, fa. Also, is allergic to coconut flavor; sulfa antibiotics; coconut oil; codeine; and codone [hydrocodone].  Review of Systems  All other systems reviewed and are negative.  Objective: BP 130/80   Wt 161 lb (73 kg)   LMP 10/01/2017   BMI 25.99 kg/m   Physical examination Constitutional NAD, Conversant  Skin No rashes, lesions or ulceration.   Extremities: Moves all appropriately.  Normal ROM for age. No lymphadenopathy.  Neuro: Grossly intact  Psych: Oriented to PPT.  Normal mood. Normal affect.   Assessment: History of Miscarriage, Irreg cycles  Supervision of low-risk pregnancy, first trimester - Plan: RPR+Rh+ABO+Rub Ab+Ab Scr+CB..., US Fetal Nuchal Translucency Measurement First screen nv Decrease Labetalol to 100 mg BID due to side effects NOB nv Labs today, urine testing nv (culture and GC/Chl in urine) PAP UTD Flu shot nv  A  total of 15 minutes were spent face-to-face with the patient during this encounter and over half of that time dealt with counseling and coordination of care.  Annamarie Major, MD, Merl[facility] Frederick Ob/G[facility], [facility] 11/29/2017  2:08 PM

## 2017-11-29 NOTE — Patient Instructions (Signed)

## 2017-11-30 LAB — RPR+RH+ABO+RUB AB+AB SCR+CB...
Antibody Screen: NEGATIVE
HEMOGLOBIN: 12.9 g/dL (ref 11.1–15.9)
HEP B S AG: NEGATIVE
HIV SCREEN 4TH GENERATION: NONREACTIVE
Hematocrit: 37.7 % (ref 34.0–46.6)
MCH: 28 pg (ref 26.6–33.0)
MCHC: 34.2 g/dL (ref 31.5–35.7)
MCV: 82 fL (ref 79–97)
PLATELETS: 265 10*3/uL (ref 150–450)
RBC: 4.6 x10E6/uL (ref 3.77–5.28)
RDW: 13 % (ref 12.3–15.4)
RPR: NONREACTIVE
RUBELLA: 1.87 {index} (ref >0.99)
Rh Factor: POSITIVE
WBC: 6.3 10*3/uL (ref 3.4–10.8)

## 2017-12-10 ENCOUNTER — Encounter: Payer: Self-pay | Admitting: Obstetrics & Gynecology

## 2017-12-11 ENCOUNTER — Other Ambulatory Visit: Payer: Self-pay | Admitting: Obstetrics & Gynecology

## 2017-12-11 MED ORDER — DOXYLAMINE-PYRIDOXINE 10-10 MG PO TBEC: 2.0000 | DELAYED_RELEASE_TABLET | Freq: Every day | ORAL | 5 refills | Status: DC

## 2017-12-29 ENCOUNTER — Ambulatory Visit (INDEPENDENT_AMBULATORY_CARE_PROVIDER_SITE_OTHER): Payer: BLUE CROSS/BLUE SHIELD

## 2017-12-29 ENCOUNTER — Ambulatory Visit (INDEPENDENT_AMBULATORY_CARE_PROVIDER_SITE_OTHER): Payer: BLUE CROSS/BLUE SHIELD | Admitting: Obstetrics & Gynecology

## 2017-12-29 VITALS — BP 130/80 | Wt 159.0 lb

## 2017-12-29 DIAGNOSIS — Z3481 Encounter for supervision of other normal pregnancy, first trimester: Secondary | ICD-10-CM

## 2017-12-29 DIAGNOSIS — O3411 Maternal care for benign tumor of corpus uteri, first trimester: Secondary | ICD-10-CM | POA: Diagnosis not present

## 2017-12-29 DIAGNOSIS — Z23 Encounter for immunization: Secondary | ICD-10-CM | POA: Diagnosis not present

## 2017-12-29 DIAGNOSIS — N8311 Corpus luteum cyst of right ovary: Secondary | ICD-10-CM

## 2017-12-29 DIAGNOSIS — Z3A11 11 weeks gestation of pregnancy: Secondary | ICD-10-CM

## 2017-12-29 DIAGNOSIS — Z3491 Encounter for supervision of normal pregnancy, unspecified, first trimester: Secondary | ICD-10-CM

## 2017-12-29 DIAGNOSIS — Z3A10 10 weeks gestation of pregnancy: Secondary | ICD-10-CM

## 2017-12-29 DIAGNOSIS — Z369 Encounter for antenatal screening, unspecified: Secondary | ICD-10-CM

## 2017-12-29 NOTE — Progress Notes (Signed)
  Subjective  Fetal Movement? no Contractions? no Leaking Fluid? no Vaginal Bleeding? no Less nausea. No ha, blurry vision, epig pain, CP, SOB Objective  BP 130/80   Wt 159 lb (72.1 kg)   LMP 10/01/2017   BMI 25.66 kg/m  General: NAD Pumonary: no increased work of breathing Abdomen: gravid, non-tender Extremities: no edema Psychiatric: mood appropriate, affect full  Assessment  32 y.o. G7P3033 at [redacted]w[redacted]d by  07/21/2018, by Ultrasound presenting for routine prenatal visit  Plan   Problem List Items Addressed This Visit      Other   Supervision of low-risk pregnancy, first trimester    Other Visit Diagnoses    Prenatal screening encounter    -  Primary   Relevant Orders   Urine Culture   GC probe amplification, urine   First Trimester Screen w/NT   [redacted] weeks gestation of pregnancy        Labetalol 100 mg BID First screen today Urine testing today Flu shot today  Review of ULTRASOUND.    I have personally reviewed images and report of recent ultrasound done at [facility].    There is a viable  singleton gestation.  The fetal biometry correlates with established dating.  Detailed evaluation of the fetal anatomy is precluded by early gestational age.The NT [phone] Mission PraiW646 [phone] HeaQ519 130 [phone] Specialty [phone] [phone] Health [phone]([phone] [phone] (224)West Virginiaedical CenterkCamera operatorasound is unable to definitively rule out fetal aneuploidy.   Paul Kareem Cathey, MD, FACOG [facility], [facility] 12/29/2017  10:22 AM

## 2017-12-29 NOTE — Patient Instructions (Signed)

## 2017-12-31 LAB — URINE CULTURE: ORGANISM ID, BACTERIA: NO GROWTH

## 2018-01-01 ENCOUNTER — Other Ambulatory Visit (HOSPITAL_COMMUNITY)
Admission: RE | Admit: 2018-01-01 | Discharge: 2018-01-01 | Disposition: A | Discharge status: Home / Self Care | Payer: BLUE CROSS/BLUE SHIELD | Source: Ambulatory Visit | Attending: Obstetrics & Gynecology | Admitting: Obstetrics & Gynecology

## 2018-01-01 DIAGNOSIS — Z369 Encounter for antenatal screening, unspecified: Secondary | ICD-10-CM | POA: Diagnosis present

## 2018-01-01 LAB — URINE CYTOLOGY ANCILLARY ONLY
Chlamydia: NEGATIVE
Neisseria Gonorrhea: NEGATIVE

## 2018-01-01 NOTE — Addendum Note (Signed)
Addended by: Kathlene Cote on: 01/01/2018 10:09 AM   Modules accepted: Orders, SmartSet

## 2018-01-02 ENCOUNTER — Other Ambulatory Visit: Payer: Self-pay | Admitting: Obstetrics & Gynecology

## 2018-01-02 LAB — FIRST TRIMESTER SCREEN W/NT
CRL: 43.4 mm
DIA MOM: 1.32
DIA VALUE: 367.1 pg/mL
Gest Age-Collect: 11 weeks
HCG MOM: 1.48
MATERNAL AGE AT EDD: 34.1 a
NUMBER OF FETUSES: 1
Nuchal Translucency MoM: 0.68
Nuchal Translucency: 0.8 mm
PAPP-A MoM: 1.16
PAPP-A Value: 538.2 ng/mL
TEST RESULTS: NEGATIVE
Weight: 159 [lb_av]
hCG Value: 169.7 IU/mL

## 2018-01-02 MED ORDER — METOCLOPRAMIDE HCL 10 MG PO TABS: 10.0000 mg | ORAL_TABLET | Freq: Three times a day (TID) | ORAL | 2 refills | Status: DC

## 2018-01-02 MED ORDER — VITAMIN B-6 25 MG PO TABS: 25.0000 mg | ORAL_TABLET | Freq: Three times a day (TID) | ORAL | 3 refills | Status: DC

## 2018-01-26 ENCOUNTER — Encounter: Payer: Self-pay | Admitting: Obstetrics & Gynecology

## 2018-01-26 ENCOUNTER — Ambulatory Visit (INDEPENDENT_AMBULATORY_CARE_PROVIDER_SITE_OTHER): Payer: BLUE CROSS/BLUE SHIELD | Admitting: Obstetrics & Gynecology

## 2018-01-26 VITALS — BP 130/80 | Wt 162.0 lb

## 2018-01-26 DIAGNOSIS — O10912 Unspecified pre-existing hypertension complicating pregnancy, second trimester: Secondary | ICD-10-CM

## 2018-01-26 DIAGNOSIS — Z3A14 14 weeks gestation of pregnancy: Secondary | ICD-10-CM

## 2018-01-26 DIAGNOSIS — O10919 Unspecified pre-existing hypertension complicating pregnancy, unspecified trimester: Secondary | ICD-10-CM

## 2018-01-26 DIAGNOSIS — Z3491 Encounter for supervision of normal pregnancy, unspecified, first trimester: Secondary | ICD-10-CM

## 2018-01-26 NOTE — Progress Notes (Signed)
  Subjective  Fetal Movement? yes Contractions? no Leaking Fluid? no Vaginal Bleeding? no Min nausea Objective  BP 130/80   Wt 162 lb (73.5 kg)   LMP 10/01/2017   BMI 26.15 kg/m  General: NAD Pumonary: no increased work of breathing Abdomen: gravid, non-tender Extremities: no edema Psychiatric: mood appropriate, affect full  Assessment  32 y.o. [id number] at [redacted]w[redacted]d by  07/21/2018, by Ultrasound presenting for routine prenatal visit  Plan   Problem List Items Addressed This Visit      Cardiovascular and Mediastinum   Pre-existing hypertension during pregnancy, antepartum     Other   Supervision of low-risk pregnancy, first trimester    Other Visit Diagnoses    [redacted] weeks gestation of pregnancy    -  Primary    [place] nv.  PNV.  Annamarie Major, MD, [facility], [facility] 01/26/2018  9:46 AM

## 2018-02-15 ENCOUNTER — Other Ambulatory Visit: Payer: Self-pay | Admitting: Obstetrics & Gynecology

## 2018-02-15 DIAGNOSIS — O0992 Supervision of high risk pregnancy, unspecified, second trimester: Secondary | ICD-10-CM

## 2018-02-21 ENCOUNTER — Ambulatory Visit (INDEPENDENT_AMBULATORY_CARE_PROVIDER_SITE_OTHER): Payer: BLUE CROSS/BLUE SHIELD

## 2018-02-21 ENCOUNTER — Encounter: Payer: Self-pay | Admitting: Obstetrics & Gynecology

## 2018-02-21 ENCOUNTER — Ambulatory Visit (INDEPENDENT_AMBULATORY_CARE_PROVIDER_SITE_OTHER): Payer: BLUE CROSS/BLUE SHIELD | Admitting: Obstetrics & Gynecology

## 2018-02-21 VITALS — BP 122/88 | Wt 164.0 lb

## 2018-02-21 DIAGNOSIS — Z3482 Encounter for supervision of other normal pregnancy, second trimester: Secondary | ICD-10-CM

## 2018-02-21 DIAGNOSIS — O0992 Supervision of high risk pregnancy, unspecified, second trimester: Secondary | ICD-10-CM

## 2018-02-21 DIAGNOSIS — Z3A18 18 weeks gestation of pregnancy: Secondary | ICD-10-CM

## 2018-02-21 DIAGNOSIS — O10912 Unspecified pre-existing hypertension complicating pregnancy, second trimester: Secondary | ICD-10-CM

## 2018-02-21 DIAGNOSIS — O10919 Unspecified pre-existing hypertension complicating pregnancy, unspecified trimester: Secondary | ICD-10-CM

## 2018-02-21 DIAGNOSIS — Z363 Encounter for antenatal screening for malformations: Secondary | ICD-10-CM | POA: Diagnosis not present

## 2018-02-21 LAB — POCT URINALYSIS DIPSTICK OB
GLUCOSE, UA: NEGATIVE
PROTEIN: NEGATIVE

## 2018-02-21 MED ORDER — LABETALOL HCL 100 MG PO TABS: 100.0000 mg | ORAL_TABLET | Freq: Two times a day (BID) | ORAL | 11 refills | Status: DC

## 2018-02-21 NOTE — Progress Notes (Signed)
  Subjective  Fetal Movement? rare Contractions? no Leaking Fluid? no Vaginal Bleeding? no  Objective  BP 122/88   Wt 164 lb (74.4 kg)   LMP 10/01/2017   BMI 26.47 kg/m  General: NAD Pumonary: no increased work of breathing Abdomen: gravid, non-tender Extremities: no edema Psychiatric: mood appropriate, affect full  Assessment  32 y.o. [id number] at 3224w4d by  07/21/2018, by Ultrasound presenting for routine prenatal visit  Plan   Problem List Items Addressed This Visit      Cardiovascular and Mediastinum   Pre-existing hypertension during pregnancy, antepartum   Relevant Medications   labetalol (NORMODYNE) 100 MG tablet     Other   Supervision of low-risk pregnancy, first trimester    Other Visit Diagnoses    [redacted] weeks gestation of pregnancy    -  Primary    Review of ULTRASOUND. I have personally reviewed images and report of [facility] There is a singleton gestation with subjectively normal amniotic fluid volume. The fetal biometry correlates with established dating. Detailed evaluation of the fetal anatomy was performed.The fetal anatomical survey appears within normal limits within the resolution of ultrasound as described above.  It must be noted that a normal ultrasound is unable to rule out fetal aneuploidy.   Repeat US nv for a couple of views not seen clearly today. Monitor BP.  Cont Labetalol 100 mg BID  Annamarie MajorPaul Harris, MD, Merlinda FrederickFACOG [facility], [facility] 02/21/2018  9:24 AM

## 2018-02-21 NOTE — Addendum Note (Signed)
Addended by: Cornelius MorasPATTERSON, Tynell Winchell D on: 02/21/2018 09:27 AM   Modules accepted: Orders

## 2018-02-23 ENCOUNTER — Encounter: Payer: Self-pay | Admitting: Obstetrics & Gynecology

## 2018-03-12 ENCOUNTER — Ambulatory Visit
Admission: EM | Admit: 2018-03-12 | Discharge: 2018-03-12 | Disposition: A | Discharge status: Home / Self Care | Payer: BLUE CROSS/BLUE SHIELD | Attending: Family Medicine | Admitting: Family Medicine

## 2018-03-12 ENCOUNTER — Other Ambulatory Visit: Payer: Self-pay

## 2018-03-12 ENCOUNTER — Encounter: Payer: Self-pay | Admitting: Emergency Medicine

## 2018-03-12 DIAGNOSIS — Z87891 Personal history of nicotine dependence: Secondary | ICD-10-CM | POA: Diagnosis not present

## 2018-03-12 DIAGNOSIS — J0100 Acute maxillary sinusitis, unspecified: Secondary | ICD-10-CM

## 2018-03-12 DIAGNOSIS — R059 Cough, unspecified: Secondary | ICD-10-CM

## 2018-03-12 DIAGNOSIS — R05 Cough: Secondary | ICD-10-CM | POA: Insufficient documentation

## 2018-03-12 DIAGNOSIS — R030 Elevated blood-pressure reading, without diagnosis of hypertension: Secondary | ICD-10-CM | POA: Insufficient documentation

## 2018-03-12 MED ORDER — AMOXICILLIN-POT CLAVULANATE 875-125 MG PO TABS: 1.0000 | ORAL_TABLET | Freq: Two times a day (BID) | ORAL | 0 refills | Status: DC

## 2018-03-12 NOTE — ED Triage Notes (Signed)
Patient c/o sinus congestion and pressure for the past 2 weeks.  Patient reports cough and chest congestion for 2 days.  Patient denies fevers.

## 2018-03-12 NOTE — ED Provider Notes (Signed)
[facility] ____________________________________________  Time seen: Approximately 5:48 PM  I have reviewed the triage vital signs and the nursing notes.   HISTORY  Chief Complaint Cough (APPOINTMENT)  HPI Angela Weaver is a 32 y.o. female who is [redacted] weeks pregnant with history of hypertension presenting for evaluation of 2 weeks of runny nose, nasal congestion, sinus pressure and sinus drainage.  Reports for the last 2 days she has had increased postnasal drainage and cough.  Denies chest pain or shortness of breath.  No known fevers.  Overall continues to eat and drink well.  Has taken occasional Tylenol, no other over-the-counter medications taken for the same complaints.  Denies accompanying abdominal pain, vaginal discharge, vaginal bleeding.  States her blood pressure has been elevated intermittently recently during her pregnancy, and states that she has been following closely with her OB/GYN and closely monitoring her blood pressure at home.  States her blood pressure readings at home has been in the upper 130s over 80s to 90, and states that her OB/GYN is aware of this.  Denies any atypical lower extremity edema or lasting edema.  No accompanying dizziness or atypical headaches.  States sinus pain to bilateral cheeks, states feels similar to previous sinus infections.  States her children have recently been sick with similar at home.  Patient reports a gravida 7, para 3, miscarriage 3.  Reports otherwise doing well denies other complaints.  OBGYNRoselee Nova   Past Medical History:  Diagnosis Date  . Anemia   . Heart murmur    SAW CARDIOLOGIST IN 2011 AND EVERYTHING WAS FINE PER PT-  ASYPMTOMATIC  . Hypertension   . Migraine    migraines  . Vaginal Pap smear, abnormal    LGSIL    Patient Active Problem List   Diagnosis Date Noted  . Pre-existing hypertension during pregnancy, antepartum 01/26/2018  . Supervision of low-risk pregnancy, first trimester  12/29/2017  . Missed abortion 04/27/2017    Past Surgical History:  Procedure Laterality Date  . DILATION AND EVACUATION  04/27/2017   Procedure: DILATATION AND EVACUATION;  Surgeon: Nadara Mustard, MD;  Location: [facility] ORS;  Service: Gynecology;;  . KNEE SURGERY    . TONSILLECTOMY    . WISDOM TOOTH EXTRACTION       No current facility-administered medications for this encounter.   Current Outpatient Medications:  .  labetalol (NORMODYNE) 100 MG tablet, Take 1 tablet (100 mg total) by mouth 2 (two) times daily., Disp: 60 tablet, Rfl: 11 .  prenatal vitamin w/FE, FA (PRENATAL 1 + 1) 27-1 MG TABS tablet, Take 1 tablet by mouth every evening. , Disp: , Rfl:  .  acetaminophen (TYLENOL) 500 MG tablet, Take 500 mg by mouth every 6 (six) hours as needed (for pain.)., Disp: , Rfl:  .  amoxicillin-clavulanate (AUGMENTIN) 875-125 MG tablet, Take 1 tablet by mouth every 12 (twelve) hours., Disp: 20 tablet, Rfl: 0 .  metoCLOPramide (REGLAN) 10 MG tablet, Take 1 tablet (10 mg total) by mouth 3 (three) times daily before meals., Disp: 90 tablet, Rfl: 2 .  vitamin B-6 (PYRIDOXINE) 25 MG tablet, Take 1 tablet (25 mg total) by mouth 3 (three) times daily before meals., Disp: 90 tablet, Rfl: 3  Allergies Coconut flavor; Sulfa antibiotics; Coconut oil; Codeine; and Codone [hydrocodone]  Family History  Problem Relation Age of Onset  . Melanoma Maternal Aunt 50  . Melanoma Maternal Grandmother 72  . Diabetes Maternal Grandfather     Social History Social History  Tobacco Use  . Smoking status: Former Smoker    Packs/day: 0.25    Years: 2.00    Pack years: 0.50    Types: Cigarettes    Last attempt to quit: 04/26/2011    Years since quitting: 6.8  . Smokeless tobacco: Never Used  . Tobacco comment: more socially   Substance Use Topics  . Alcohol use: No  . Drug use: No    Review of Systems Constitutional: No fever Eyes: No visual changes. ENT: As above.  Cardiovascular: Denies chest  pain. Respiratory: Denies shortness of breath. Gastrointestinal: No abdominal pain.  Genitourinary: Negative for dysuria. Musculoskeletal: Negative for back pain. Skin: Negative for rash. Neurological: Negative for headaches, focal weakness or numbness.    ____________________________________________   PHYSICAL EXAM:  VITAL SIGNS: ED Triage Vitals  Enc Vitals Group     BP 03/12/18 1731 (!) 145/106     Pulse Rate 03/12/18 1731 84     Resp 03/12/18 1731 16     Temp 03/12/18 1731 98.2 F (36.8 C)     Temp Source 03/12/18 1731 Oral     SpO2 03/12/18 1731 100 %     Weight 03/12/18 1728 163 lb (73.9 kg)     Height 03/12/18 1728 5\' 6"  (1.676 m)     Head Circumference --      Peak Flow --      Pain Score 03/12/18 1728 3     Pain Loc --      Pain Edu? --      Excl. in GC? --    Vitals:   03/12/18 1728 03/12/18 1731 03/12/18 1750  BP:  (!) 145/106 (!) 146/88  Pulse:  84   Resp:  16   Temp:  98.2 F (36.8 C)   TempSrc:  Oral   SpO2:  100%   Weight: 163 lb (73.9 kg)    Height: 5\' 6"  (1.676 m)     Fetal heart tones: 156bpm per RN   Constitutional: Alert and oriented. Well appearing and in no acute distress. Eyes: Conjunctivae are normal.  Head: Atraumatic.Mild to moderate tenderness to palpation bilateral frontal and maxillary sinuses. No swelling. No erythema.   Ears: no erythema, normal TMs bilaterally.   Nose: nasal congestion with bilateral nasal turbinate erythema and edema.   Mouth/Throat: Mucous membranes are moist.  Oropharynx non-erythematous.No tonsillar swelling or exudate.  Neck: No stridor.  No cervical spine tenderness to palpation. Hematological/Lymphatic/Immunilogical: No cervical lymphadenopathy. Cardiovascular: Normal rate, regular rhythm. Grossly normal heart sounds.  Good peripheral circulation. Respiratory: Normal respiratory effort.  No retractions.No wheezes, rales or rhonchi. Good air movement.  Gastrointestinal: Soft and nontender.  Gravid  abdomen. Musculoskeletal: Steady gait.  No lower extremity edema noted. Neurologic:  Normal speech and language. No gross focal neurologic deficits are appreciated. No gait instability. Skin:  Skin is warm, dry and intact. No rash noted. Psychiatric: Mood and affect are normal. Speech and behavior are normal.  ___________________________________________   LABS (all labs ordered are listed, but only abnormal results are displayed)  Labs Reviewed - No data to display   PROCEDURES Procedures    INITIAL IMPRESSION / ASSESSMENT AND PLAN / ED COURSE  Pertinent labs & imaging results that were available during my care of the patient were reviewed by me and considered in my medical decision making (see chart for details).  Well-appearing patient.  [redacted] weeks pregnant, followed closely with her OB/GYN due to known hypertension.  Counseled patient to continue to closely monitor at  home, keep a journal and follow-up with her OB/GYN this week.  Will treat with oral Augmentin for sinusitis.  Encouraged rest, fluids, supportive care.Discussed indication, risks and benefits of medications with patient.   Discussed follow up and return parameters including no resolution or any worsening concerns. Patient verbalized understanding and agreed to plan.   ____________________________________________   FINAL CLINICAL IMPRESSION(S) / ED DIAGNOSES  Final diagnoses:  Acute maxillary sinusitis, recurrence not specified  Cough  Elevated blood pressure reading     ED Discharge Orders         Ordered    amoxicillin-clavulanate (AUGMENTIN) 875-125 MG tablet  Every 12 hours     03/12/18 1751           Note: This dictation was prepared with Dragon dictation along with smaller phrase technology. Any transcriptional errors that result from this process are unintentional.         Renford DillsMiller, Nakyah Erdmann, NP 03/12/18 1836

## 2018-03-12 NOTE — Discharge Instructions (Addendum)
Take medication as prescribed. Rest. Drink plenty of fluids. Continue supportive care.   Follow up closely with your OBGYN, including communication of current blood pressure.   Follow up with your primary care physician this week as needed. Return to Urgent care for new or worsening concerns.

## 2018-03-22 ENCOUNTER — Ambulatory Visit (INDEPENDENT_AMBULATORY_CARE_PROVIDER_SITE_OTHER): Payer: BLUE CROSS/BLUE SHIELD | Admitting: Obstetrics & Gynecology

## 2018-03-22 ENCOUNTER — Ambulatory Visit (INDEPENDENT_AMBULATORY_CARE_PROVIDER_SITE_OTHER): Payer: BLUE CROSS/BLUE SHIELD

## 2018-03-22 VITALS — BP 128/88 | Wt 169.0 lb

## 2018-03-22 DIAGNOSIS — Z3482 Encounter for supervision of other normal pregnancy, second trimester: Secondary | ICD-10-CM

## 2018-03-22 DIAGNOSIS — Z362 Encounter for other antenatal screening follow-up: Secondary | ICD-10-CM | POA: Diagnosis not present

## 2018-03-22 DIAGNOSIS — Z131 Encounter for screening for diabetes mellitus: Secondary | ICD-10-CM

## 2018-03-22 DIAGNOSIS — Z3A22 22 weeks gestation of pregnancy: Secondary | ICD-10-CM

## 2018-03-22 DIAGNOSIS — O10919 Unspecified pre-existing hypertension complicating pregnancy, unspecified trimester: Secondary | ICD-10-CM

## 2018-03-22 NOTE — Progress Notes (Signed)
  Subjective  Fetal Movement? yes Contractions? no Leaking Fluid? no Vaginal Bleeding? no She had one episode of elevated BP readings when seen for cold sx's.  No headache or other sx's.  Objective  BP 128/88   Wt 169 lb (76.7 kg)   LMP 10/01/2017   BMI 27.28 kg/m  General: NAD Pumonary: no increased work of breathing Abdomen: gravid, non-tender Extremities: no edema Psychiatric: mood appropriate, affect full  Assessment  32 y.o. [id number] at 5737w5d by  07/21/2018, by Ultrasound presenting for routine prenatal visit with hisotry of chronic HTN on Labetalol  Plan   Problem List Items Addressed This Visit      Cardiovascular and Mediastinum   Pre-existing hypertension during pregnancy, antepartum    Other Visit Diagnoses    [redacted] weeks gestation of pregnancy    -  Primary   Encounter for supervision of other normal pregnancy in second trimester       Screening for diabetes mellitus       Relevant Orders   28 Week RH+Panel    Labetalol and BP monitoring [x ] Aspirin 81 mg daily after 12 weeks; discontinue after 36 weeks [x ] baseline labs with CBC, CMP, urine protein/creatinine ratio [ ]  ultrasound for growth at 32, 36 weeks  Current antihypertensives:  Labetalol   Baseline and surveillance labs ([facility], refresh links as needed)  Lab Results  Component Value Date   PLT 265 11/29/2017   CREATININE 0.63 03/15/2016   AST 32 03/15/2016   ALT 10 (L) 03/15/2016   PROTCRRATIO 0.12 03/15/2016   Antenatal Testing CHTN - O10.919  **Group I  BP < 140/90, no preeclampsia, AGA,  nml AFV, +/- meds    Group II BP > 140/90, on meds, no preeclampsia, AGA, nml AFV  US 20-34-38  20-24-28-32-35-38  APT 32 wk  28//BPP wkly then 32//2 x wk  40 no meds; 39 meds  PRN or 37  Pre-eclampsia  GHTN - O13.9/Preeclampsia without severe features  - O14.00   Preeclampsia with severe features - O14.10  Q 3-4wks  Q 2 wks  28//BPP wkly then 32//2 x wk  Inpatient  37  PRN  or 34    Annamarie MajorPaul Kaili Castille, MD, Merlinda FrederickFACOG [facility], [facility] 03/22/2018  10:40 AM

## 2018-03-28 NOTE — L&D Delivery Note (Signed)
Delivery Note Primary OB: [facility] Delivery Physician: Annamarie Major, MD Gestational Age: Full term Antepartum complications: Chronic Hypertension Intrapartum complications: None  A viable Female was delivered via vertex perentation.  Apgars:8 ,9  Weight:  6 lb 20 oz .   Placenta status: spontaneous and Intact.  Cord: 3+ vessels;  with the following complications: nuchal.  Anesthesia:  epidural Episiotomy:  none Lacerations:  1st Suture Repair: 2.0 vicryl Est. Blood Loss (mL):  130 mL QBL  Mom to postpartum.  Baby to Couplet care / Skin to Skin.  Annamarie Major, MD, [facility], [facility] 07/04/2018  12:20 PM [phone]

## 2018-04-03 ENCOUNTER — Encounter: Payer: Self-pay | Admitting: Emergency Medicine

## 2018-04-03 ENCOUNTER — Other Ambulatory Visit: Payer: Self-pay

## 2018-04-03 ENCOUNTER — Ambulatory Visit
Admission: EM | Admit: 2018-04-03 | Discharge: 2018-04-03 | Disposition: A | Discharge status: Home / Self Care | Payer: BLUE CROSS/BLUE SHIELD | Attending: Family Medicine | Admitting: Family Medicine

## 2018-04-03 DIAGNOSIS — J0101 Acute recurrent maxillary sinusitis: Secondary | ICD-10-CM | POA: Insufficient documentation

## 2018-04-03 DIAGNOSIS — B9789 Other viral agents as the cause of diseases classified elsewhere: Secondary | ICD-10-CM | POA: Diagnosis not present

## 2018-04-03 DIAGNOSIS — R0981 Nasal congestion: Secondary | ICD-10-CM

## 2018-04-03 DIAGNOSIS — R05 Cough: Secondary | ICD-10-CM | POA: Diagnosis not present

## 2018-04-03 DIAGNOSIS — J029 Acute pharyngitis, unspecified: Secondary | ICD-10-CM | POA: Diagnosis not present

## 2018-04-03 LAB — RAPID STREP SCREEN (MED CTR MEBANE ONLY): Streptococcus, Group A Screen (Direct): NEGATIVE

## 2018-04-03 MED ORDER — DEXTROMETHORPHAN HBR 15 MG/5ML PO SYRP: 10.0000 mL | ORAL_SOLUTION | Freq: Four times a day (QID) | ORAL | 0 refills | Status: DC | PRN

## 2018-04-03 MED ORDER — AZITHROMYCIN 250 MG PO TABS: 250.0000 mg | ORAL_TABLET | Freq: Every day | ORAL | 0 refills | Status: DC

## 2018-04-03 MED ORDER — BUDESONIDE 32 MCG/ACT NA SUSP: 2.0000 | Freq: Every day | NASAL | 0 refills | Status: DC

## 2018-04-03 NOTE — ED Triage Notes (Addendum)
Patient c/o sinus congestion and pressure that started 5-6 days ago.  Patient also reports sore throat and cough for the past 3 days.  Patient denies fevers.

## 2018-04-03 NOTE — ED Provider Notes (Signed)
[facility]    CSN: [id number] Arrival date & time: 04/03/18  [id number]     History   Chief Complaint Chief Complaint  Patient presents with  . Cough  . Sinus Problem    HPI Angela Weaver is a 33 y.o. female.   HPI  -year-old female who is currently [redacted] weeks pregnant presents with a recurrence of sinus congestion and pressure that started 5 to 6 days ago.  Also had a cough and sore throat for the past 3 days.  She denies any fevers.  She has had hypertension during pregnancy usually noticed when taking it with a machine but when taken manually appears normal.  Today is 138/82 manually.  Did improve while taking the Augmentin but 5 to 6 days ago started having the same symptoms again.  She states that she is staying up late due to the coughing.           Past Medical History:  Diagnosis Date  . Anemia   . Heart murmur    SAW CARDIOLOGIST IN 2011 AND EVERYTHING WAS FINE PER PT-  ASYPMTOMATIC  . Hypertension   . Migraine    migraines  . Vaginal Pap smear, abnormal    LGSIL    Patient Active Problem List   Diagnosis Date Noted  . Pre-existing hypertension during pregnancy, antepartum 01/26/2018  . Supervision of low-risk pregnancy, first trimester 12/29/2017  . Missed abortion 04/27/2017    Past Surgical History:  Procedure Laterality Date  . DILATION AND EVACUATION  04/27/2017   Procedure: DILATATION AND EVACUATION;  Surgeon: Nadara MustardHarris, Robert P, MD;  Location: [facility] ORS;  Service: Gynecology;;  . KNEE SURGERY    . TONSILLECTOMY    . WISDOM TOOTH EXTRACTION      OB History    Gravida  7   Para  3   Term  3   Preterm      AB  3   Living  3     SAB  3   TAB      Ectopic      Multiple  0   Live Births  3            Home Medications    Prior to Admission medications   Medication Sig Start Date End Date Taking? Authorizing Provider  acetaminophen (TYLENOL) 500 MG tablet Take 500 mg by mouth every 6 (six) hours as needed  (for pain.).   Yes [provider]  labetalol (NORMODYNE) 100 MG tablet Take 1 tablet (100 mg total) by mouth 2 (two) times daily. 02/21/18  Yes Nadara MustardHarris, Robert P, MD  prenatal vitamin w/FE, FA (PRENATAL 1 + 1) 27-1 MG TABS tablet Take 1 tablet by mouth every evening.    Yes [provider]  azithromycin (ZITHROMAX) 250 MG tablet Take 1 tablet (250 mg total) by mouth daily. Take first 2 tablets together, then 1 every day until finished. 04/03/18   Lutricia Feiloemer, Lareen Mullings P, PA-C  budesonide (RHINOCORT AQUA) 32 MCG/ACT nasal spray Place 2 sprays into both nostrils daily. 04/03/18   Lutricia Feiloemer, Chelesa Weingartner P, PA-C  dextromethorphan 15 MG/5ML syrup Take 10 mLs (30 mg total) by mouth 4 (four) times daily as needed for cough. 04/03/18   Lutricia Feiloemer, Thong Feeny P, PA-C    Family History Family History  Problem Relation Age of Onset  . Melanoma Maternal Aunt 50  . Melanoma Maternal Grandmother 72  . Diabetes Maternal Grandfather     Social History Social History  Tobacco Use  . Smoking status: Former Smoker    Packs/day: 0.25    Years: 2.00    Pack years: 0.50    Types: Cigarettes    Last attempt to quit: 04/26/2011    Years since quitting: 6.9  . Smokeless tobacco: Never Used  . Tobacco comment: more socially   Substance Use Topics  . Alcohol use: No  . Drug use: No     Allergies   Coconut flavor; Sulfa antibiotics; Coconut oil; Codeine; and Codone [hydrocodone]   Review of Systems Review of Systems  Constitutional: Positive for activity change. Negative for appetite change, chills, fatigue and fever.  HENT: Positive for congestion, postnasal drip, rhinorrhea, sinus pressure, sinus pain and sore throat.   Respiratory: Positive for cough.   All other systems reviewed and are negative.    Physical Exam Triage Vital Signs ED Triage Vitals  Enc Vitals Group     BP 04/03/18 0943 (!) 142/109     Pulse Rate 04/03/18 0943 98     Resp 04/03/18 0943 14     Temp 04/03/18 0943 98.1 F (36.7  C)     Temp Source 04/03/18 0943 Oral     SpO2 04/03/18 0943 100 %     Weight 04/03/18 0939 169 lb 1.5 oz (76.7 kg)     Height 04/03/18 0939 5\' 6"  (1.676 m)     Head Circumference --      Peak Flow --      Pain Score 04/03/18 0939 5     Pain Loc --      Pain Edu? --      Excl. in GC? --    No data found.  Updated Vital Signs BP 138/82 (BP Location: Left Arm)   Pulse 98   Temp 98.1 F (36.7 C) (Oral)   Resp 14   Ht 5\' 6"  (1.676 m)   Wt 169 lb 1.5 oz (76.7 kg)   LMP 10/01/2017   SpO2 100%   BMI 27.29 kg/m   Visual Acuity Right Eye Distance:   Left Eye Distance:   Bilateral Distance:    Right Eye Near:   Left Eye Near:    Bilateral Near:     Physical Exam Vitals signs and nursing note reviewed.  Constitutional:      General: She is not in acute distress.    Appearance: Normal appearance. She is not ill-appearing, toxic-appearing or diaphoretic.  HENT:     Head: Normocephalic.     Comments: She has a very tender maxillary sinuses bilaterally with percussion.  This is not present on the frontal.    Right Ear: Tympanic membrane, ear canal and external ear normal.     Left Ear: Tympanic membrane, ear canal and external ear normal.     Nose: Nose normal.     Mouth/Throat:     Mouth: Mucous membranes are moist.     Pharynx: No oropharyngeal exudate or posterior oropharyngeal erythema.  Eyes:     General:        Right eye: No discharge.        Left eye: No discharge.     Conjunctiva/sclera: Conjunctivae normal.  Neck:     Musculoskeletal: Normal range of motion.  Pulmonary:     Effort: Pulmonary effort is normal.     Breath sounds: Normal breath sounds.  Musculoskeletal: Normal range of motion.  Skin:    General: Skin is warm and dry.  Neurological:     General: No focal  deficit present.     Mental Status: She is alert and oriented to person, place, and time.  Psychiatric:        Mood and Affect: Mood normal.        Behavior: Behavior normal.        Thought  Content: Thought content normal.        Judgment: Judgment normal.      UC Treatments / Results  Labs (all labs ordered are listed, but only abnormal results are displayed) Labs Reviewed  RAPID STREP SCREEN ([facility] ONLY)  CULTURE, GROUP A STREP [facility])    EKG None  Radiology No results found.  Procedures Procedures (including critical care time)  Medications Ordered in UC Medications - No data to display  Initial Impression / Assessment and Plan / UC Course  I have reviewed the triage vital signs and the nursing notes.  Pertinent labs & imaging results that were available during my care of the patient were reviewed by me and considered in my medical decision making (see chart for details).   33 year old female pregnant dense with acute recurrent maxillary sinusitis.  Improved with the Augmentin but has since returned and states she is miserable.  She also has a cough that is keeping her up at nighttime.  I have told her that we give her dextromethorphan cough preparation which states in [employer] that it is safe in pregnancy.  Also Rhinocort nasal steroid which also is listed as safe in pregnancy.  I told her I do not like to treating her with another antibiotic but she is miserable and states that she would like to have an antibiotic since it helped before.  We will place her on azithromycin.  Asked her to clear all these medications with her OB before taking them.   Final Clinical Impressions(s) / UC Diagnoses   Final diagnoses:  Acute recurrent maxillary sinusitis     Discharge Instructions     Be sure to clear with your OB/GYN all medications prescribed today before taking any the medications.    ED Prescriptions    Medication Sig Dispense Auth. Provider   azithromycin (ZITHROMAX) 250 MG tablet Take 1 tablet (250 mg total) by mouth daily. Take first 2 tablets together, then 1 every day until finished. 6 tablet Ovid Curd P, PA-C   budesonide  (RHINOCORT AQUA) 32 MCG/ACT nasal spray Place 2 sprays into both nostrils daily. 5 mL Lutricia Feil, PA-C   dextromethorphan 15 MG/5ML syrup Take 10 mLs (30 mg total) by mouth 4 (four) times daily as needed for cough. 120 mL Lutricia Feil, PA-C     Controlled Substance Prescriptions [place] Controlled Substance Registry consulted? Not Applicable   Lutricia Feil, PA-C 04/03/18 1038

## 2018-04-03 NOTE — Discharge Instructions (Addendum)
Be sure to clear with your OB/GYN all medications prescribed today before taking any the medications.

## 2018-04-05 LAB — CULTURE, GROUP A STREP (THRC)

## 2018-04-23 ENCOUNTER — Ambulatory Visit (INDEPENDENT_AMBULATORY_CARE_PROVIDER_SITE_OTHER): Payer: BLUE CROSS/BLUE SHIELD | Admitting: Obstetrics & Gynecology

## 2018-04-23 ENCOUNTER — Other Ambulatory Visit: Payer: BLUE CROSS/BLUE SHIELD

## 2018-04-23 VITALS — BP 128/80 | Wt 174.0 lb

## 2018-04-23 DIAGNOSIS — Z131 Encounter for screening for diabetes mellitus: Secondary | ICD-10-CM

## 2018-04-23 DIAGNOSIS — O10912 Unspecified pre-existing hypertension complicating pregnancy, second trimester: Secondary | ICD-10-CM

## 2018-04-23 DIAGNOSIS — O10919 Unspecified pre-existing hypertension complicating pregnancy, unspecified trimester: Secondary | ICD-10-CM

## 2018-04-23 DIAGNOSIS — Z3A27 27 weeks gestation of pregnancy: Secondary | ICD-10-CM

## 2018-04-23 DIAGNOSIS — Z3482 Encounter for supervision of other normal pregnancy, second trimester: Secondary | ICD-10-CM

## 2018-04-23 LAB — POCT URINALYSIS DIPSTICK OB: GLUCOSE, UA: NEGATIVE

## 2018-04-23 NOTE — Progress Notes (Signed)
  Subjective  Fetal Movement? yes Contractions? no Leaking Fluid? no Vaginal Bleeding? no Denies ha, blurry vision, CP, SOB, epigastric pain  Objective  BP 128/80   Wt 174 lb (78.9 kg)   LMP 10/01/2017   BMI 28.08 kg/m  General: NAD Pumonary: no increased work of breathing Abdomen: gravid, non-tender Extremities: no edema Psychiatric: mood appropriate, affect full  Assessment  33 y.o. [id number] at [redacted]w[redacted]d by  07/21/2018, by Ultrasound presenting for routine prenatal visit  Plan   Problem List Items Addressed This Visit      Cardiovascular and Mediastinum   Pre-existing hypertension during pregnancy, antepartum    Other Visit Diagnoses    [redacted] weeks gestation of pregnancy    -  Primary   Screening for diabetes mellitus       Encounter for supervision of other normal pregnancy in second trimester        Labs today Plans vasec No s/sx worsening HTN, preeclampsia. Cont Labetalol Plan [place] growth 32 weeks  Annamarie Major, MD, [facility], [facility] 04/23/2018  9:54 AM

## 2018-04-24 LAB — 28 WEEK RH+PANEL
Basophils Absolute: 0 10*3/uL (ref 0.0–0.2)
Basos: 0 %
EOS (ABSOLUTE): 0 10*3/uL (ref 0.0–0.4)
Eos: 1 %
Gestational Diabetes Screen: 167 mg/dL — ABNORMAL HIGH (ref 65–139)
HIV Screen 4th Generation wRfx: NONREACTIVE
Hematocrit: 30.5 % — ABNORMAL LOW (ref 34.0–46.6)
Hemoglobin: 10.2 g/dL — ABNORMAL LOW (ref 11.1–15.9)
Immature Grans (Abs): 0.1 10*3/uL (ref 0.0–0.1)
Immature Granulocytes: 1 %
LYMPHS: 15 %
Lymphocytes Absolute: 1 10*3/uL (ref 0.7–3.1)
MCH: 28.6 pg (ref 26.6–33.0)
MCHC: 33.4 g/dL (ref 31.5–35.7)
MCV: 85 fL (ref 79–97)
Monocytes Absolute: 0.2 10*3/uL (ref 0.1–0.9)
Monocytes: 4 %
NEUTROS ABS: 5.5 10*3/uL (ref 1.4–7.0)
Neutrophils: 79 %
Platelets: 198 10*3/uL (ref 150–450)
RBC: 3.57 x10E6/uL — ABNORMAL LOW (ref 3.77–5.28)
RDW: 12.7 % (ref 11.7–15.4)
RPR Ser Ql: NONREACTIVE
WBC: 6.9 10*3/uL (ref 3.4–10.8)

## 2018-04-24 NOTE — Progress Notes (Signed)
Sch 3 hour GTT soon please

## 2018-04-26 ENCOUNTER — Other Ambulatory Visit: Payer: Self-pay | Admitting: Obstetrics & Gynecology

## 2018-04-26 DIAGNOSIS — O10919 Unspecified pre-existing hypertension complicating pregnancy, unspecified trimester: Secondary | ICD-10-CM

## 2018-04-30 ENCOUNTER — Other Ambulatory Visit: Payer: BLUE CROSS/BLUE SHIELD

## 2018-04-30 DIAGNOSIS — R7309 Other abnormal glucose: Secondary | ICD-10-CM

## 2018-05-01 LAB — GESTATIONAL GLUCOSE TOLERANCE
Glucose, Fasting: 76 mg/dL (ref 65–94)
Glucose, GTT - 1 Hour: 178 mg/dL (ref 65–179)
Glucose, GTT - 2 Hour: 144 mg/dL (ref 65–154)
Glucose, GTT - 3 Hour: 63 mg/dL — ABNORMAL LOW (ref 65–139)

## 2018-05-14 ENCOUNTER — Ambulatory Visit (INDEPENDENT_AMBULATORY_CARE_PROVIDER_SITE_OTHER): Payer: BLUE CROSS/BLUE SHIELD | Admitting: Obstetrics & Gynecology

## 2018-05-14 VITALS — BP 120/80 | Wt 175.0 lb

## 2018-05-14 DIAGNOSIS — Z23 Encounter for immunization: Secondary | ICD-10-CM | POA: Diagnosis not present

## 2018-05-14 DIAGNOSIS — Z3483 Encounter for supervision of other normal pregnancy, third trimester: Secondary | ICD-10-CM

## 2018-05-14 DIAGNOSIS — O10919 Unspecified pre-existing hypertension complicating pregnancy, unspecified trimester: Secondary | ICD-10-CM

## 2018-05-14 DIAGNOSIS — O10913 Unspecified pre-existing hypertension complicating pregnancy, third trimester: Secondary | ICD-10-CM

## 2018-05-14 DIAGNOSIS — Z3490 Encounter for supervision of normal pregnancy, unspecified, unspecified trimester: Secondary | ICD-10-CM | POA: Insufficient documentation

## 2018-05-14 DIAGNOSIS — Z3A30 30 weeks gestation of pregnancy: Secondary | ICD-10-CM

## 2018-05-14 LAB — POCT URINALYSIS DIPSTICK OB
Glucose, UA: NEGATIVE
POC,PROTEIN,UA: NEGATIVE

## 2018-05-14 MED ORDER — TETANUS-DIPHTH-ACELL PERTUSSIS 5-2.5-18.5 LF-MCG/0.5 IM SUSP
0.5000 mL | Freq: Once | INTRAMUSCULAR | Status: AC
  Administered 2018-05-14: 0.5 mL via INTRAMUSCULAR

## 2018-05-14 NOTE — Progress Notes (Signed)
  Subjective  Fetal Movement? yes Contractions? no Leaking Fluid? no Vaginal Bleeding? no Denies s/sx HTN Objective  BP 120/80   Wt 175 lb (79.4 kg)   LMP 10/01/2017   BMI 28.25 kg/m  General: NAD Pumonary: no increased work of breathing Abdomen: gravid, non-tender Extremities: no edema Psychiatric: mood appropriate, affect full  Assessment  33 y.o. [id number] at [redacted]w[redacted]d by  07/21/2018, by Ultrasound presenting for routine prenatal visit  Plan    Pre-existing hypertension during pregnancy, antepartum   US OB Follow Up for growth 32 weeks Monitor BPs Cont Labetalol (lower dose range, no change in BPs or sx's)   Supervision of normal pregnancy        PNV, FMC, PTL precautions        Labs discussed, normal 3 hour GTT after glucola 167   [redacted] weeks gestation of pregnancy     Need for Tdap vaccination    - done    Annamarie Major, MD, [facility], [facility] 05/14/2018  10:01 AM

## 2018-05-14 NOTE — Patient Instructions (Signed)
Tdap Vaccine (Tetanus, Diphtheria and Pertussis): What You Need to Know  1. Why get vaccinated?  Tetanus, diphtheria and pertussis are very serious diseases. Tdap vaccine can protect us from these diseases. And, Tdap vaccine given to pregnant women can protect newborn babies against pertussis..  TETANUS (Lockjaw) is rare in the [place] today. It causes painful muscle tightening and stiffness, usually all over the body.  · It can lead to tightening of muscles in the head and neck so you can't open your mouth, swallow, or sometimes even breathe. Tetanus kills about 1 out of 10 people who are infected even after receiving the best medical care.  DIPHTHERIA is also rare in the [place] today. It can cause a thick coating to form in the back of the throat.  · It can lead to breathing problems, heart failure, paralysis, and death.  PERTUSSIS (Whooping Cough) causes severe coughing spells, which can cause difficulty breathing, vomiting and disturbed sleep.  · It can also lead to weight loss, incontinence, and rib fractures. Up to 2 in 100 adolescents and 5 in 100 adults with pertussis are hospitalized or have complications, which could include pneumonia or death.  These diseases are caused by bacteria. Diphtheria and pertussis are spread from person to person through secretions from coughing or sneezing. Tetanus enters the body through cuts, scratches, or wounds.  Before vaccines, as many as 200,000 cases of diphtheria, 200,000 cases of pertussis, and hundreds of cases of tetanus, were reported in the [place] each year. Since vaccination began, reports of cases for tetanus and diphtheria have dropped by about 99% and for pertussis by about 80%.  2. Tdap vaccine  Tdap vaccine can protect adolescents and adults from tetanus, diphtheria, and pertussis. One dose of Tdap is routinely given at age 11 or 12. People who did not get Tdap at that age should get it as soon as possible.  Tdap is especially important  for healthcare professionals and anyone having close contact with a baby younger than 12 months.  Pregnant women should get a dose of Tdap during every pregnancy, to protect the newborn from pertussis. Infants are most at risk for severe, life-threatening complications from pertussis.  Another vaccine, called Td, protects against tetanus and diphtheria, but not pertussis. A Td booster should be given every 10 years. Tdap may be given as one of these boosters if you have never gotten Tdap before. Tdap may also be given after a severe cut or burn to prevent tetanus infection.  Your doctor or the person giving you the vaccine can give you more information.  Tdap may safely be given at the same time as other vaccines.  3. Some people should not get this vaccine  · A person who has ever had a life-threatening allergic reaction after a previous dose of any diphtheria, tetanus or pertussis containing vaccine, OR has a severe allergy to any part of this vaccine, should not get Tdap vaccine. Tell the person giving the vaccine about any severe allergies.  · Anyone who had coma or long repeated seizures within 7 days after a childhood dose of DTP or DTaP, or a previous dose of Tdap, should not get Tdap, unless a cause other than the vaccine was found. They can still get Td.  · Talk to your doctor if you:  ? have seizures or another nervous system problem,  ? had severe pain or swelling after any vaccine containing diphtheria, tetanus or pertussis,  ? ever had a condition   called Guillain-Barré Syndrome (GBS),  ? aren't feeling well on the day the shot is scheduled.  4. Risks  With any medicine, including vaccines, there is a chance of side effects. These are usually mild and go away on their own. Serious reactions are also possible but are rare.  Most people who get Tdap vaccine do not have any problems with it.  Mild problems following Tdap  (Did not interfere with activities)  · Pain where the shot was given (about 3 in 4  adolescents or 2 in 3 adults)  · Redness or swelling where the shot was given (about 1 person in 5)  · Mild fever of at least 100.4°F (up to about 1 in 25 adolescents or 1 in 100 adults)  · Headache (about 3 or 4 people in 10)  · Tiredness (about 1 person in 3 or 4)  · Nausea, vomiting, diarrhea, stomach ache (up to 1 in 4 adolescents or 1 in 10 adults)  · Chills, sore joints (about 1 person in 10)  · Body aches (about 1 person in 3 or 4)  · Rash, swollen glands (uncommon)  Moderate problems following Tdap  (Interfered with activities, but did not require medical attention)  · Pain where the shot was given (up to 1 in 5 or 6)  · Redness or swelling where the shot was given (up to about 1 in 16 adolescents or 1 in 12 adults)  · Fever over 102°F (about 1 in 100 adolescents or 1 in 250 adults)  · Headache (about 1 in 7 adolescents or 1 in 10 adults)  · Nausea, vomiting, diarrhea, stomach ache (up to 1 or 3 people in 100)  · Swelling of the entire arm where the shot was given (up to about 1 in 500).  Severe problems following Tdap  (Unable to perform usual activities; required medical attention)  · Swelling, severe pain, bleeding and redness in the arm where the shot was given (rare).  Problems that could happen after any vaccine:  · People sometimes faint after a medical procedure, including vaccination. Sitting or lying down for about 15 minutes can help prevent fainting, and injuries caused by a fall. Tell your doctor if you feel dizzy, or have vision changes or ringing in the ears.  · Some people get severe pain in the shoulder and have difficulty moving the arm where a shot was given. This happens very rarely.  · Any medication can cause a severe allergic reaction. Such reactions from a vaccine are very rare, estimated at fewer than 1 in a million doses, and would happen within a few minutes to a few hours after the vaccination.  As with any medicine, there is a very remote chance of a vaccine causing a serious  injury or death.  The safety of vaccines is always being monitored. For more information, visit: www.cdc.gov/vaccinesafety/  5. What if there is a serious problem?  What should I look for?  · Look for anything that concerns you, such as signs of a severe allergic reaction, very high fever, or unusual behavior.  Signs of a severe allergic reaction can include hives, swelling of the face and throat, difficulty breathing, a fast heartbeat, dizziness, and weakness. These would usually start a few minutes to a few hours after the vaccination.  What should I do?  · If you think it is a severe allergic reaction or other emergency that can't wait, call 9-1-1 or get the person to the nearest hospital. Otherwise,   call your doctor.  · Afterward, the reaction should be reported to the Vaccine Adverse Event Reporting System (VAERS). Your doctor might file this report, or you can do it yourself through the VAERS web site at www.vaers.hhs.gov, or by calling 1-800-822-7967.  VAERS does not give medical advice.  6. The National Vaccine Injury Compensation Program  The National Vaccine Injury Compensation Program (VICP) is a federal program that was created to compensate people who may have been injured by certain vaccines.  Persons who believe they may have been injured by a vaccine can learn about the program and about filing a claim by calling 1-800-338-2382 or visiting the VICP website at www.hrsa.gov/vaccinecompensation. There is a time limit to file a claim for compensation.  7. How can I learn more?  · Ask your doctor. He or she can give you the vaccine package insert or suggest other sources of information.  · Call your local or state health department.  · Contact the Centers for Disease Control and Prevention (CDC):  ? Call 1-800-232-4636 (1-800-CDC-INFO) or  ? Visit CDC's website at www.cdc.gov/vaccines  Vaccine Information Statement Tdap Vaccine (05/21/2013)  This information is not intended to replace advice given to you  by your health care provider. Make sure you discuss any questions you have with your health care provider.  Document Released: 09/13/2011 Document Revised: 10/30/2017 Document Reviewed: 10/30/2017  Elsevier Interactive Patient Education © 2019 Elsevier Inc.

## 2018-05-28 ENCOUNTER — Ambulatory Visit (INDEPENDENT_AMBULATORY_CARE_PROVIDER_SITE_OTHER): Payer: BLUE CROSS/BLUE SHIELD

## 2018-05-28 ENCOUNTER — Ambulatory Visit (INDEPENDENT_AMBULATORY_CARE_PROVIDER_SITE_OTHER): Payer: BLUE CROSS/BLUE SHIELD | Admitting: Obstetrics and Gynecology

## 2018-05-28 ENCOUNTER — Encounter: Payer: Self-pay | Admitting: Obstetrics and Gynecology

## 2018-05-28 VITALS — BP 128/88 | Wt 180.0 lb

## 2018-05-28 DIAGNOSIS — O10913 Unspecified pre-existing hypertension complicating pregnancy, third trimester: Secondary | ICD-10-CM | POA: Diagnosis not present

## 2018-05-28 DIAGNOSIS — Z3A32 32 weeks gestation of pregnancy: Secondary | ICD-10-CM

## 2018-05-28 DIAGNOSIS — Z3483 Encounter for supervision of other normal pregnancy, third trimester: Secondary | ICD-10-CM

## 2018-05-28 DIAGNOSIS — O10919 Unspecified pre-existing hypertension complicating pregnancy, unspecified trimester: Secondary | ICD-10-CM

## 2018-05-28 LAB — POCT URINALYSIS DIPSTICK OB: Glucose, UA: NEGATIVE

## 2018-05-28 MED ORDER — LABETALOL HCL 100 MG PO TABS: 200.0000 mg | ORAL_TABLET | Freq: Two times a day (BID) | ORAL | 0 refills | Status: DC

## 2018-05-28 NOTE — Progress Notes (Signed)
Routine Prenatal Care Visit  Subjective  Angela Weaver is a 33 y.o. [phone] at [redacted]w[redacted]d being seen today for ongoing prenatal care.  She is currently monitored for the following issues for this high-risk pregnancy and has Missed abortion; Supervision of low-risk pregnancy, first trimester; Pre-existing hypertension during pregnancy, antepartum; and Supervision of normal pregnancy on their problem list.  ----------------------------------------------------------------------------------- Patient reports high BP last night "158/92." She also had two days of headaches unrelieved by Tylenol with mildly elevated BPs associated. She associates a headache whenever her BP is elevated. She states it appears to be increasing.  Contractions: Not present. Vag. Bleeding: None.  Movement: Present. Denies leaking of fluid.  ----------------------------------------------------------------------------------- The following portions of the patient's history were reviewed and updated as appropriate: allergies, current medications, past family history, past medical history, past social history, past surgical history and problem list. Problem list updated.   Objective  Blood pressure 128/88, weight 180 lb (81.6 kg), last menstrual period 10/01/2017, currently breastfeeding. Pregravid weight 157 lb (71.2 kg) Total Weight Gain 23 lb (10.4 kg) Urinalysis: Urine Protein Trace  Urine Glucose Negative  Fetal Status: Fetal Heart Rate (bpm): 140   Movement: Present  Presentation: Vertex  General:  Alert, oriented and cooperative. Patient is in no acute distress.  Skin: Skin is warm and dry. No rash noted.   Cardiovascular: Normal heart rate noted  Respiratory: Normal respiratory effort, no problems with respiration noted  Abdomen: Soft, gravid, appropriate for gestational age. Pain/Pressure: Absent     Pelvic:  Cervical exam deferred        Extremities: Normal range of motion.  Edema: None  Mental Status: Normal  mood and affect. Normal behavior. Normal judgment and thought content.   NST: Baseline FHR: 140 beats/min Variability: moderate Accelerations: present Decelerations: absent Tocometry: not done  Interpretation:  INDICATIONS: chronic hypertension RESULTS:  A NST procedure was performed with FHR monitoring and a normal baseline established, appropriate time of 20-40 minutes of evaluation, and accels >2 seen w 15x15 characteristics.  Results show a REACTIVE NST.    Imaging Results US Ob Follow Up  Result Date: 05/28/2018 Patient Name: Angela Weaver DOB: 05/20/1985 MRN: [id number] ULTRASOUND REPORT Location: [facility] Date of Service: 05/28/2018 Indications:growth/afi Findings: Mason Jim intrauterine pregnancy is visualized with FHR at 159 BPM. Biometrics give an (U/S) Gestational age of [redacted]w[redacted]d and an (U/S) EDD of 07/17/2018; this correlates with the clinically established Estimated Date of Delivery: 07/21/18. Fetal presentation is Cephalic. Placenta: anterior. Grade: 1 AFI: 9.7 cm Growth percentile is 60.1. EFW: 2,137g (4lbs 11oz) Impression: 1. [redacted]w[redacted]d Viable Singleton Intrauterine pregnancy previously established criteria. 2. Growth is 60.1 %ile.  AFI is 9.7 cm. Darlina Guys, RDMS RVT The ultrasound images and findings were reviewed by me and I agree with the above report. Thomasene Mohair, MD, [facility], [facility] 05/28/2018 10:26 AM       Assessment   32 y.o. [id number] at [redacted]w[redacted]d by  07/21/2018, by Ultrasound presenting for routine prenatal visit  Plan   pregnancy7 Problems (from 10/01/17 to present)    Problem Noted Resolved   Supervision of normal pregnancy 05/14/2018 by Nadara Mustard, MD No   Pre-existing hypertension during pregnancy, antepartum 01/26/2018 by Nadara Mustard, MD No   Overview Signed 05/28/2018 10:26 AM by Conard Novak, MD    Uncontrolled CHT: on labetalol [ ]  NST 2x/week with weekly AFI at 32-34 weeks (per [email or web address]). []  growth  at 32w [60th%ile], 36w []   Supervision of low-risk pregnancy, first trimester 12/29/2017 by Nadara Mustard, MD No   Overview Signed 12/29/2017 10:14 AM by Nadara Mustard, MD    [facility] Prenatal Labs  Dating [place], [facility] changed 07/21/18 Blood type: A/Positive/-- (09/04 1414)   Genetic Screen 1 Screen:         NIPS: Antibody:Negative (09/04 1414)  Anatomic [place]  Rubella: 1.87 (09/04 1414) Varicella: @VZVIGG @  GTT            Third trimester:  RPR: Non Reactive (09/04 1414)   Rhogam A+, na HBsAg: Negative (09/04 1414)   TDaP vaccine                       Flu Shot: HIV: Non Reactive (09/04 1414)   Baby Food                                GBS:   Contraception  Pap:  CBB     CS/VBAC    Support Person             Preterm labor symptoms and general obstetric precautions including but not limited to vaginal bleeding, contractions, leaking of fluid and fetal movement were reviewed in detail with the patient. Please refer to After Visit Summary for other counseling recommendations.   Increase BP medication to labetalol 200 mg po BID.  Return in about 1 week (around 06/04/2018) for U/S for AFI with routine prenatal/NST.  Thomasene Mohair, MD, [facility], [facility] 05/28/2018 10:51 AM

## 2018-05-29 ENCOUNTER — Ambulatory Visit: Payer: BLUE CROSS/BLUE SHIELD

## 2018-05-29 ENCOUNTER — Encounter: Payer: BLUE CROSS/BLUE SHIELD | Admitting: Obstetrics & Gynecology

## 2018-06-07 ENCOUNTER — Ambulatory Visit (INDEPENDENT_AMBULATORY_CARE_PROVIDER_SITE_OTHER): Payer: BLUE CROSS/BLUE SHIELD

## 2018-06-07 ENCOUNTER — Other Ambulatory Visit: Payer: Self-pay

## 2018-06-07 ENCOUNTER — Ambulatory Visit (INDEPENDENT_AMBULATORY_CARE_PROVIDER_SITE_OTHER): Payer: BLUE CROSS/BLUE SHIELD | Admitting: Obstetrics & Gynecology

## 2018-06-07 ENCOUNTER — Encounter: Payer: Self-pay | Admitting: Obstetrics & Gynecology

## 2018-06-07 VITALS — BP 140/80 | Wt 180.0 lb

## 2018-06-07 DIAGNOSIS — Z3A33 33 weeks gestation of pregnancy: Secondary | ICD-10-CM | POA: Diagnosis not present

## 2018-06-07 DIAGNOSIS — Z3491 Encounter for supervision of normal pregnancy, unspecified, first trimester: Secondary | ICD-10-CM

## 2018-06-07 DIAGNOSIS — Z3483 Encounter for supervision of other normal pregnancy, third trimester: Secondary | ICD-10-CM

## 2018-06-07 DIAGNOSIS — O10913 Unspecified pre-existing hypertension complicating pregnancy, third trimester: Secondary | ICD-10-CM | POA: Diagnosis not present

## 2018-06-07 DIAGNOSIS — Z3689 Encounter for other specified antenatal screening: Secondary | ICD-10-CM

## 2018-06-07 DIAGNOSIS — O10919 Unspecified pre-existing hypertension complicating pregnancy, unspecified trimester: Secondary | ICD-10-CM

## 2018-06-07 NOTE — Progress Notes (Signed)
  Subjective  Fetal Movement? yes Contractions? no Leaking Fluid? no Vaginal Bleeding? no Occas headache; no CP SOB blurry vision epigastric pain edema Objective  BP 140/80   Wt 180 lb (81.6 kg)   LMP 10/01/2017   BMI 29.05 kg/m  General: NAD Pumonary: no increased work of breathing Abdomen: gravid, non-tender Extremities: no edema Psychiatric: mood appropriate, affect full  Assessment  33 y.o. [id number] at [redacted]w[redacted]d by  07/21/2018, by Ultrasound presenting for routine prenatal visit  Plan   Problem List Items Addressed This Visit      Cardiovascular and Mediastinum   Pre-existing hypertension during pregnancy, antepartum     Other   Supervision of normal pregnancy    Other Visit Diagnoses    [redacted] weeks gestation of pregnancy    -  Primary    A NST procedure was performed with FHR monitoring and a normal baseline established, appropriate time of 20-40 minutes of evaluation, and accels >2 seen w 15x15 characteristics.  Results show a REACTIVE NST.   Review of ULTRASOUND.    I have personally reviewed images and report of recent ultrasound done at [facility].    Plan of management to be discussed with patient.    AFI 11, Vtx  [facility] Prenatal Labs  Dating [place], [facility] changed 07/21/18 Blood type: A/Positive/-- (09/04 1414)   Genetic Screen 1 Screen:   neg Antibody:Negative (09/04 1414)  Anatomic [place] [facility] Rubella: 1.87 (09/04 1414) Varicella: NI  GTT    164, 3 hour GTT  Normal RPR: Non Reactive (09/04 1414)   Rhogam A+, na HBsAg: Negative (09/04 1414)   TDaP vaccine    05/14/18  Flu Shot:10/19 HIV: Non Reactive (09/04 1414)   Baby Food           Breast                     GBS: pending 36 weeks  Contraception           Vasec Pap: needs PP   Cont to monitor BP's as have been high at times    Labetalol 200 mg BID    NST twice weekly, AFI weekly    Growth [place] 36 weeks  Annamarie Major, MD, [facility], [facility] 06/07/2018  11:20 AM

## 2018-06-08 ENCOUNTER — Other Ambulatory Visit: Payer: Self-pay | Admitting: Obstetrics & Gynecology

## 2018-06-08 DIAGNOSIS — O10919 Unspecified pre-existing hypertension complicating pregnancy, unspecified trimester: Secondary | ICD-10-CM

## 2018-06-10 ENCOUNTER — Encounter: Payer: Self-pay | Admitting: Obstetrics & Gynecology

## 2018-06-11 ENCOUNTER — Other Ambulatory Visit: Payer: Self-pay | Admitting: Obstetrics & Gynecology

## 2018-06-11 ENCOUNTER — Observation Stay
Admission: RE | Admit: 2018-06-11 | Discharge: 2018-06-11 | Disposition: A | Discharge status: Home / Self Care | Payer: BLUE CROSS/BLUE SHIELD | Attending: Obstetrics & Gynecology | Admitting: Obstetrics & Gynecology

## 2018-06-11 ENCOUNTER — Other Ambulatory Visit: Payer: Self-pay

## 2018-06-11 ENCOUNTER — Ambulatory Visit (INDEPENDENT_AMBULATORY_CARE_PROVIDER_SITE_OTHER): Payer: BLUE CROSS/BLUE SHIELD | Admitting: Obstetrics & Gynecology

## 2018-06-11 VITALS — BP 158/98 | Wt 178.0 lb

## 2018-06-11 DIAGNOSIS — Z3483 Encounter for supervision of other normal pregnancy, third trimester: Secondary | ICD-10-CM

## 2018-06-11 DIAGNOSIS — Z3A32 32 weeks gestation of pregnancy: Secondary | ICD-10-CM | POA: Diagnosis not present

## 2018-06-11 DIAGNOSIS — O10919 Unspecified pre-existing hypertension complicating pregnancy, unspecified trimester: Secondary | ICD-10-CM

## 2018-06-11 DIAGNOSIS — O10913 Unspecified pre-existing hypertension complicating pregnancy, third trimester: Secondary | ICD-10-CM

## 2018-06-11 DIAGNOSIS — O163 Unspecified maternal hypertension, third trimester: Principal | ICD-10-CM | POA: Insufficient documentation

## 2018-06-11 DIAGNOSIS — O169 Unspecified maternal hypertension, unspecified trimester: Secondary | ICD-10-CM | POA: Diagnosis present

## 2018-06-11 DIAGNOSIS — Z3491 Encounter for supervision of normal pregnancy, unspecified, first trimester: Secondary | ICD-10-CM

## 2018-06-11 DIAGNOSIS — Z3A34 34 weeks gestation of pregnancy: Secondary | ICD-10-CM

## 2018-06-11 LAB — COMPREHENSIVE METABOLIC PANEL
ALT: 9 U/L (ref 0–44)
AST: 16 U/L (ref 15–41)
Albumin: 3 g/dL — ABNORMAL LOW (ref 3.5–5.0)
Alkaline Phosphatase: 91 U/L (ref 38–126)
Anion gap: 8 (ref 5–15)
BUN: 6 mg/dL (ref 6–20)
CO2: 22 mmol/L (ref 22–32)
Calcium: 8.3 mg/dL — ABNORMAL LOW (ref 8.9–10.3)
Chloride: 107 mmol/L (ref 98–111)
Creatinine, Ser: 0.52 mg/dL (ref 0.44–1.00)
GFR calc Af Amer: >60 mL/min (ref >60)
GFR calc non Af Amer: >60 mL/min (ref >60)
Glucose, Bld: 86 mg/dL (ref 70–99)
Potassium: 3.6 mmol/L (ref 3.5–5.1)
Sodium: 137 mmol/L (ref 135–145)
Total Bilirubin: 0.7 mg/dL (ref 0.3–1.2)
Total Protein: 6 g/dL — ABNORMAL LOW (ref 6.5–8.1)

## 2018-06-11 LAB — CBC
HCT: 30.5 % — ABNORMAL LOW (ref 36.0–46.0)
Hemoglobin: 10.3 g/dL — ABNORMAL LOW (ref 12.0–15.0)
MCH: 29.1 pg (ref 26.0–34.0)
MCHC: 33.8 g/dL (ref 30.0–36.0)
MCV: 86.2 fL (ref 80.0–100.0)
Platelets: 140 10*3/uL — ABNORMAL LOW (ref 150–400)
RBC: 3.54 MIL/uL — ABNORMAL LOW (ref 3.87–5.11)
RDW: 13.5 % (ref 11.5–15.5)
WBC: 5.6 10*3/uL (ref 4.0–10.5)
nRBC: 0 % (ref 0.0–0.2)

## 2018-06-11 LAB — POCT URINALYSIS DIPSTICK OB: Glucose, UA: NEGATIVE

## 2018-06-11 LAB — PROTEIN / CREATININE RATIO, URINE
Creatinine, Urine: 88 mg/dL
Protein Creatinine Ratio: 0.24 mg/mg{Cre} — ABNORMAL HIGH (ref 0.00–0.15)
Total Protein, Urine: 21 mg/dL

## 2018-06-11 MED ORDER — LABETALOL HCL 5 MG/ML IV SOLN: 40.0000 mg | INTRAVENOUS | Status: DC | PRN

## 2018-06-11 MED ORDER — NIFEDIPINE 10 MG PO CAPS: 10.0000 mg | ORAL_CAPSULE | ORAL | Status: DC | PRN

## 2018-06-11 MED ORDER — NIFEDIPINE 10 MG PO CAPS: 20.0000 mg | ORAL_CAPSULE | ORAL | Status: DC | PRN

## 2018-06-11 MED ORDER — LIDOCAINE HCL (PF) 1 % IJ SOLN: 30.0000 mL | INTRAMUSCULAR | Status: DC | PRN

## 2018-06-11 MED ORDER — LABETALOL HCL 100 MG PO TABS
100.0000 mg | ORAL_TABLET | Freq: Once | ORAL | Status: DC
  Filled 2018-06-11: qty 1

## 2018-06-11 MED ORDER — LABETALOL HCL 100 MG PO TABS
200.0000 mg | ORAL_TABLET | Freq: Once | ORAL | Status: AC
  Administered 2018-06-11: 200 mg via ORAL
  Filled 2018-06-11: qty 2

## 2018-06-11 NOTE — OB Triage Note (Signed)
Patient sent over from [facility] office for [facility] evaluation due to elevated blood pressures in office, headache and RUQ pain.

## 2018-06-11 NOTE — Progress Notes (Signed)
Obstetrics & Gynecology Office Visit    Chief Complaint  Patient presents with  . Headache  . Heartburn    History of Present Illness: 33 y.o. F1Q1975 being seen for follow up blood pressure check today.  The patient is currently pregnantThe established diagnosis for the patient is chronic hypertension.  She is currently on labetalol 200mg .  She reports for the last 24 hours epigastric pain and headaches.  Medication list reviewed medications which may contribute to BP elevation were not noted and no medications contraindicated for use in patient with current hypertension were noted.  Past Medical History:  Past Medical History:  Diagnosis Date  . Anemia   . Heart murmur    SAW CARDIOLOGIST IN 2011 AND EVERYTHING WAS FINE PER PT-  ASYPMTOMATIC  . Hypertension   . Migraine    migraines  . Vaginal Pap smear, abnormal    LGSIL    Past Surgical History:  Past Surgical History:  Procedure Laterality Date  . DILATION AND EVACUATION  04/27/2017   Procedure: DILATATION AND EVACUATION;  Surgeon: Nadara Mustard, MD;  Location: [facility] ORS;  Service: Gynecology;;  . KNEE SURGERY    . TONSILLECTOMY    . WISDOM TOOTH EXTRACTION      Gynecologic History: Patient's last menstrual period was 10/01/2017.  Obstetric History: [id number]  Family History:  Family History  Problem Relation Age of Onset  . Melanoma Maternal Aunt 50  . Melanoma Maternal Grandmother 72  . Diabetes Maternal Grandfather     Social History:  Social History   Socioeconomic History  . Marital status: Married    Spouse name: Not on file  . Number of children: Not on file  . Years of education: Not on file  . Highest education level: Not on file  Occupational History  . Not on file  Social Needs  . Financial resource strain: Not on file  . Food insecurity:    Worry: Not on file    Inability: Not on file  . Transportation needs:    Medical: Not on file    Non-medical: Not on file  Tobacco Use  .  Smoking status: Former Smoker    Packs/day: 0.25    Years: 2.00    Pack years: 0.50    Types: Cigarettes    Last attempt to quit: 04/26/2011    Years since quitting: 7.1  . Smokeless tobacco: Never Used  . Tobacco comment: more socially   Substance and Sexual Activity  . Alcohol use: No  . Drug use: No  . Sexual activity: Yes    Birth control/protection: None  Lifestyle  . Physical activity:    Days per week: Not on file    Minutes per session: Not on file  . Stress: Not on file  Relationships  . Social connections:    Talks on phone: Not on file    Gets together: Not on file    Attends religious service: Not on file    Active member of club or organization: Not on file    Attends meetings of clubs or organizations: Not on file    Relationship status: Not on file  . Intimate partner violence:    Fear of current or ex partner: Not on file    Emotionally abused: Not on file    Physically abused: Not on file    Forced sexual activity: Not on file  Other Topics Concern  . Not on file  Social History Narrative  .  Not on file    Allergies:  Allergies  Allergen Reactions  . Coconut Flavor Hives and Itching  . Sulfa Antibiotics Hives and Other (See Comments)    Told by mother    . Coconut Oil Rash  . Codeine Rash and Other (See Comments)    Been told by mother    . Codone [Hydrocodone] Anxiety    Medications: Prior to Admission medications   Medication Sig Start Date End Date Taking? Authorizing Provider  acetaminophen (TYLENOL) 500 MG tablet Take 500 mg by mouth every 6 (six) hours as needed (for pain.).    [provider]  labetalol (NORMODYNE) 100 MG tablet Take 2 tablets (200 mg total) by mouth 2 (two) times daily. 05/28/18   Conard Novak, MD  prenatal vitamin w/FE, FA (PRENATAL 1 + 1) 27-1 MG TABS tablet Take 1 tablet by mouth every evening.     [provider]    Review of Systems  All other systems reviewed and are negative.    Physical Exam Blood pressure (!) 158/98, weight 178 lb (80.7 kg), last menstrual period 10/01/2017, currently breastfeeding.  Patient's last menstrual period was 10/01/2017.  General: NAD HEENT: normocephalic, anicteric Pulmonary: No increased work of breathing Cardiovascular: RRR, distal pulses 2+ Extremities: noedema, no erythema, no tenderness Neurologic: Grossly intact Psychiatric: mood appropriate, affect full FHT 160s  Assessment: 33 y.o. [id number] presenting for blood pressure evaluation today; assess for superimposed preeclampsia  Plan: Problem List Items Addressed This Visit      Cardiovascular and Mediastinum   Pre-existing hypertension during pregnancy, antepartum    Other Visit Diagnoses    [redacted] weeks gestation of pregnancy    -  Primary   Relevant Orders   POC Urinalysis Dipstick OB (Completed)      Blood pressure - blood pressure at today's visit is significantly elevated.  As a result the patient was sent to the L&D  for further evaluation. - additional blood work at L&D to be done now  - NST to be done there - serial BP monitoring  Annamarie Major, MD, [facility], [facility] 06/11/2018  4:53 PM

## 2018-06-11 NOTE — Telephone Encounter (Signed)
Schedule work in today

## 2018-06-11 NOTE — Discharge Instructions (Signed)
Please call or return to the Birthplace at [facility] with any of the following concerns: Contractions every 3-5 mins for an hour Vaginal bleeding Decreased fetal movement Leaking of fluid Headache, abdominal pain, dizziness, blurred vision

## 2018-06-12 ENCOUNTER — Ambulatory Visit (INDEPENDENT_AMBULATORY_CARE_PROVIDER_SITE_OTHER): Payer: BLUE CROSS/BLUE SHIELD | Admitting: Obstetrics & Gynecology

## 2018-06-12 ENCOUNTER — Encounter: Payer: Self-pay | Admitting: Obstetrics & Gynecology

## 2018-06-12 VITALS — BP 132/80 | Wt 179.0 lb

## 2018-06-12 DIAGNOSIS — O10913 Unspecified pre-existing hypertension complicating pregnancy, third trimester: Secondary | ICD-10-CM | POA: Diagnosis not present

## 2018-06-12 DIAGNOSIS — Z3A34 34 weeks gestation of pregnancy: Secondary | ICD-10-CM

## 2018-06-12 DIAGNOSIS — O10919 Unspecified pre-existing hypertension complicating pregnancy, unspecified trimester: Secondary | ICD-10-CM

## 2018-06-12 DIAGNOSIS — O163 Unspecified maternal hypertension, third trimester: Secondary | ICD-10-CM

## 2018-06-12 DIAGNOSIS — Z3483 Encounter for supervision of other normal pregnancy, third trimester: Secondary | ICD-10-CM

## 2018-06-12 NOTE — Final Progress Note (Signed)
Physician Final Progress Note  Patient ID: Angela Weaver MRN: [id number] DOB/AGE: 1985/05/09 33 y.o.  Admit date: 06/11/2018 Admitting provider: Natale Milch, MD Discharge date: 06/12/2018  Admission Diagnoses: 32 weeks pregnancy; Hypertension in pregnancy  Discharge Diagnoses:  Active Problems:   Hypertension affecting pregnancy   Consults: None  Significant Findings/ Diagnostic Studies: labs: PIH labs normal and NST R, improved BPs w rest  Procedures: Pt seen and examined in office, then to traige for BP monitoring, labs, and NST.  No s/sx superimposed preeclampsia.  Pt will come out of work to allow for more rest and less stress.  She will continue Labetalol 200 mg BID.  Office appointment tomorrow.  Knows may need earlier delivery if worsening BP occurs.   A NST procedure was performed with FHR monitoring and a normal baseline established, appropriate time of 20-40 minutes of evaluation, and accels >2 seen w 15x15 characteristics.  Results show a REACTIVE NST.   Discharge Condition: good  Disposition: home  Diet: Regular diet  Discharge Activity: Activity as tolerated   Allergies as of 06/11/2018      Reactions   Coconut Flavor Hives, Itching   Sulfa Antibiotics Hives, Other (See Comments)   Told by mother    Coconut Oil Rash   Codeine Rash, Other (See Comments)   Been told by mother    Codone [hydrocodone] Anxiety      Medication List    ASK your doctor about these medications   acetaminophen 500 MG tablet Commonly known as:  TYLENOL Take 500 mg by mouth every 6 (six) hours as needed (for pain.).   labetalol 100 MG tablet Commonly known as:  NORMODYNE Take 2 tablets (200 mg total) by mouth 2 (two) times daily.   prenatal vitamin w/FE, FA 27-1 MG Tabs tablet Take 1 tablet by mouth every evening.        Total time spent taking care of this patient: TRAIGE  Signed: [name] 06/12/2018, 4:01 PM

## 2018-06-12 NOTE — Discharge Summary (Signed)
  See FPN 

## 2018-06-12 NOTE — Progress Notes (Signed)
  Subjective  Fetal Movement? yes Contractions? no Leaking Fluid? no Vaginal Bleeding? no Less headache and abd pains, no edema Pt is on modified bed rest at home, out of work since yesterday, and feels better Objective  BP 132/80   Wt 179 lb (81.2 kg)   LMP 10/01/2017   BMI 28.89 kg/m  General: NAD Pumonary: no increased work of breathing Abdomen: gravid, non-tender Extremities: no edema Psychiatric: mood appropriate, affect full NONSTRESS TEST INTERPRETATION  INDICATIONS: cHTN FHR baseline: 150 RESULTS:  A NST procedure was performed with FHR monitoring and a normal baseline established, appropriate time of 20-40 minutes of evaluation, and accels >2 seen w 15x15 characteristics.  Results show a REACTIVE NST.   Assessment  33 y.o. [id number] at [redacted]w[redacted]d by  07/21/2018, by Ultrasound presenting for routine prenatal visit  Plan   Problem List Items Addressed This Visit      Cardiovascular and Mediastinum   Pre-existing hypertension during pregnancy, antepartum   Hypertension affecting pregnancy     Other   Supervision of normal pregnancy    Other Visit Diagnoses    [redacted] weeks gestation of pregnancy    -  Primary     PLAN: 1. Continue fetal kick counts as directed. 2. Continue antepartum testing as scheduled. 3. Bed rest, modified 4. Labetalol 5. NST twice weekly, AFI weekly (due Thurs) 6. Preeclampsia superimposed on cHTN discussed as a risk, cont to monitor  Annamarie Major, MD, [facility], [facility] 06/12/2018  11:30 AM

## 2018-06-14 ENCOUNTER — Ambulatory Visit: Payer: BLUE CROSS/BLUE SHIELD

## 2018-06-14 ENCOUNTER — Other Ambulatory Visit: Payer: Self-pay | Admitting: Obstetrics & Gynecology

## 2018-06-14 ENCOUNTER — Other Ambulatory Visit: Payer: Self-pay

## 2018-06-14 ENCOUNTER — Ambulatory Visit
Admission: RE | Admit: 2018-06-14 | Discharge: 2018-06-14 | Disposition: A | Discharge status: Home / Self Care | Payer: BLUE CROSS/BLUE SHIELD | Source: Ambulatory Visit | Attending: Obstetrics & Gynecology | Admitting: Obstetrics & Gynecology

## 2018-06-14 ENCOUNTER — Encounter: Payer: Self-pay | Admitting: Obstetrics & Gynecology

## 2018-06-14 DIAGNOSIS — O10919 Unspecified pre-existing hypertension complicating pregnancy, unspecified trimester: Secondary | ICD-10-CM | POA: Diagnosis present

## 2018-06-14 MED ORDER — OSELTAMIVIR PHOSPHATE 75 MG PO CAPS: 75.0000 mg | ORAL_CAPSULE | Freq: Every day | ORAL | 0 refills | Status: DC

## 2018-06-15 ENCOUNTER — Other Ambulatory Visit: Payer: Self-pay

## 2018-06-15 ENCOUNTER — Ambulatory Visit (INDEPENDENT_AMBULATORY_CARE_PROVIDER_SITE_OTHER): Payer: BLUE CROSS/BLUE SHIELD | Admitting: Obstetrics and Gynecology

## 2018-06-15 VITALS — BP 144/90 | Wt 177.0 lb

## 2018-06-15 DIAGNOSIS — O10912 Unspecified pre-existing hypertension complicating pregnancy, second trimester: Secondary | ICD-10-CM | POA: Diagnosis not present

## 2018-06-15 DIAGNOSIS — Z3A34 34 weeks gestation of pregnancy: Secondary | ICD-10-CM | POA: Diagnosis not present

## 2018-06-15 DIAGNOSIS — O163 Unspecified maternal hypertension, third trimester: Secondary | ICD-10-CM

## 2018-06-15 DIAGNOSIS — O10919 Unspecified pre-existing hypertension complicating pregnancy, unspecified trimester: Secondary | ICD-10-CM

## 2018-06-15 DIAGNOSIS — Z3403 Encounter for supervision of normal first pregnancy, third trimester: Secondary | ICD-10-CM

## 2018-06-15 LAB — POCT URINALYSIS DIPSTICK OB: GLUCOSE, UA: NEGATIVE

## 2018-06-15 NOTE — Progress Notes (Signed)
Routine Prenatal Care Visit  Subjective  Angela Weaver is a 33 y.o. [phone] at [redacted]w[redacted]d being seen today for ongoing prenatal care.  She is currently monitored for the following issues for this high-risk pregnancy and has Missed abortion; Supervision of low-risk pregnancy, first trimester; Pre-existing hypertension during pregnancy, antepartum; Supervision of normal pregnancy; and Hypertension affecting pregnancy on their problem list.  ----------------------------------------------------------------------------------- Patient reports her son has the flu (Tested positive). SHe is taking prophylactic Tamiflu.  She has mild URI symptoms.  No fevers or chills.  She denies HA, visual changes, and RUQ pain.    Contractions: Not present. Vag. Bleeding: None.  Movement: Present. Denies leaking of fluid.  ----------------------------------------------------------------------------------- The following portions of the patient's history were reviewed and updated as appropriate: allergies, current medications, past family history, past medical history, past social history, past surgical history and problem list. Problem list updated.   Objective  Blood pressure (!) 144/90, weight 177 lb (80.3 kg), last menstrual period 10/01/2017, currently breastfeeding. Pregravid weight 157 lb (71.2 kg) Total Weight Gain 20 lb (9.072 kg) Urinalysis: Urine Protein Small (1+)  Urine Glucose Negative  Fetal Status: Fetal Heart Rate (bpm): 135   Movement: Present     General:  Alert, oriented and cooperative. Patient is in no acute distress.  Skin: Skin is warm and dry. No rash noted.   Cardiovascular: Normal heart rate noted  Respiratory: Normal respiratory effort, no problems with respiration noted  Abdomen: Soft, gravid, appropriate for gestational age. Pain/Pressure: Absent     Pelvic:  Cervical exam deferred        Extremities: Normal range of motion.     Mental Status: Normal mood and affect. Normal  behavior. Normal judgment and thought content.   NST: Baseline FHR: 135 beats/min Variability: moderate Accelerations: present Decelerations: absent Tocometry: not done  Interpretation:  INDICATIONS: chronic hypertension RESULTS:  A NST procedure was performed with FHR monitoring and a normal baseline established, appropriate time of 20-40 minutes of evaluation, and accels >2 seen w 15x15 characteristics.  Results show a REACTIVE NST.    Assessment   33 y.o. [id number] at [redacted]w[redacted]d by  07/21/2018, by Ultrasound presenting for routine prenatal visit  Plan   pregnancy7 Problems (from 10/01/17 to present)    Problem Noted Resolved   Supervision of normal pregnancy 05/14/2018 by Nadara Mustard, MD No   Pre-existing hypertension during pregnancy, antepartum 01/26/2018 by Nadara Mustard, MD No   Overview Signed 05/28/2018 10:26 AM by Conard Novak, MD    Uncontrolled CHT: on labetalol [ ]  NST 2x/week with weekly AFI at 32-34 weeks (per [email or web address]). []  growth at 32w [60th%ile], 36w []       Supervision of low-risk pregnancy, first trimester 12/29/2017 by Nadara Mustard, MD No   Overview Addendum 06/07/2018 11:26 AM by Nadara Mustard, MD    [facility] P[facility]atal Labs  Dating [place], [facility] changed 07/21/18 Blood type: A/Positive/-- (09/04 1414)   Genetic Screen 1 Screen:         NIPS: Antibody:Negative (09/04 1414)  Anatomic [place] [facility] Rubella: 1.87 (09/04 1414) Varicella: NI  GTT    164, 3 hour GTT  Normal RPR: Non Reactive (09/04 1414)   Rhogam A+, na HBsAg: Negative (09/04 1414)   TDaP vaccine    05/14/18  Flu Shot:10/19 HIV: Non Reactive (09/04 1414)   Baby Food           Breast  GBS:   Contraception           Vasec Pap: needs PP  CBB     CS/VBAC na   Support Person              Preterm labor symptoms and general obstetric precautions including but not limited to vaginal bleeding, contractions, leaking of fluid and fetal movement were reviewed in detail with  the patient. Please refer to After Visit Summary for other counseling recommendations.   - reassuring NST - Discussed hospital limitations on visitors and precautions regarding COVID. - keep previously schedule appointments by Dr. Tiburcio Pea  Return in about 3 days (around 06/18/2018) for Routine Prenatal Appointment/NST, U/S for AFI with ROB/NST 1 week.  Thomasene Mohair, MD, [facility], [facility] 06/15/2018 10:42 AM

## 2018-06-15 NOTE — Patient Instructions (Signed)
Coronavirus (COVID-19) Are you at risk?  Are you at risk for the Coronavirus (COVID-19)?  To be considered HIGH RISK for Coronavirus (COVID-19), you have to meet the following criteria:  . Traveled to [place], [place], [place], [place] or [place]; or in the [place] to [place], [place], [place], or [place]; and have fever, cough, and shortness of breath within the last 2 weeks of travel OR . Been in close contact with a person diagnosed with COVID-19 within the last 2 weeks and have fever, cough, and shortness of breath . IF YOU DO NOT MEET THESE CRITERIA, YOU ARE CONSIDERED LOW RISK FOR COVID-19.  What to do if you are HIGH RISK for COVID-19?  . If you are having a medical emergency, call 911. . Seek medical care right away. Before you go to a doctor's office, urgent care or emergency department, call ahead and tell them about your recent travel, contact with someone diagnosed with COVID-19, and your symptoms. You should receive instructions from your physician's office regarding next steps of care.  . When you arrive at healthcare provider, tell the healthcare staff immediately you have returned from visiting [place], [place], [place], [place] or [place]; or traveled in the [place] to [place], [place], [place], or [place]; in the last two weeks or you have been in close contact with a person diagnosed with COVID-19 in the last 2 weeks.   . Tell the health care staff about your symptoms: fever, cough and shortness of breath. . After you have been seen by a medical provider, you will be either: o Tested for (COVID-19) and discharged home on quarantine except to seek medical care if symptoms worsen, and asked to  - Stay home and avoid contact with others until you get your results (4-5 days)  - Avoid travel on public transportation if possible (such as bus, train, or airplane) or o Sent to the Emergency Department by EMS for evaluation, COVID-19 testing, and possible  admission depending on your condition and test results.  What to do if you are LOW RISK for COVID-19?  Reduce your risk of any infection by using the same precautions used for avoiding the common cold or flu:  . Wash your hands often with soap and warm water for at least 20 seconds.  If soap and water are not readily available, use an alcohol-based hand sanitizer with at least 60% alcohol.  . If coughing or sneezing, cover your mouth and nose by coughing or sneezing into the elbow areas of your shirt or coat, into a tissue or into your sleeve (not your hands). . Avoid shaking hands with others and consider head nods or verbal greetings only. . Avoid touching your eyes, nose, or mouth with unwashed hands.  . Avoid close contact with people who are sick. . Avoid places or events with large numbers of people in one location, like concerts or sporting events. . Carefully consider travel plans you have or are making. . If you are planning any travel outside or inside the [place], visit the CDC's Travelers' Health webpage for the latest health notices. . If you have some symptoms but not all symptoms, continue to monitor at home and seek medical attention if your symptoms worsen. . If you are having a medical emergency, call 911.   ADDITIONAL HEALTHCARE OPTIONS FOR PATIENTS  [facility] / e-Visit: [email or web address]         [facility]: [phone]  [facility]: [phone]                   [facility]: [phone]   

## 2018-06-19 ENCOUNTER — Encounter: Payer: Self-pay | Admitting: Obstetrics & Gynecology

## 2018-06-19 ENCOUNTER — Other Ambulatory Visit: Payer: Self-pay | Admitting: Obstetrics & Gynecology

## 2018-06-19 ENCOUNTER — Other Ambulatory Visit: Payer: Self-pay

## 2018-06-19 ENCOUNTER — Observation Stay
Admission: EM | Admit: 2018-06-19 | Discharge: 2018-06-19 | Disposition: A | Discharge status: Home / Self Care | Payer: BLUE CROSS/BLUE SHIELD | Attending: Obstetrics & Gynecology | Admitting: Obstetrics & Gynecology

## 2018-06-19 ENCOUNTER — Ambulatory Visit (INDEPENDENT_AMBULATORY_CARE_PROVIDER_SITE_OTHER): Payer: BLUE CROSS/BLUE SHIELD | Admitting: Obstetrics & Gynecology

## 2018-06-19 VITALS — BP 150/90 | Wt 178.0 lb

## 2018-06-19 DIAGNOSIS — O163 Unspecified maternal hypertension, third trimester: Secondary | ICD-10-CM | POA: Diagnosis present

## 2018-06-19 DIAGNOSIS — Z3483 Encounter for supervision of other normal pregnancy, third trimester: Secondary | ICD-10-CM

## 2018-06-19 DIAGNOSIS — Z3403 Encounter for supervision of normal first pregnancy, third trimester: Secondary | ICD-10-CM

## 2018-06-19 DIAGNOSIS — Z3A35 35 weeks gestation of pregnancy: Secondary | ICD-10-CM

## 2018-06-19 DIAGNOSIS — O169 Unspecified maternal hypertension, unspecified trimester: Secondary | ICD-10-CM | POA: Diagnosis present

## 2018-06-19 DIAGNOSIS — Z3491 Encounter for supervision of normal pregnancy, unspecified, first trimester: Secondary | ICD-10-CM

## 2018-06-19 DIAGNOSIS — O10913 Unspecified pre-existing hypertension complicating pregnancy, third trimester: Secondary | ICD-10-CM

## 2018-06-19 DIAGNOSIS — O10919 Unspecified pre-existing hypertension complicating pregnancy, unspecified trimester: Secondary | ICD-10-CM

## 2018-06-19 LAB — POCT URINALYSIS DIPSTICK OB
Glucose, UA: NEGATIVE
POC,PROTEIN,UA: NEGATIVE

## 2018-06-19 LAB — CBC
HCT: 30.6 % — ABNORMAL LOW (ref 36.0–46.0)
Hemoglobin: 10.3 g/dL — ABNORMAL LOW (ref 12.0–15.0)
MCH: 28.8 pg (ref 26.0–34.0)
MCHC: 33.7 g/dL (ref 30.0–36.0)
MCV: 85.5 fL (ref 80.0–100.0)
Platelets: 156 10*3/uL (ref 150–400)
RBC: 3.58 MIL/uL — ABNORMAL LOW (ref 3.87–5.11)
RDW: 13.2 % (ref 11.5–15.5)
WBC: 5.6 10*3/uL (ref 4.0–10.5)
nRBC: 0 % (ref 0.0–0.2)

## 2018-06-19 LAB — COMPREHENSIVE METABOLIC PANEL
ALT: 9 U/L (ref 0–44)
AST: 16 U/L (ref 15–41)
Albumin: 3 g/dL — ABNORMAL LOW (ref 3.5–5.0)
Alkaline Phosphatase: 98 U/L (ref 38–126)
Anion gap: 9 (ref 5–15)
BUN: 6 mg/dL (ref 6–20)
CO2: 22 mmol/L (ref 22–32)
Calcium: 8.7 mg/dL — ABNORMAL LOW (ref 8.9–10.3)
Chloride: 105 mmol/L (ref 98–111)
Creatinine, Ser: 0.56 mg/dL (ref 0.44–1.00)
GFR calc Af Amer: >60 mL/min (ref >60)
GFR calc non Af Amer: >60 mL/min (ref >60)
Glucose, Bld: 101 mg/dL — ABNORMAL HIGH (ref 70–99)
Potassium: 3.5 mmol/L (ref 3.5–5.1)
Sodium: 136 mmol/L (ref 135–145)
Total Bilirubin: 0.4 mg/dL (ref 0.3–1.2)
Total Protein: 6 g/dL — ABNORMAL LOW (ref 6.5–8.1)

## 2018-06-19 LAB — PROTEIN / CREATININE RATIO, URINE
CREATININE, URINE: 54 mg/dL
Protein Creatinine Ratio: 0.13 mg/mg{Cre} (ref 0.00–0.15)
TOTAL PROTEIN, URINE: 7 mg/dL

## 2018-06-19 LAB — TYPE AND SCREEN
ABO/RH(D): A POS
Antibody Screen: NEGATIVE

## 2018-06-19 MED ORDER — ACETAMINOPHEN 325 MG PO TABS: 650.0000 mg | ORAL_TABLET | ORAL | Status: DC | PRN

## 2018-06-19 MED ORDER — NIFEDIPINE 10 MG PO CAPS: 20.0000 mg | ORAL_CAPSULE | ORAL | Status: DC | PRN

## 2018-06-19 MED ORDER — NIFEDIPINE 10 MG PO CAPS: 10.0000 mg | ORAL_CAPSULE | ORAL | Status: DC | PRN

## 2018-06-19 MED ORDER — LABETALOL HCL 5 MG/ML IV SOLN: 40.0000 mg | INTRAVENOUS | Status: DC | PRN

## 2018-06-19 MED ORDER — LABETALOL HCL 200 MG PO TABS: 200.0000 mg | ORAL_TABLET | Freq: Three times a day (TID) | ORAL | 2 refills | Status: DC

## 2018-06-19 NOTE — Discharge Summary (Signed)
Physician Discharge Summary  Patient ID: Angela Weaver MRN: [id number] DOB/AGE: 06-17-1985 33 y.o.  Admit date: 06/19/2018 Discharge date: 06/19/2018  Admission Diagnoses:Hypertension affecting pregnancy, 35 weeks pregnancy  Discharge Diagnoses:  Active Problems:   Hypertension affecting pregnancy   Discharged Condition: good  Hospital Course: Pt seen and examined with lab work for preeclampsia negative and BP normalizing to baseline 130s/90s.  No sx's of concern.  NST R. Plan continued outpt APT an dfollow up. Cont Lbaetalol, now 200 mg TID.  Consults: None  Significant Diagnostic Studies:  Results for orders placed or performed during the hospital encounter of 06/19/18  CBC  Result Value Ref Range   WBC 5.6 4.0 - 10.5 K/uL   RBC 3.58 (L) 3.87 - 5.11 MIL/uL   Hemoglobin 10.3 (L) 12.0 - 15.0 g/dL   HCT 32.6 (L) 71.2 - 45.8 %   MCV 85.5 80.0 - 100.0 fL   MCH 28.8 26.0 - 34.0 pg   MCHC 33.7 30.0 - 36.0 g/dL   RDW 09.9 83.3 - 82.5 %   Platelets 156 150 - 400 K/uL   nRBC 0.0 0.0 - 0.2 %  Comprehensive metabolic panel  Result Value Ref Range   Sodium 136 135 - 145 mmol/L   Potassium 3.5 3.5 - 5.1 mmol/L   Chloride 105 98 - 111 mmol/L   CO2 22 22 - 32 mmol/L   Glucose, Bld 101 (H) 70 - 99 mg/dL   BUN 6 6 - 20 mg/dL   Creatinine, Ser 0.53 0.44 - 1.00 mg/dL   Calcium 8.7 (L) 8.9 - 10.3 mg/dL   Total Protein 6.0 (L) 6.5 - 8.1 g/dL   Albumin 3.0 (L) 3.5 - 5.0 g/dL   AST 16 15 - 41 U/L   ALT 9 0 - 44 U/L   Alkaline Phosphatase 98 38 - 126 U/L   Total Bilirubin 0.4 0.3 - 1.2 mg/dL   GFR calc non Af Amer >60 >60 mL/min   GFR calc Af Amer >60 >60 mL/min   Anion gap 9 5 - 15  Protein / creatinine ratio, urine  Result Value Ref Range   Creatinine, Urine 54 mg/dL   Total Protein, Urine 7 mg/dL   Protein Creatinine Ratio 0.13 0.00 - 0.15 mg/mg[Cre]  Type and screen  Result Value Ref Range   ABO/RH(D) A POS    Antibody Screen NEG    Sample Expiration       06/22/2018 Performed at [facility], [address] [place], [place] [address]    A NST procedure was performed with FHR monitoring and a normal baseline established, appropriate time of 20-40 minutes of evaluation, and accels >2 seen w 15x15 characteristics.  Results show a REACTIVE NST.   Treatments: none  Discharge Exam: Blood pressure (!) 137/95, pulse 91, temperature 97.9 F (36.6 C), temperature source Oral, resp. rate 16, height 5\' 6"  (1.676 m), weight 80.7 kg, last menstrual period 10/01/2017, currently breastfeeding. General appearance: alert, cooperative and no distress  Disposition: Discharge disposition: 01-Home or Self Care       Discharge Instructions    Call MD for:   Complete by:  As directed    Worsening contractions or pain; leakage of fluid; bleeding.   Diet general   Complete by:  As directed    Increase activity slowly   Complete by:  As directed      Allergies as of 06/19/2018      Reactions   Coconut Flavor Hives, Itching   Sulfa  Antibiotics Hives, Other (See Comments)   Told by mother    Coconut Oil Rash   Codeine Rash, Other (See Comments)   Been told by mother    Codone [hydrocodone] Anxiety      Medication List    STOP taking these medications   oseltamivir 75 MG capsule Commonly known as:  Tamiflu     TAKE these medications   acetaminophen 500 MG tablet Commonly known as:  TYLENOL Take 500 mg by mouth every 6 (six) hours as needed (for pain.).   labetalol 200 MG tablet Commonly known as:  NORMODYNE Take 1 tablet (200 mg total) by mouth 3 (three) times daily. What changed:    medication strength  when to take this   prenatal vitamin w/FE, FA 27-1 MG Tabs tablet Take 1 tablet by mouth every evening.        Signed: Letitia Libra 06/19/2018, 1:02 PM

## 2018-06-19 NOTE — Progress Notes (Signed)
  [facility] B[facility]TH[facility] INDUCTION ASSESSMENT SCHEDULING Angela Weaver 05-03-1985 Medical record #: [id number] Phone #:  Home Phone [phone]  Mobile [phone]    [facility] Provider:[facility] Proposed admission date/time:07/03/2018 Method of induction:Cytotec  Weight: 178 lbs BMI 25 HIV Negative HSV Negative EDC Estimated Date of Delivery: 4/25/20based on:US at [redacted] wks  Gestational age on admission: 37 Gravidity/parity:[id number]  Cervix Score   0 1 2 3   Position Posterior Midposition Anterior   Consistency Firm Medium Soft   Effacement (%) 0-30 40-50 60-70 >80  Dilation (cm) Closed 1-2 3-4 >5  Baby's station -3 -2 -1 +1, +2   Bishop Score:to be rechecked closer to that time   Medical induction of labor  select indication(s) below Elective induction ?39 weeks multiparous patient ?39 weeks primiparous patient with Bishop score ?7 ?40 weeks primiparous patient   Medical Indications Adapted from ACOG Committee Opinion #[id number], "Medically Indicated Late Preterm and Early Term Deliveries," 2013.  PLACENTAL / UTERINE ISSUES FETAL ISSUES MATERNAL ISSUES  ? Placenta previa (36.0-37.6) ? Isoimmunization (37.0-38.6) ? Preeclampsia without severe features or gestational HTN (37.0)  ? Suspected accreta (34.0-35.6) ? Growth Restriction [name] ? Preeclampsia with severe features (34.0)  ? Prior classical CD, uterine window, rupture (36.0-37.6) ? Isolated (38.0-39.6) ? Chronic HTN (38.0-39.6)  ? Prior myomectomy (37.0-38.6) ? Concurrent findings (34.0-37.6) ? Cholestasis (37.0)  ? Umbilical vein varix (37.0) ? Growth Restriction (Twins) ? Diabetes  ? Placental abruption (chronic) ? Di-Di Isolated (36.0-37.6) ? Pregestational, controlled (39.0)  OBSTETRIC ISSUES ? Di-Di concurrent findings (32.0-34.6) ? Pregestational, uncontrolled (37.0-39.0)  ? Postdates ? (41 weeks) ? Mo-Di isolated (32.0-34.6) ? Pregestational, vascular compromise (37.0- 39.0)   ? PPROM (34.0) ? Multiple Gestation ? Gestational, diet controlled (40.0)  ? Hx of IUFD (39.0 weeks) ? Di-Di (38.0-38.6) ? Gestational, med controlled (39.0)  ? Polyhydramnios, mild/moderate; SDV 8-16 or AFI 25-35 (39.0) ? Mo-Di (36.0-37.6) ? Gestational, uncontrolled (38.0-39.0)  ? Oligohydramnios (36.0-37.6); MVP <2 cm    Provider Signature: Letitia Libra Scheduled by:K Ophelia Charter Date:06/19/2018 1:04 PM   Call [phone] to finalize the induction date/time  [id number] (07/17)

## 2018-06-19 NOTE — Addendum Note (Signed)
Addended by: Cornelius Moras D on: 06/19/2018 10:37 AM   Modules accepted: Orders

## 2018-06-19 NOTE — Progress Notes (Signed)
Routine Prenatal Visit CC: Hypertension  HPI:  33 y.o. [id number] @ [redacted]w[redacted]d (07/21/2018, by Ultrasound).  Patient Active Problem List   Diagnosis Date Noted  . Pre-existing hypertension during pregnancy, antepartum 01/26/2018    Presents for her prenatal appointment and NST today, pregnancy complicated by HTN with some worsening days of BP recently.  She has been taken out of work that initially seemed to help. However todays exam reveals BP in the 150-160/90-110 range.  She has been on Labetalol 200 mg BID.  She denies headache, blurry vision, CP, SOB, epigastric pain, or edema at this time.  She has had headaches and epigastric pains in past, and had labs and evaluation for superimposed Preeclampsia last week with reassuring findings then.  Last [place] was last week w AFI 15, Vtx.  Prenatal care at: at [facility]. Pregnancy complicated by chronic HTN.  ROS: A review of systems was performed and negative, except as stated in the above HPI. PMHx:  Past Medical History:  Diagnosis Date  . Anemia   . Heart murmur    SAW CARDIOLOGIST IN 2011 AND EVERYTHING WAS FINE PER PT-  ASYPMTOMATIC  . Hypertension    Chronic  . Migraine    migraines  . Vaginal Pap smear, abnormal    LGSIL   PSHx:  Past Surgical History:  Procedure Laterality Date  . DILATION AND EVACUATION  04/27/2017   Procedure: DILATATION AND EVACUATION;  Surgeon: Nadara Mustard, MD;  Location: [facility] ORS;  Service: Gynecology;;  . KNEE SURGERY    . TONSILLECTOMY    . WISDOM TOOTH EXTRACTION     Medications:  Current Outpatient Medications:  .  acetaminophen (TYLENOL) 500 MG tablet, Take 500 mg by mouth every 6 (six) hours as needed (for pain.)., Disp: , Rfl:  .  labetalol (NORMODYNE) 100 MG tablet, Take 2 tablets (200 mg total) by mouth 2 (two) times daily., Disp: 360 tablet, Rfl: 0 .  oseltamivir (TAMIFLU) 75 MG capsule, Take 1 capsule (75 mg total) by mouth daily for 10 days., Disp: 10 capsule, Rfl: 0 .  prenatal vitamin w/FE, FA  (PRENATAL 1 + 1) 27-1 MG TABS tablet, Take 1 tablet by mouth every evening. , Disp: , Rfl:   Allergies: is allergic to coconut flavor; sulfa antibiotics; coconut oil; codeine; and codone [hydrocodone]. OBHx:  OB History  Gravida Para Term Preterm AB Living  7 3 3   3 3   SAB TAB Ectopic Multiple Live Births  3     0 3    # Outcome Date GA Lbr Len/2nd Weight Sex Delivery Anes PTL Lv  7 Current           6 Term 03/17/16 [redacted]w[redacted]d  7 lb 10.1 oz (3.46 kg) M Vag-Spont EPI  LIV  5 Term 01/25/13 [redacted]w[redacted]d  7 lb 10 oz (3.459 kg) M Vag-Spont   LIV  4 SAB 09/2009          3 Term 11/08/04 [redacted]w[redacted]d  7 lb 8 oz (3.402 kg) M Vag-Spont   LIV  2 SAB           1 SAB            ZRA:[id number] except as detailed in HPI.Marland Kitchen  No family history of birth defects. Soc Hx: Never smoker, Alcohol: none, Recreational drug use: none and Pregnancy welcomed  Objective:   Vitals:   06/19/18 0922  BP: (!) 150/90   Constitutional: Well nourished, well developed female in no acute distress.  HEENT: normal  Skin: Warm and dry.  Cardiovascular:Regular rate and rhythm.   Extremity: trace to 1+ bilateral pedal edema Respiratory: Clear to auscultation bilateral. Normal respiratory effort Abdomen: gravid, ND, FHT present, no tenderness on exam Back: no CVAT Neuro: DTRs 2+, Cranial nerves grossly intact Psych: Alert and Oriented x3. No memory deficits. Normal mood and affect.  MS: normal gait, normal bilateral lower extremity ROM/strength/stability.  A NST procedure was performed with FHR monitoring and a normal baseline established, appropriate time of 20-40 minutes of evaluation, and accels >2 seen w 15x15 characteristics.  Results show a REACTIVE NST.    Perinatal info:  Blood type: A positive Rubella- Immune Varicella -Not immune TDaP Given during third trimester of this pregnancy RPR NR / HIV Neg/ HBsAg Neg   Assessment & Plan:   33 y.o. [id number] @ [redacted]w[redacted]d, with chronic hypertension on meds now with worsening  BP again today; no other sx's of concern, but in need of further monitoring, evaluation for preeclampsia, and treatment of elevated blood pressures.     Annamarie Major, MD, [facility], [facility] 06/19/2018  9:54 AM

## 2018-06-21 ENCOUNTER — Other Ambulatory Visit: Payer: Self-pay

## 2018-06-21 ENCOUNTER — Ambulatory Visit
Admission: RE | Admit: 2018-06-21 | Discharge: 2018-06-21 | Disposition: A | Discharge status: Home / Self Care | Payer: BLUE CROSS/BLUE SHIELD | Source: Ambulatory Visit | Attending: Obstetrics & Gynecology | Admitting: Obstetrics & Gynecology

## 2018-06-21 DIAGNOSIS — O10919 Unspecified pre-existing hypertension complicating pregnancy, unspecified trimester: Secondary | ICD-10-CM | POA: Insufficient documentation

## 2018-06-22 ENCOUNTER — Encounter: Payer: Self-pay | Admitting: Obstetrics & Gynecology

## 2018-06-22 ENCOUNTER — Ambulatory Visit (INDEPENDENT_AMBULATORY_CARE_PROVIDER_SITE_OTHER): Payer: BLUE CROSS/BLUE SHIELD | Admitting: Obstetrics & Gynecology

## 2018-06-22 VITALS — BP 140/80 | Wt 177.0 lb

## 2018-06-22 DIAGNOSIS — O10913 Unspecified pre-existing hypertension complicating pregnancy, third trimester: Secondary | ICD-10-CM

## 2018-06-22 DIAGNOSIS — O10919 Unspecified pre-existing hypertension complicating pregnancy, unspecified trimester: Secondary | ICD-10-CM

## 2018-06-22 DIAGNOSIS — O0993 Supervision of high risk pregnancy, unspecified, third trimester: Secondary | ICD-10-CM

## 2018-06-22 DIAGNOSIS — Z3A35 35 weeks gestation of pregnancy: Secondary | ICD-10-CM | POA: Diagnosis not present

## 2018-06-22 LAB — POCT URINALYSIS DIPSTICK OB
Glucose, UA: NEGATIVE
POC,PROTEIN,UA: NEGATIVE

## 2018-06-22 NOTE — Progress Notes (Signed)
  Subjective  Fetal Movement? yes Contractions? no Leaking Fluid? no Vaginal Bleeding? no Denies headache, blurry vision, epigastric pain, CP, SOB, edema. Objective  BP 140/80   Wt 177 lb (80.3 kg)   LMP 10/01/2017   BMI 28.57 kg/m  General: NAD Pumonary: no increased work of breathing Abdomen: gravid, non-tender Extremities: no edema Psychiatric: mood appropriate, affect full  A NST procedure was performed with FHR monitoring and a normal baseline established, appropriate time of 20-40 minutes of evaluation, and accels >2 seen w 15x15 characteristics.  Results show a REACTIVE NST.   Review of ULTRASOUND.    I have personally reviewed images and report of recent ultrasound done at [facility].    Plan of management to be discussed with patient.    AFI 9, Vtx  Assessment  33 y.o. [id number] at [redacted]w[redacted]d by  07/21/2018, by Ultrasound presenting for routine prenatal visit  Plan   Problem List Items Addressed This Visit      Cardiovascular and Mediastinum   Pre-existing hypertension during pregnancy, antepartum    NST 2 time weekly    AFI weekly    IOL 4/7 at 37 weeks    Monitor for s/sx preeclampsia    Cont modifed bed rest    Cont Labetalol now 200 mg TID this week     High-risk pregnancy, third trimester     [redacted] weeks gestation of pregnancy       POC Urinalysis Dipstick OB (Completed) [facility] Plans to breast feed Vasec planned for PP BC, may need interval method until can be done Flu and TDap vaccines UTD      Annamarie Major, MD, [facility], [facility] 06/22/2018  10:50 AM

## 2018-06-26 ENCOUNTER — Other Ambulatory Visit: Payer: Self-pay

## 2018-06-26 ENCOUNTER — Encounter: Payer: Self-pay | Admitting: Obstetrics & Gynecology

## 2018-06-26 ENCOUNTER — Ambulatory Visit (INDEPENDENT_AMBULATORY_CARE_PROVIDER_SITE_OTHER): Payer: BLUE CROSS/BLUE SHIELD | Admitting: Obstetrics & Gynecology

## 2018-06-26 VITALS — BP 140/90 | Wt 179.0 lb

## 2018-06-26 DIAGNOSIS — O10913 Unspecified pre-existing hypertension complicating pregnancy, third trimester: Secondary | ICD-10-CM

## 2018-06-26 DIAGNOSIS — Z3A36 36 weeks gestation of pregnancy: Secondary | ICD-10-CM | POA: Diagnosis not present

## 2018-06-26 DIAGNOSIS — O163 Unspecified maternal hypertension, third trimester: Secondary | ICD-10-CM

## 2018-06-26 DIAGNOSIS — O10919 Unspecified pre-existing hypertension complicating pregnancy, unspecified trimester: Secondary | ICD-10-CM

## 2018-06-26 DIAGNOSIS — O0993 Supervision of high risk pregnancy, unspecified, third trimester: Secondary | ICD-10-CM

## 2018-06-26 DIAGNOSIS — Z3483 Encounter for supervision of other normal pregnancy, third trimester: Secondary | ICD-10-CM

## 2018-06-26 LAB — POCT URINALYSIS DIPSTICK OB: GLUCOSE, UA: NEGATIVE

## 2018-06-26 NOTE — Patient Instructions (Signed)
Labor Induction    Labor induction is when steps are taken to cause a pregnant woman to begin the labor process. Most women go into labor on their own between 37 weeks and 42 weeks of pregnancy. When this does not happen or when there is a medical need for labor to begin, steps may be taken to induce labor. Labor induction causes a pregnant woman's uterus to contract. It also causes the cervix to soften (ripen), open (dilate), and thin out (efface). Usually, labor is not induced before 39 weeks of pregnancy unless there is a medical reason to do so. Your health care provider will determine if labor induction is needed.  Before inducing labor, your health care provider will consider a number of factors, including:  · Your medical condition and your baby's.  · How many weeks along you are in your pregnancy.  · How mature your baby's lungs are.  · The condition of your cervix.  · The position of your baby.  · The size of your birth canal.  What are some reasons for labor induction?  Labor may be induced if:  · Your health or your baby's health is at risk.  · Your pregnancy is overdue by 1 week or more.  · Your water breaks but labor does not start on its own.  · There is a low amount of amniotic fluid around your baby.  You may also choose (elect) to have labor induced at a certain time. Generally, elective labor induction is done no earlier than 39 weeks of pregnancy.  What methods are used for labor induction?  Methods used for labor induction include:  · Prostaglandin medicine. This medicine starts contractions and causes the cervix to dilate and ripen. It can be taken by mouth (orally) or by being inserted into the vagina (suppository).  · Inserting a small, thin tube (catheter) with a balloon into the vagina and then expanding the balloon with water to dilate the cervix.  · Stripping the membranes. In this method, your health care provider gently separates amniotic sac tissue from the cervix. This causes the  cervix to stretch, which in turn causes the release of a hormone called progesterone. The hormone causes the uterus to contract. This procedure is often done during an office visit, after which you will be sent home to wait for contractions to begin.  · Breaking the water. In this method, your health care provider uses a small instrument to make a small hole in the amniotic sac. This eventually causes the amniotic sac to break. Contractions should begin after a few hours.  · Medicine to trigger or strengthen contractions. This medicine is given through an IV that is inserted into a vein in your arm.  Except for membrane stripping, which can be done in a clinic, labor induction is done in the hospital so that you and your baby can be carefully monitored.  How long does it take for labor to be induced?  The length of time it takes to induce labor depends on how ready your body is for labor. Some inductions can take up to 2-3 days, while others may take less than a day. Induction may take longer if:  · You are induced early in your pregnancy.  · It is your first pregnancy.  · Your cervix is not ready.  What are some risks associated with labor induction?  Some risks associated with labor induction include:  · Changes in fetal heart rate, such as being too   high, too low, or irregular (erratic).  · Failed induction.  · Infection in the mother or the baby.  · Increased risk of having a cesarean delivery.  · Fetal death.  · Breaking off (abruption) of the placenta from the uterus (rare).  · Rupture of the uterus (very rare).  When induction is needed for medical reasons, the benefits of induction generally outweigh the risks.  What are some reasons for not inducing labor?  Labor induction should not be done if:  · Your baby does not tolerate contractions.  · You have had previous surgeries on your uterus, such as a myomectomy, removal of fibroids, or a vertical scar from a previous cesarean delivery.  · Your placenta lies  very low in your uterus and blocks the opening of the cervix (placenta previa).  · Your baby is not in a head-down position.  · The umbilical cord drops down into the birth canal in front of the baby.  · There are unusual circumstances, such as the baby being very early (premature).  · You have had more than 2 previous cesarean deliveries.  Summary  · Labor induction is when steps are taken to cause a pregnant woman to begin the labor process.  · Labor induction causes a pregnant woman's uterus to contract. It also causes the cervix to ripen, dilate, and efface.  · Labor is not induced before 39 weeks of pregnancy unless there is a medical reason to do so.  · When induction is needed for medical reasons, the benefits of induction generally outweigh the risks.  This information is not intended to replace advice given to you by your health care provider. Make sure you discuss any questions you have with your health care provider.  Document Released: 08/03/2006 Document Revised: 04/27/2016 Document Reviewed: 04/27/2016  Elsevier Interactive Patient Education © 2019 [employer]vier Inc[employer]

## 2018-06-26 NOTE — Progress Notes (Signed)
  Subjective  Fetal Movement? yes Contractions? no Leaking Fluid? no Vaginal Bleeding? no  Objective  BP 140/90   Wt 179 lb (81.2 kg)   LMP 10/01/2017   BMI 28.89 kg/m  General: NAD Pumonary: no increased work of breathing Abdomen: gravid, non-tender Extremities: no edema Psychiatric: mood appropriate, affect full SVE: FT/30/-3, Vtx  A NST procedure was performed with FHR monitoring and a normal baseline established, appropriate time of 20-40 minutes of evaluation, and accels >2 seen w 15x15 characteristics.  Results show a REACTIVE NST.   Assessment  33 y.o. [id number] at [redacted]w[redacted]d by  07/21/2018, by Ultrasound presenting for routine prenatal visit  Plan   Problem List Items Addressed This Visit      Cardiovascular and Mediastinum   Hypertension affecting pregnancy in third trimester   Relevant Orders   Protein / creatinine ratio, urine   CBC With Differential   Comprehensive metabolic panel   Pre-existing hypertension during pregnancy, antepartum - Primary     Other   Supervision of normal pregnancy   High-risk pregnancy, third trimester    Other Visit Diagnoses    [redacted] weeks gestation of pregnancy       Relevant Orders   Culture, beta strep (group b only)   POC Urinalysis Dipstick OB (Completed)     Review of ULTRASOUND from last week.      I have personally reviewed images and report of recent ultrasound done at [facility].    Plan of management to be discussed with patient.    Repeat AFI Thursday  Risks of HTN and pregnancy discussed    Repeat labs today  Annamarie Major, MD, [facility], [facility] 06/26/2018  10:15 AM

## 2018-06-27 LAB — PROTEIN / CREATININE RATIO, URINE
Creatinine, Urine: 84.9 mg/dL
Protein, Ur: 17.1 mg/dL
Protein/Creat Ratio: 201 mg/g creat — ABNORMAL HIGH (ref 0–200)

## 2018-06-27 LAB — CBC WITH DIFFERENTIAL
Basophils Absolute: 0 10*3/uL (ref 0.0–0.2)
Basos: 0 %
EOS (ABSOLUTE): 0 10*3/uL (ref 0.0–0.4)
Eos: 1 %
Hematocrit: 30.5 % — ABNORMAL LOW (ref 34.0–46.6)
Hemoglobin: 10.7 g/dL — ABNORMAL LOW (ref 11.1–15.9)
IMMATURE GRANULOCYTES: 1 %
Immature Grans (Abs): 0.1 10*3/uL (ref 0.0–0.1)
Lymphocytes Absolute: 1 10*3/uL (ref 0.7–3.1)
Lymphs: 16 %
MCH: 29.2 pg (ref 26.6–33.0)
MCHC: 35.1 g/dL (ref 31.5–35.7)
MCV: 83 fL (ref 79–97)
Monocytes Absolute: 0.4 10*3/uL (ref 0.1–0.9)
Monocytes: 7 %
Neutrophils Absolute: 4.6 10*3/uL (ref 1.4–7.0)
Neutrophils: 75 %
RBC: 3.66 x10E6/uL — ABNORMAL LOW (ref 3.77–5.28)
RDW: 13.4 % (ref 11.7–15.4)
WBC: 6.1 10*3/uL (ref 3.4–10.8)

## 2018-06-27 LAB — COMPREHENSIVE METABOLIC PANEL
ALBUMIN: 3.6 g/dL — AB (ref 3.8–4.8)
ALT: 6 IU/L (ref 0–32)
AST: 14 IU/L (ref 0–40)
Albumin/Globulin Ratio: 1.8 (ref 1.2–2.2)
Alkaline Phosphatase: 132 IU/L — ABNORMAL HIGH (ref 39–117)
BUN/Creatinine Ratio: 9 (ref 9–23)
BUN: 6 mg/dL (ref 6–20)
Bilirubin Total: 0.3 mg/dL (ref 0.0–1.2)
CO2: 23 mmol/L (ref 20–29)
Calcium: 9.1 mg/dL (ref 8.7–10.2)
Chloride: 104 mmol/L (ref 96–106)
Creatinine, Ser: 0.64 mg/dL (ref 0.57–1.00)
GFR calc Af Amer: 135 mL/min/{1.73_m2} (ref >59)
GFR calc non Af Amer: 117 mL/min/{1.73_m2} (ref >59)
GLUCOSE: 78 mg/dL (ref 65–99)
Globulin, Total: 2 g/dL (ref 1.5–4.5)
Potassium: 4.1 mmol/L (ref 3.5–5.2)
Sodium: 140 mmol/L (ref 134–144)
Total Protein: 5.6 g/dL — ABNORMAL LOW (ref 6.0–8.5)

## 2018-06-28 ENCOUNTER — Ambulatory Visit
Admission: RE | Admit: 2018-06-28 | Discharge: 2018-06-28 | Disposition: A | Discharge status: Home / Self Care | Payer: BLUE CROSS/BLUE SHIELD | Source: Ambulatory Visit | Attending: Obstetrics & Gynecology | Admitting: Obstetrics & Gynecology

## 2018-06-28 ENCOUNTER — Other Ambulatory Visit: Payer: Self-pay

## 2018-06-28 DIAGNOSIS — O10919 Unspecified pre-existing hypertension complicating pregnancy, unspecified trimester: Secondary | ICD-10-CM | POA: Diagnosis present

## 2018-06-29 ENCOUNTER — Other Ambulatory Visit: Payer: Self-pay | Admitting: Obstetrics & Gynecology

## 2018-06-29 ENCOUNTER — Ambulatory Visit (INDEPENDENT_AMBULATORY_CARE_PROVIDER_SITE_OTHER): Payer: BLUE CROSS/BLUE SHIELD | Admitting: Obstetrics & Gynecology

## 2018-06-29 ENCOUNTER — Encounter: Payer: Self-pay | Admitting: Obstetrics & Gynecology

## 2018-06-29 VITALS — BP 160/90 | Wt 179.0 lb

## 2018-06-29 DIAGNOSIS — Z3A36 36 weeks gestation of pregnancy: Secondary | ICD-10-CM

## 2018-06-29 DIAGNOSIS — Z3483 Encounter for supervision of other normal pregnancy, third trimester: Secondary | ICD-10-CM

## 2018-06-29 DIAGNOSIS — O10919 Unspecified pre-existing hypertension complicating pregnancy, unspecified trimester: Secondary | ICD-10-CM

## 2018-06-29 DIAGNOSIS — O10913 Unspecified pre-existing hypertension complicating pregnancy, third trimester: Secondary | ICD-10-CM | POA: Diagnosis not present

## 2018-06-29 LAB — POCT URINALYSIS DIPSTICK OB: Glucose, UA: NEGATIVE

## 2018-06-29 MED ORDER — NIFEDIPINE ER OSMOTIC RELEASE 30 MG PO TB24: 30.0000 mg | ORAL_TABLET | Freq: Every day | ORAL | 2 refills | Status: DC

## 2018-06-29 NOTE — Progress Notes (Signed)
Discussed BP pattern after several home measurements 156/89, 164/90, 152/85, 153/84 She has rested, took her Labetalol.  No s/sx preeclampsia. Offered hospital monitoring, moving up IOL, home monitoring.    She prefers to add second agent and home monitor for now.    She is a compliant patient and will cont w hourly BP checks at home    Procardia 30 mg XL daily added, first dose soon    Appt Monday, IOL Tues (>37 weeks).    S/sx to monitor for preeclampsia discussed  Annamarie Major, MD, [facility], [facility] 06/29/2018  3:56 PM

## 2018-06-29 NOTE — Patient Instructions (Signed)
Labor Induction    Labor induction is when steps are taken to cause a pregnant woman to begin the labor process. Most women go into labor on their own between 37 weeks and 42 weeks of pregnancy. When this does not happen or when there is a medical need for labor to begin, steps may be taken to induce labor. Labor induction causes a pregnant woman's uterus to contract. It also causes the cervix to soften (ripen), open (dilate), and thin out (efface). Usually, labor is not induced before 39 weeks of pregnancy unless there is a medical reason to do so. Your health care provider will determine if labor induction is needed.  Before inducing labor, your health care provider will consider a number of factors, including:  · Your medical condition and your baby's.  · How many weeks along you are in your pregnancy.  · How mature your baby's lungs are.  · The condition of your cervix.  · The position of your baby.  · The size of your birth canal.  What are some reasons for labor induction?  Labor may be induced if:  · Your health or your baby's health is at risk.  · Your pregnancy is overdue by 1 week or more.  · Your water breaks but labor does not start on its own.  · There is a low amount of amniotic fluid around your baby.  You may also choose (elect) to have labor induced at a certain time. Generally, elective labor induction is done no earlier than 39 weeks of pregnancy.  What methods are used for labor induction?  Methods used for labor induction include:  · Prostaglandin medicine. This medicine starts contractions and causes the cervix to dilate and ripen. It can be taken by mouth (orally) or by being inserted into the vagina (suppository).  · Inserting a small, thin tube (catheter) with a balloon into the vagina and then expanding the balloon with water to dilate the cervix.  · Stripping the membranes. In this method, your health care provider gently separates amniotic sac tissue from the cervix. This causes the  cervix to stretch, which in turn causes the release of a hormone called progesterone. The hormone causes the uterus to contract. This procedure is often done during an office visit, after which you will be sent home to wait for contractions to begin.  · Breaking the water. In this method, your health care provider uses a small instrument to make a small hole in the amniotic sac. This eventually causes the amniotic sac to break. Contractions should begin after a few hours.  · Medicine to trigger or strengthen contractions. This medicine is given through an IV that is inserted into a vein in your arm.  Except for membrane stripping, which can be done in a clinic, labor induction is done in the hospital so that you and your baby can be carefully monitored.  How long does it take for labor to be induced?  The length of time it takes to induce labor depends on how ready your body is for labor. Some inductions can take up to 2-3 days, while others may take less than a day. Induction may take longer if:  · You are induced early in your pregnancy.  · It is your first pregnancy.  · Your cervix is not ready.  What are some risks associated with labor induction?  Some risks associated with labor induction include:  · Changes in fetal heart rate, such as being too   high, too low, or irregular (erratic).  · Failed induction.  · Infection in the mother or the baby.  · Increased risk of having a cesarean delivery.  · Fetal death.  · Breaking off (abruption) of the placenta from the uterus (rare).  · Rupture of the uterus (very rare).  When induction is needed for medical reasons, the benefits of induction generally outweigh the risks.  What are some reasons for not inducing labor?  Labor induction should not be done if:  · Your baby does not tolerate contractions.  · You have had previous surgeries on your uterus, such as a myomectomy, removal of fibroids, or a vertical scar from a previous cesarean delivery.  · Your placenta lies  very low in your uterus and blocks the opening of the cervix (placenta previa).  · Your baby is not in a head-down position.  · The umbilical cord drops down into the birth canal in front of the baby.  · There are unusual circumstances, such as the baby being very early (premature).  · You have had more than 2 previous cesarean deliveries.  Summary  · Labor induction is when steps are taken to cause a pregnant woman to begin the labor process.  · Labor induction causes a pregnant woman's uterus to contract. It also causes the cervix to ripen, dilate, and efface.  · Labor is not induced before 39 weeks of pregnancy unless there is a medical reason to do so.  · When induction is needed for medical reasons, the benefits of induction generally outweigh the risks.  This information is not intended to replace advice given to you by your health care provider. Make sure you discuss any questions you have with your health care provider.  Document Released: 08/03/2006 Document Revised: 04/27/2016 Document Reviewed: 04/27/2016  Elsevier Interactive Patient Education © 2019 [employer]vier Inc[employer]

## 2018-06-29 NOTE — Progress Notes (Signed)
  Subjective  Fetal Movement? yes Contractions? no Leaking Fluid? no Vaginal Bleeding? no Denies headache, blurry vision, CP, SOB, epigastric pain, edema Objective  BP (!) 160/90   Wt 179 lb (81.2 kg)   LMP 10/01/2017   BMI 28.89 kg/m  Repeat BP 160/110 General: NAD Pumonary: no increased work of breathing Abdomen: gravid, non-tender Extremities: no edema Psychiatric: mood appropriate, affect full  A NST procedure was performed with FHR monitoring and a normal baseline established, appropriate time of 20-40 minutes of evaluation, and accels >2 seen w 15x15 characteristics.  Results show a REACTIVE NST.   Review of ULTRASOUND.    I have personally reviewed images and report of recent ultrasound done at [facility].    Plan of management to be discussed with patient.    AFI 13, Vtx  Assessment  33 y.o. [id number] at [redacted]w[redacted]d by  07/20/32, by Ultrasound presenting for routine prenatal visit  Plan   Problem List Items Addressed This Visit      Cardiovascular and Mediastinum   Pre-existing hypertension during pregnancy, antepartum     Other   Supervision of normal pregnancy    Other Visit Diagnoses    [redacted] weeks gestation of pregnancy    -  Primary   Relevant Orders   POC Urinalysis Dipstick OB (Completed)    Plan IOL at 37 weeks, sooner if s/sx preeclampsia Pt will check blood pressure hourly at home and call results to me in 4 hours.  Options for hospital monitroing discussed as well.  She has updated/normal APT (NST, AFI), urine and blood testing, and no symptoms of preeclampsia at this time.  Counseled on s/sx to watch for.  NST sch for Monday, IOL Tues.  Annamarie Major, MD, [facility], [facility] 06/29/2018  9:42 AM

## 2018-06-30 LAB — CULTURE, BETA STREP (GROUP B ONLY): Strep Gp B Culture: NEGATIVE

## 2018-07-02 ENCOUNTER — Other Ambulatory Visit: Payer: Self-pay

## 2018-07-02 ENCOUNTER — Ambulatory Visit (INDEPENDENT_AMBULATORY_CARE_PROVIDER_SITE_OTHER): Payer: BLUE CROSS/BLUE SHIELD | Admitting: Obstetrics & Gynecology

## 2018-07-02 ENCOUNTER — Encounter: Payer: Self-pay | Admitting: Obstetrics & Gynecology

## 2018-07-02 VITALS — BP 140/80 | Wt 180.0 lb

## 2018-07-02 DIAGNOSIS — Z3A37 37 weeks gestation of pregnancy: Secondary | ICD-10-CM

## 2018-07-02 DIAGNOSIS — O10919 Unspecified pre-existing hypertension complicating pregnancy, unspecified trimester: Secondary | ICD-10-CM

## 2018-07-02 DIAGNOSIS — O10913 Unspecified pre-existing hypertension complicating pregnancy, third trimester: Secondary | ICD-10-CM | POA: Diagnosis not present

## 2018-07-02 DIAGNOSIS — Z3483 Encounter for supervision of other normal pregnancy, third trimester: Secondary | ICD-10-CM

## 2018-07-02 DIAGNOSIS — O163 Unspecified maternal hypertension, third trimester: Secondary | ICD-10-CM

## 2018-07-02 LAB — POCT URINALYSIS DIPSTICK OB: Glucose, UA: NEGATIVE

## 2018-07-02 NOTE — Progress Notes (Signed)
History and Physical  Angela Weaver is a 33 y.o. [phone] [redacted]w[redacted]d  for Induction of Labor scheduled due to Chronic Hypertension; has been on medical therapy during this pregnancy and has had to have recent adjustments due to elevated blood pressuresmore so here close to term.  She is taking Labetalol 200 mg TID and Procardia XL 30 mg daily. .   See labor record for pregnancy highlights.  No recent pain, bleeding, ruptured membranes, or other signs of progressing labor.  PMHx: She  has a past medical history of Anemia, Heart murmur, Hypertension, Migraine, and Vaginal Pap smear, abnormal. Also,  has a past surgical history that includes Knee surgery; Tonsillectomy; Wisdom tooth extraction; and Dilation and evacuation (04/27/2017)., family history includes Diabetes in her maternal grandfather; Melanoma (age of onset: 70) in her maternal aunt; Melanoma (age of onset: 66) in her maternal grandmother.,  reports that she quit smoking about 7 years ago. Her smoking use included cigarettes. She has a 0.50 pack-year smoking history. She has never used smokeless tobacco. She reports that she does not drink alcohol or use drugs. She has a current medication list which includes the following prescription(s): acetaminophen, labetalol, nifedipine, and prenatal vitamin w/fe, fa. Also, is allergic to coconut flavor; sulfa antibiotics; coconut oil; codeine; and codone [hydrocodone]. OB History  Gravida Para Term Preterm AB Living  7 3 3   3 3   SAB TAB Ectopic Multiple Live Births  3     0 3    # Outcome Date GA Lbr Len/2nd Weight Sex Delivery Anes PTL Lv  7 Current           6 Term 03/17/16 [redacted]w[redacted]d  7 lb 10.1 oz (3.46 kg) M Vag-Spont EPI  LIV  5 Term 01/25/13 [redacted]w[redacted]d  7 lb 10 oz (3.459 kg) M Vag-Spont   LIV  4 SAB 09/2009          3 Term 11/08/04 [redacted]w[redacted]d  7 lb 8 oz (3.402 kg) M Vag-Spont   LIV  2 SAB           1 SAB           Patient denies any other pertinent gynecologic issues.   Review of Systems   Constitutional: Negative for chills, fever and malaise/fatigue.  HENT: Negative for congestion, sinus pain and sore throat.   Eyes: Negative for blurred vision and pain.  Respiratory: Negative for cough and wheezing.   Cardiovascular: Negative for chest pain and leg swelling.  Gastrointestinal: Negative for abdominal pain, constipation, diarrhea, heartburn, nausea and vomiting.  Genitourinary: Negative for dysuria, frequency, hematuria and urgency.  Musculoskeletal: Negative for back pain, joint pain, myalgias and neck pain.  Skin: Negative for itching and rash.  Neurological: Negative for dizziness, tremors and weakness.  Endo/Heme/Allergies: Does not bruise/bleed easily.  Psychiatric/Behavioral: Negative for depression. The patient is not nervous/anxious and does not have insomnia.     Objective: BP 140/80   Wt 180 lb (81.6 kg)   LMP 10/01/2017   BMI 29.05 kg/m  Physical Exam Vitals signs reviewed.    Constitutional NAD, Conversant  Skin No rashes, lesions or ulceration. Normal palpated skin turgor. No nodularity.  Lungs: Clear to auscultation.No rales or wheezes. Normal Respiratory effort, no retractions.  Heart: NSR.  No murmurs or rubs appreciated. No periferal edema  Abdomen: Gravid.  Non-tender.  No masses.  No HSM. No hernia  Extremities: Moves all appropriately.  Normal ROM for age. No lymphadenopathy.  Neuro: Grossly intact  Psych: Oriented  to PPT.  Normal mood. Normal affect.     Pelvic:   Vulva: Normal appearance.  No lesions.  Vagina: No lesions or abnormalities noted.  Urethra No masses tenderness or scarring.  Meatus Normal size without lesions or prolapse.  Cervix: FT/30/-3, VTX.   Perineum: Normal exam.  No lesions.        Bimanual   Uterus: Enlarged.  Non-tender.    Adnexae: Not palpated.  Cul-de-sac: Negative for abnormality.    Assessment: Term Pregnancy for Induction of Labor due to Chronic Hypertension.  A NST procedure was performed with FHR  monitoring and a normal baseline established, appropriate time of 20-40 minutes of evaluation, and accels >2 seen w 15x15 characteristics.  Results show a REACTIVE NST.   Plan: Patient will undergo induction of labor with cervical ripening agents.     Patient has been fully informed of the pros and cons, risks and benefits of continued observation with fetal monitoring versus that of induction of labor.   She understands that there are uncommon risks to induction, which include but are not limited to : frequent or prolonged uterine contractions, fetal distress, uterine rupture, and lack of successful induction.  These risks include all methods including Pitocin and Misoprostol and Cervadil.  Patient understands that using Misoprostol for labor induction is an "off label" indication although it has been studied extensively for this purpose and is an accepted method of induction.  She also has been informed of the increased risks for Cesarean with induction and should induction not be successful.  Patient consents to the induction plan of management.  Plans to breast feed Plans vasectomy, but possible pill or Depo until he is able to get that done, for contraception TDaP UTD 05/14/2018  Annamarie MajorPaul Lavenia Stumpo, MD, [facility] FrederickFACOG [facility], [facility] 07/02/2018  10:19 AM

## 2018-07-03 ENCOUNTER — Inpatient Hospital Stay
Admission: RE | Admit: 2018-07-03 | Discharge: 2018-07-05 | DRG: 807 | Disposition: A | Discharge status: Home / Self Care | Payer: BLUE CROSS/BLUE SHIELD | Attending: Obstetrics & Gynecology | Admitting: Obstetrics & Gynecology

## 2018-07-03 ENCOUNTER — Other Ambulatory Visit: Payer: Self-pay

## 2018-07-03 DIAGNOSIS — Z3483 Encounter for supervision of other normal pregnancy, third trimester: Secondary | ICD-10-CM

## 2018-07-03 DIAGNOSIS — Z3A37 37 weeks gestation of pregnancy: Secondary | ICD-10-CM

## 2018-07-03 DIAGNOSIS — O1002 Pre-existing essential hypertension complicating childbirth: Secondary | ICD-10-CM | POA: Diagnosis present

## 2018-07-03 DIAGNOSIS — Z3491 Encounter for supervision of normal pregnancy, unspecified, first trimester: Secondary | ICD-10-CM

## 2018-07-03 DIAGNOSIS — O10919 Unspecified pre-existing hypertension complicating pregnancy, unspecified trimester: Secondary | ICD-10-CM

## 2018-07-03 DIAGNOSIS — Z3490 Encounter for supervision of normal pregnancy, unspecified, unspecified trimester: Secondary | ICD-10-CM | POA: Diagnosis present

## 2018-07-03 DIAGNOSIS — O1092 Unspecified pre-existing hypertension complicating childbirth: Secondary | ICD-10-CM | POA: Diagnosis not present

## 2018-07-03 DIAGNOSIS — O10913 Unspecified pre-existing hypertension complicating pregnancy, third trimester: Secondary | ICD-10-CM

## 2018-07-03 LAB — CBC
HCT: 28.3 % — ABNORMAL LOW (ref 36.0–46.0)
Hemoglobin: 9.7 g/dL — ABNORMAL LOW (ref 12.0–15.0)
MCH: 29.1 pg (ref 26.0–34.0)
MCHC: 34.3 g/dL (ref 30.0–36.0)
MCV: 85 fL (ref 80.0–100.0)
Platelets: 137 10*3/uL — ABNORMAL LOW (ref 150–400)
RBC: 3.33 MIL/uL — ABNORMAL LOW (ref 3.87–5.11)
RDW: 13.4 % (ref 11.5–15.5)
WBC: 5.3 10*3/uL (ref 4.0–10.5)
nRBC: 0 % (ref 0.0–0.2)

## 2018-07-03 LAB — COMPREHENSIVE METABOLIC PANEL
ALT: 9 U/L (ref 0–44)
AST: 18 U/L (ref 15–41)
Albumin: 2.9 g/dL — ABNORMAL LOW (ref 3.5–5.0)
Alkaline Phosphatase: 127 U/L — ABNORMAL HIGH (ref 38–126)
Anion gap: 9 (ref 5–15)
BUN: 6 mg/dL (ref 6–20)
CO2: 19 mmol/L — ABNORMAL LOW (ref 22–32)
Calcium: 8.2 mg/dL — ABNORMAL LOW (ref 8.9–10.3)
Chloride: 108 mmol/L (ref 98–111)
Creatinine, Ser: 0.69 mg/dL (ref 0.44–1.00)
GFR calc Af Amer: >60 mL/min (ref >60)
GFR calc non Af Amer: >60 mL/min (ref >60)
Glucose, Bld: 119 mg/dL — ABNORMAL HIGH (ref 70–99)
Potassium: 3.2 mmol/L — ABNORMAL LOW (ref 3.5–5.1)
Sodium: 136 mmol/L (ref 135–145)
Total Bilirubin: 0.3 mg/dL (ref 0.3–1.2)
Total Protein: 5.9 g/dL — ABNORMAL LOW (ref 6.5–8.1)

## 2018-07-03 LAB — TYPE AND SCREEN
ABO/RH(D): A POS
Antibody Screen: NEGATIVE

## 2018-07-03 MED ORDER — TERBUTALINE SULFATE 1 MG/ML IJ SOLN: 0.2500 mg | Freq: Once | INTRAMUSCULAR | Status: DC | PRN

## 2018-07-03 MED ORDER — OXYTOCIN 40 UNITS IN NORMAL SALINE INFUSION - SIMPLE MED
2.5000 [IU]/h | INTRAVENOUS | Status: DC
  Administered 2018-07-04: 15:00:00 2.5 [IU]/h via INTRAVENOUS
  Filled 2018-07-03 (×2): qty 1000

## 2018-07-03 MED ORDER — LABETALOL HCL 5 MG/ML IV SOLN
80.0000 mg | INTRAVENOUS | Status: DC | PRN
  Filled 2018-07-03: qty 16

## 2018-07-03 MED ORDER — LACTATED RINGERS IV SOLN
INTRAVENOUS | Status: DC
  Administered 2018-07-03 – 2018-07-04 (×3): via INTRAVENOUS

## 2018-07-03 MED ORDER — LABETALOL HCL 5 MG/ML IV SOLN
20.0000 mg | INTRAVENOUS | Status: DC | PRN
  Filled 2018-07-03: qty 4

## 2018-07-03 MED ORDER — LIDOCAINE HCL (PF) 1 % IJ SOLN
30.0000 mL | INTRAMUSCULAR | Status: AC | PRN
  Administered 2018-07-04: 12:00:00 30 mL via SUBCUTANEOUS
  Filled 2018-07-03: qty 30

## 2018-07-03 MED ORDER — LACTATED RINGERS IV SOLN: 500.0000 mL | INTRAVENOUS | Status: DC | PRN

## 2018-07-03 MED ORDER — BUTORPHANOL TARTRATE 1 MG/ML IJ SOLN
1.0000 mg | INTRAMUSCULAR | Status: DC | PRN
  Administered 2018-07-03 – 2018-07-04 (×3): 1 mg via INTRAVENOUS
  Filled 2018-07-03 (×3): qty 1

## 2018-07-03 MED ORDER — MISOPROSTOL 200 MCG PO TABS
ORAL_TABLET | ORAL | Status: AC
  Administered 2018-07-03: 25 ug via VAGINAL
  Filled 2018-07-03: qty 4

## 2018-07-03 MED ORDER — LABETALOL HCL 5 MG/ML IV SOLN
40.0000 mg | INTRAVENOUS | Status: DC | PRN
  Filled 2018-07-03: qty 8

## 2018-07-03 MED ORDER — OXYTOCIN 40 UNITS IN NORMAL SALINE INFUSION - SIMPLE MED
1.0000 m[IU]/min | INTRAVENOUS | Status: DC
  Administered 2018-07-03: 18:00:00 2 m[IU]/min via INTRAVENOUS

## 2018-07-03 MED ORDER — HYDRALAZINE HCL 20 MG/ML IJ SOLN: 10.0000 mg | INTRAMUSCULAR | Status: DC | PRN

## 2018-07-03 MED ORDER — OXYTOCIN 10 UNIT/ML IJ SOLN
INTRAMUSCULAR | Status: AC
  Filled 2018-07-03: qty 2

## 2018-07-03 MED ORDER — ONDANSETRON HCL 4 MG/2ML IJ SOLN: 4.0000 mg | Freq: Four times a day (QID) | INTRAMUSCULAR | Status: DC | PRN

## 2018-07-03 MED ORDER — AMMONIA AROMATIC IN INHA
RESPIRATORY_TRACT | Status: AC
  Filled 2018-07-03: qty 10

## 2018-07-03 MED ORDER — ACETAMINOPHEN 325 MG PO TABS: 650.0000 mg | ORAL_TABLET | ORAL | Status: DC | PRN

## 2018-07-03 MED ORDER — MISOPROSTOL 25 MCG QUARTER TABLET
25.0000 ug | ORAL_TABLET | ORAL | Status: DC | PRN
  Administered 2018-07-03 (×2): 25 ug via VAGINAL
  Filled 2018-07-03 (×2): qty 1

## 2018-07-03 MED ORDER — OXYTOCIN BOLUS FROM INFUSION
500.0000 mL | Freq: Once | INTRAVENOUS | Status: AC
  Administered 2018-07-04: 500 mL via INTRAVENOUS

## 2018-07-03 NOTE — Progress Notes (Signed)
  Labor Progress Note   33 y.o. [id number] @ [redacted]w[redacted]d , admitted for  Pregnancy, Labor Management.   Subjective:  No pain.  2 dose Cytotec given.  Objective:  BP 136/90   Pulse 84   Temp 97.9 F (36.6 C) (Oral)   Resp 16   Ht 5\' 6"  (1.676 m)   Wt 81.6 kg   LMP 10/01/2017   BMI 29.05 kg/m  Abd: gravid, ND, FHT present, mild tenderness on exam Extr: trace to 1+ bilateral pedal edema SVE: CERVIX: FT cm dilated, 80 effaced, -3 station  EFM: FHR: 140 bpm, variability: moderate,  accelerations:  Present,  decelerations:  Absent Toco: irreg Labs: I have reviewed the patient's lab results.   Assessment & Plan:  [id number] @ [redacted]w[redacted]d, admitted for  Pregnancy and Labor/Delivery Management  1. Pain management: none. 2. FWB: FHT category 1.  3. ID: GBS negative 4. Labor management: Pitocin to start now.  All discussed with patient, see orders  Annamarie Major, MD, [facility], [facility] 07/03/2018  4:57 PM

## 2018-07-03 NOTE — Plan of Care (Signed)
Patient oriented to unit, plan pf care for IOL, medications, care of infant after delivery

## 2018-07-03 NOTE — H&P (Signed)
History and Physical Interval Note:  07/03/2018 9:04 AM  Angela Weaver  has presented today for INDUCTION OF LABOR (cervical ripening agents),  with the diagnosis of chronic hypertension, 37 weeks. The various methods of treatment have been discussed with the patient and family. After consideration of risks, benefits and other options for treatment, the patient has consented to  Labor induction .  The patient's history has been reviewed, patient examined, no change in status, and is stable for induction as planned.  See H&P. I have reviewed the patient's chart and labs.  Questions were answered to the patient's satisfaction.    Annamarie Major, MD, [facility], [facility] 07/03/2018  9:04 AM

## 2018-07-04 ENCOUNTER — Encounter: Payer: Self-pay | Admitting: Anesthesiology

## 2018-07-04 ENCOUNTER — Inpatient Hospital Stay: Payer: BLUE CROSS/BLUE SHIELD | Admitting: Anesthesiology

## 2018-07-04 DIAGNOSIS — O1092 Unspecified pre-existing hypertension complicating childbirth: Secondary | ICD-10-CM

## 2018-07-04 DIAGNOSIS — Z3490 Encounter for supervision of normal pregnancy, unspecified, unspecified trimester: Secondary | ICD-10-CM | POA: Diagnosis present

## 2018-07-04 LAB — RPR: RPR Ser Ql: NONREACTIVE

## 2018-07-04 MED ORDER — FERROUS SULFATE 325 (65 FE) MG PO TABS
325.0000 mg | ORAL_TABLET | Freq: Two times a day (BID) | ORAL | Status: DC
  Administered 2018-07-04 – 2018-07-05 (×2): 325 mg via ORAL
  Filled 2018-07-04 (×2): qty 1

## 2018-07-04 MED ORDER — WITCH HAZEL-GLYCERIN EX PADS: 1.0000 "application " | MEDICATED_PAD | CUTANEOUS | Status: DC | PRN

## 2018-07-04 MED ORDER — DIPHENHYDRAMINE HCL 25 MG PO CAPS: 25.0000 mg | ORAL_CAPSULE | Freq: Four times a day (QID) | ORAL | Status: DC | PRN

## 2018-07-04 MED ORDER — ONDANSETRON HCL 4 MG/2ML IJ SOLN: 4.0000 mg | INTRAMUSCULAR | Status: DC | PRN

## 2018-07-04 MED ORDER — DIBUCAINE (PERIANAL) 1 % EX OINT: 1.0000 "application " | TOPICAL_OINTMENT | CUTANEOUS | Status: DC | PRN

## 2018-07-04 MED ORDER — SODIUM CHLORIDE 0.9 % IV SOLN: 250.0000 mL | INTRAVENOUS | Status: DC | PRN

## 2018-07-04 MED ORDER — PRENATAL MULTIVITAMIN CH
1.0000 | ORAL_TABLET | Freq: Every day | ORAL | Status: DC
  Administered 2018-07-05: 13:00:00 1 via ORAL
  Filled 2018-07-04: qty 1

## 2018-07-04 MED ORDER — FENTANYL 2.5 MCG/ML W/ROPIVACAINE 0.15% IN NS 100 ML EPIDURAL (ARMC)
EPIDURAL | Status: AC
  Filled 2018-07-04: qty 100

## 2018-07-04 MED ORDER — ONDANSETRON HCL 4 MG PO TABS: 4.0000 mg | ORAL_TABLET | ORAL | Status: DC | PRN

## 2018-07-04 MED ORDER — FENTANYL 2.5 MCG/ML W/ROPIVACAINE 0.15% IN NS 100 ML EPIDURAL (ARMC)
12.0000 mL/h | EPIDURAL | Status: DC
  Administered 2018-07-04: 12 mL/h via EPIDURAL

## 2018-07-04 MED ORDER — ACETAMINOPHEN 325 MG PO TABS: 650.0000 mg | ORAL_TABLET | ORAL | Status: DC | PRN

## 2018-07-04 MED ORDER — BENZOCAINE-MENTHOL 20-0.5 % EX AERO: 1.0000 "application " | INHALATION_SPRAY | CUTANEOUS | Status: DC | PRN

## 2018-07-04 MED ORDER — LIDOCAINE HCL (PF) 1 % IJ SOLN
INTRAMUSCULAR | Status: DC | PRN
  Administered 2018-07-04: 3 mL via SUBCUTANEOUS

## 2018-07-04 MED ORDER — SODIUM CHLORIDE 0.9% FLUSH: 3.0000 mL | Freq: Two times a day (BID) | INTRAVENOUS | Status: DC

## 2018-07-04 MED ORDER — IBUPROFEN 600 MG PO TABS
600.0000 mg | ORAL_TABLET | Freq: Four times a day (QID) | ORAL | Status: DC
  Administered 2018-07-04 – 2018-07-05 (×4): 600 mg via ORAL
  Filled 2018-07-04 (×4): qty 1

## 2018-07-04 MED ORDER — VARICELLA VIRUS VACCINE LIVE 1350 PFU/0.5ML IJ SUSR
0.5000 mL | Freq: Once | INTRAMUSCULAR | Status: AC
  Administered 2018-07-05: 0.5 mL via SUBCUTANEOUS
  Filled 2018-07-04 (×3): qty 0.5

## 2018-07-04 MED ORDER — SENNOSIDES-DOCUSATE SODIUM 8.6-50 MG PO TABS
2.0000 | ORAL_TABLET | ORAL | Status: DC
  Administered 2018-07-05: 2 via ORAL
  Filled 2018-07-04: qty 2

## 2018-07-04 MED ORDER — LACTATED RINGERS IV SOLN
500.0000 mL | Freq: Once | INTRAVENOUS | Status: AC
  Administered 2018-07-04: 08:00:00 250 mL via INTRAVENOUS

## 2018-07-04 MED ORDER — PHENYLEPHRINE 40 MCG/ML (10ML) SYRINGE FOR IV PUSH (FOR BLOOD PRESSURE SUPPORT)
80.0000 ug | PREFILLED_SYRINGE | INTRAVENOUS | Status: DC | PRN
  Filled 2018-07-04: qty 10

## 2018-07-04 MED ORDER — LABETALOL HCL 200 MG PO TABS
200.0000 mg | ORAL_TABLET | Freq: Three times a day (TID) | ORAL | Status: DC
  Administered 2018-07-04 – 2018-07-05 (×4): 200 mg via ORAL
  Filled 2018-07-04 (×2): qty 1
  Filled 2018-07-04: qty 2
  Filled 2018-07-04: qty 1

## 2018-07-04 MED ORDER — SODIUM CHLORIDE 0.9% FLUSH: 3.0000 mL | INTRAVENOUS | Status: DC | PRN

## 2018-07-04 MED ORDER — EPHEDRINE 5 MG/ML INJ
10.0000 mg | INTRAVENOUS | Status: DC | PRN
  Filled 2018-07-04: qty 2

## 2018-07-04 MED ORDER — DIPHENHYDRAMINE HCL 50 MG/ML IJ SOLN: 12.5000 mg | INTRAMUSCULAR | Status: DC | PRN

## 2018-07-04 MED ORDER — BUPIVACAINE HCL (PF) 0.25 % IJ SOLN
INTRAMUSCULAR | Status: DC | PRN
  Administered 2018-07-04 (×2): 4 mL via EPIDURAL

## 2018-07-04 MED ORDER — LIDOCAINE-EPINEPHRINE (PF) 1.5 %-1:200000 IJ SOLN
INTRAMUSCULAR | Status: DC | PRN
  Administered 2018-07-04: 3 mL via EPIDURAL

## 2018-07-04 MED ORDER — SIMETHICONE 80 MG PO CHEW: 80.0000 mg | CHEWABLE_TABLET | ORAL | Status: DC | PRN

## 2018-07-04 MED ORDER — ZOLPIDEM TARTRATE 5 MG PO TABS: 5.0000 mg | ORAL_TABLET | Freq: Every evening | ORAL | Status: DC | PRN

## 2018-07-04 NOTE — Discharge Instructions (Signed)
Vaginal Delivery, Care After °Refer to this sheet in the next few weeks. These instructions provide you with information about caring for yourself after vaginal delivery. Your health care provider may also give you more specific instructions. Your treatment has been planned according to current medical practices, but problems sometimes occur. Call your health care provider if you have any problems or questions. °What can I expect after the procedure? °After vaginal delivery, it is common to have: °· Some bleeding from your vagina. °· Soreness in your abdomen, your vagina, and the area of skin between your vaginal opening and your anus (perineum). °· Pelvic cramps. °· Fatigue. °Follow these instructions at home: °Medicines °· Take over-the-counter and prescription medicines only as told by your health care provider. °· If you were prescribed an antibiotic medicine, take it as told by your health care provider. Do not stop taking the antibiotic until it is finished. °Driving ° °· Do not drive or operate heavy machinery while taking prescription pain medicine. °· Do not drive for 24 hours if you received a sedative. °Lifestyle °· Do not drink alcohol. This is especially important if you are breastfeeding or taking medicine to relieve pain. °· Do not use tobacco products, including cigarettes, chewing tobacco, or e-cigarettes. If you need help quitting, ask your health care provider. °Eating and drinking °· Drink at least 8 eight-ounce glasses of water every day unless you are told not to by your health care provider. If you choose to breastfeed your baby, you may need to drink more water than this. °· Eat high-fiber foods every day. These foods may help prevent or relieve constipation. High-fiber foods include: °? Whole grain cereals and breads. °? Brown rice. °? Beans. °? Fresh fruits and vegetables. °Activity °· Return to your normal activities as told by your health care provider. Ask your health care provider what  activities are safe for you. °· Rest as much as possible. Try to rest or take a nap when your baby is sleeping. °· Do not lift anything that is heavier than your baby or 10 lb (4.5 kg) until your health care provider says that it is safe. °· Talk with your health care provider about when you can engage in sexual activity. This may depend on your: °? Risk of infection. °? Rate of healing. °? Comfort and desire to engage in sexual activity. °Vaginal Care °· If you have an episiotomy or a vaginal tear, check the area every day for signs of infection. Check for: °? More redness, swelling, or pain. °? More fluid or blood. °? Warmth. °? Pus or a bad smell. °· Do not use tampons or douches until your health care provider says this is safe. °· Watch for any blood clots that may pass from your vagina. These may look like clumps of dark red, brown, or black discharge. °General instructions °· Keep your perineum clean and dry as told by your health care provider. °· Wear loose, comfortable clothing. °· Wipe from front to back when you use the toilet. °· Ask your health care provider if you can shower or take a bath. If you had an episiotomy or a perineal tear during labor and delivery, your health care provider may tell you not to take baths for a certain length of time. °· Wear a bra that supports your breasts and fits you well. °· If possible, have someone help you with household activities and help care for your baby for at least a few days after you   leave the hospital. °· Keep all follow-up visits for you and your baby as told by your health care provider. This is important. °Contact a health care provider if: °· You have: °? Vaginal discharge that has a bad smell. °? Difficulty urinating. °? Pain when urinating. °? A sudden increase or decrease in the frequency of your bowel movements. °? More redness, swelling, or pain around your episiotomy or vaginal tear. °? More fluid or blood coming from your episiotomy or vaginal  tear. °? Pus or a bad smell coming from your episiotomy or vaginal tear. °? A fever. °? A rash. °? Little or no interest in activities you used to enjoy. °? Questions about caring for yourself or your baby. °· Your episiotomy or vaginal tear feels warm to the touch. °· Your episiotomy or vaginal tear is separating or does not appear to be healing. °· Your breasts are painful, hard, or turn red. °· You feel unusually sad or worried. °· You feel nauseous or you vomit. °· You pass large blood clots from your vagina. If you pass a blood clot from your vagina, save it to show to your health care provider. Do not flush blood clots down the toilet without having your health care provider look at them. °· You urinate more than usual. °· You are dizzy or light-headed. °· You have not breastfed at all and you have not had a menstrual period for 12 weeks after delivery. °· You have stopped breastfeeding and you have not had a menstrual period for 12 weeks after you stopped breastfeeding. °Get help right away if: °· You have: °? Pain that does not go away or does not get better with medicine. °? Chest pain. °? Difficulty breathing. °? Blurred vision or spots in your vision. °? Thoughts about hurting yourself or your baby. °· You develop pain in your abdomen or in one of your legs. °· You develop a severe headache. °· You faint. °· You bleed from your vagina so much that you fill two sanitary pads in one hour. °This information is not intended to replace advice given to you by your health care provider. Make sure you discuss any questions you have with your health care provider. °Document Released: 03/11/2000 Document Revised: 08/26/2015 Document Reviewed: 03/29/2015 °Elsevier Interactive Patient Education © 2019 [employer] ° °

## 2018-07-04 NOTE — Discharge Summary (Signed)
OB Discharge Summary     Patient Name: Angela Weaver DOB: April 14, 1985 MRN: [id number]  Date of admission: 07/03/2018 Delivering MD: Letitia Libra, MD  Date of Delivery: 07/04/2018  Date of discharge: 07/05/2018  Admitting diagnosis: labor and delivery Intrauterine pregnancy: [redacted]w[redacted]d     Secondary diagnosis: Chronic Hypertension     Discharge diagnosis: Term Pregnancy Delivered and Gestational Hypertension, No other diagnosis                         Hospital course:  Induction of Labor With Vaginal Delivery   33 y.o. yo [id number] at [redacted]w[redacted]d was admitted to the hospital 07/03/2018 for induction of labor.  Indication for induction: Chronic Hypertension at term.  Patient had an uncomplicated labor course as follows: Membrane Rupture Time/Date: 8:49 AM ,07/04/2018   Intrapartum Procedures: Episiotomy: None [1]                                         Lacerations:     Patient had delivery of a Viable infant.  Information for the patient's newborn:  Mykeisha, Cranmer [[id number]]  Delivery Method: Vag-Spont   07/04/2018  Details of delivery can be found in separate delivery note.  Patient had a routine postpartum course. Patient is discharged home 07/05/18.                                                                 Post partum procedures:none  Complications: None  Physical exam on 07/05/2018: Vitals:   07/04/18 1643 07/04/18 2042 07/05/18 0030 07/05/18 0420  BP: 126/72 118/80 119/86 117/79  Pulse: 86 90 78 75  Resp: 16 18 20 18   Temp: 98.6 F (37 C)  97.9 F (36.6 C) 97.9 F (36.6 C)  TempSrc: Oral  Oral Oral  SpO2: 98% 98% 98% 98%  Weight:      Height:       General: alert Lochia: appropriate Uterine Fundus: firm Incision:None DVT Evaluation: No evidence of DVT seen on physical exam. Negative Homan's sign.  Labs: Lab Results  Component Value Date   WBC 7.6 07/05/2018   HGB 8.5 (L) 07/05/2018   HCT 25.7 (L) 07/05/2018   MCV 88.6 07/05/2018   PLT 141 (L)  07/05/2018   CMP Latest Ref Rng & Units 07/03/2018  Glucose 70 - 99 mg/dL 981(S)  BUN 6 - 20 mg/dL 6  Creatinine 2.54 - 8.62 mg/dL 8.24  Sodium 175 - 301 mmol/L 136  Potassium 3.5 - 5.1 mmol/L 3.2(L)  Chloride 98 - 111 mmol/L 108  CO2 22 - 32 mmol/L 19(L)  Calcium 8.9 - 10.3 mg/dL 8.2(L)  Total Protein 6.5 - 8.1 g/dL 5.9(L)  Total Bilirubin 0.3 - 1.2 mg/dL 0.3  Alkaline Phos 38 - 126 U/L 127(H)  AST 15 - 41 U/L 18  ALT 0 - 44 U/L 9    Discharge instruction: per After Visit Summary.  Medications:  Allergies as of 07/05/2018      Reactions   Coconut Flavor Hives, Itching   Sulfa Antibiotics Hives, Other (See Comments)   Told by mother    Coconut Oil Rash   Codeine Rash,  Other (See Comments)   Been told by mother    Codone [hydrocodone] Anxiety      Medication List    TAKE these medications   labetalol 200 MG tablet Commonly known as:  NORMODYNE Take 1 tablet (200 mg total) by mouth 2 (two) times daily. What changed:  when to take this   prenatal vitamin w/FE, FA 27-1 MG Tabs tablet Take 1 tablet by mouth every evening.       Diet: routine diet  Activity: Advance as tolerated. Pelvic rest for 6 weeks.   Outpatient follow up: Follow-up Information    Nadara MustardHarris, Keya Wynes P, MD. Schedule an appointment as soon as possible for a visit in 6 week(s).   Specialty:  Obstetrics and Gynecology Contact information: [address] [place] [address] [phone]         Postpartum contraception: Plans vasectomy; will use other measures until able to be done (OCPs) Rhogam Given postpartum: no Rubella vaccine given postpartum: no Varicella vaccine given postpartum: yes TDaP given antepartum or postpartum: Yes  Newborn Data: Live born female  Birth Weight: 6 lb 12.3 oz (3070 g) APGAR: 8, 9  Newborn Delivery   Birth date/time:  07/04/2018 11:58:00 Delivery type:  Vaginal, Spontaneous    Baby Feeding: Breast  Disposition:home with mother  SIGNED: Letitia Libraobert Paul  Emerita Berkemeier, MD 07/05/2018 7:42 AM

## 2018-07-04 NOTE — Lactation Note (Signed)
This note was copied from a baby's chart. Lactation Consultation Note  Patient Name: Angela Weaver OEVOJ'[id number] Date: 07/04/2018     Maternal Data    Feeding Feeding Type: Breast Fed  LATCH Score                   Interventions    Lactation Tools Discussed/Used     Consult Status  LC talked with mother about how breastfeeding is progressing. Mother states that infant is doing very well and is latching well with no discomfort. Mother states that she knew how to hand express and is aware of what to expect in the first 24 hours. Mother denies any concerns at the moment.    Arlyss Gandy 07/04/2018, 4:31 PM

## 2018-07-04 NOTE — Anesthesia Procedure Notes (Signed)
Epidural Patient location during procedure: OB Start time: 07/04/2018 8:15 AM End time: 07/04/2018 8:20 AM  Staffing Anesthesiologist: Berdine Addison, MD Resident/CRNA: Karoline Caldwell, CRNA Performed: resident/CRNA   Preanesthetic Checklist Completed: patient identified, site marked, surgical consent, pre-op evaluation, timeout performed, IV checked, risks and benefits discussed and monitors and equipment checked  Epidural Patient position: sitting Prep: ChloraPrep Patient monitoring: heart rate, continuous pulse ox and blood pressure Approach: midline Location: L3-L4 Injection technique: LOR air  Needle:  Needle type: Tuohy  Needle gauge: 17 G Needle length: 9 cm and 9 Needle insertion depth: 6 cm Catheter type: closed end flexible Catheter size: 19 Gauge Catheter at skin depth: 11 cm Test dose: negative and 1.5% lidocaine with Epi 1:200 K  Assessment Events: blood not aspirated, injection not painful, no injection resistance, negative IV test and no paresthesia  Additional Notes 1 attempt Pt. Evaluated and documentation done after procedure finished. Patient identified. Risks/Benefits/Options discussed with patient including but not limited to bleeding, infection, nerve damage, paralysis, failed block, incomplete pain control, headache, blood pressure changes, nausea, vomiting, reactions to medication both or allergic, itching and postpartum back pain. Confirmed with bedside nurse the patient's most recent platelet count. Confirmed with patient that they are not currently taking any anticoagulation, have any bleeding history or any family history of bleeding disorders. Patient expressed understanding and wished to proceed. All questions were answered. Sterile technique was used throughout the entire procedure. Please see nursing notes for vital signs. Test dose was given through epidural catheter and negative prior to continuing to dose epidural or start infusion. Warning signs of  high block given to the patient including shortness of breath, tingling/numbness in hands, complete motor block, or any concerning symptoms with instructions to call for help. Patient was given instructions on fall risk and not to get out of bed. All questions and concerns addressed with instructions to call with any issues or inadequate analgesia.   Patient tolerated the insertion well without immediate complications.Reason for block:procedure for pain

## 2018-07-04 NOTE — Progress Notes (Signed)
  Labor Progress Note   33 y.o. [id number] @ [redacted]w[redacted]d , admitted for  Pregnancy, Labor Management.   Subjective:  IOL for gest HTN, BPs under good control, denies ha, blurry vision, CP, SOB, epigastric pain, edema Mod pain w ctxs, on Pitocin 16 mU/min  Objective:  BP (!) 154/94   Pulse 80   Temp 98 F (36.7 C) (Oral)   Resp 20   Ht 5\' 6"  (1.676 m)   Wt 81.6 kg   LMP 10/01/2017   BMI 29.05 kg/m  Abd: gravid, ND, FHT present, moderate tenderness on exam Extr: trace to 1+ bilateral pedal edema SVE: CERVIX: 2 cm dilated, 80 effaced, -3 station, presenting part VTX MEMBRANES: intact  EFM: FHR: 140 bpm, variability: moderate,  accelerations:  Present,  decelerations:  Absent Toco: Frequency: Every 2-3 minutes  Assessment & Plan:  [id number] @ [redacted]w[redacted]d, admitted for Pregnancy and Labor/Delivery Management due to gestational HTN and term pregnancy  1. Pain management: IV sedation. 2. FWB: FHT category 1.  3. ID: GBS negative 4. Labor management: Plan epidural when next needs something for pain. She will continue birthing ball and positioning to help pattern. AROM next step. Cont Pitocin.  All discussed with patient, see orders  Annamarie Major, MD, [facility], [facility] 07/04/2018  6:53 AM

## 2018-07-04 NOTE — Progress Notes (Signed)
  Labor Progress Note   33 y.o. L2X5170 @ [redacted]w[redacted]d , admitted for  Pregnancy, Labor Management.   Subjective:  More comfortable after epidural  Objective:  BP (!) 158/97   Pulse 88   Temp 97.6 F (36.4 C) (Oral)   Resp 16   Ht 5\' 6"  (1.676 m)   Wt 81.6 kg   LMP 10/01/2017   SpO2 100%   BMI 29.05 kg/m  Abd: gravid, ND, FHT present, mild tenderness on exam Extr: trace to 1+ bilateral pedal edema SVE: CERVIX: 4 cm dilated, 90 effaced, -2 station, presenting part VTX MEMBRANES: ruptured, clear fluid  EFM: FHR: 140 bpm, variability: moderate,  accelerations:  Present,  decelerations:  Absent Toco: Frequency: Every 2-4 minutes  Assessment & Plan:  Y1V4944 @ [redacted]w[redacted]d, admitted for  Pregnancy and Labor/Delivery Management w IOL due to Gestational HTN  1. Pain management: epidural. 2. FWB: FHT category 1.  3. ID: GBS negative 4. Labor management: Pitocin 16 mU/min AROM clear Epidural effective Improved cervical exam, labor curve Cont plan Labetalol for elevated BPs  All discussed with patient, see orders  Annamarie Major, MD, [facility], [facility] 07/04/2018  8:54 AM

## 2018-07-04 NOTE — Anesthesia Preprocedure Evaluation (Signed)
Anesthesia Evaluation  Patient identified by MRN, date of birth, ID band Patient awake    Reviewed: Allergy & Precautions, H&P , NPO status , Patient's Chart, lab work & pertinent test results  Airway Mallampati: II  TM Distance: >3 FB Neck ROM: full    Dental  (+) Teeth Intact   Pulmonary former smoker,    Pulmonary exam normal        Cardiovascular hypertension, On Medications Normal cardiovascular exam+ Valvular Problems/Murmurs      Neuro/Psych  Headaches,    GI/Hepatic GERD  Medicated,  Endo/Other    Renal/GU      Musculoskeletal   Abdominal   Peds  Hematology  (+) Blood dyscrasia, anemia ,   Anesthesia Other Findings   Reproductive/Obstetrics (+) Pregnancy                             Anesthesia Physical Anesthesia Plan  ASA: II  Anesthesia Plan: Epidural   Post-op Pain Management:    Induction:   PONV Risk Score and Plan:   Airway Management Planned:   Additional Equipment:   Intra-op Plan:   Post-operative Plan:   Informed Consent: I have reviewed the patients History and Physical, chart, labs and discussed the procedure including the risks, benefits and alternatives for the proposed anesthesia with the patient or authorized representative who has indicated his/her understanding and acceptance.     Dental Advisory Given  Plan Discussed with: Anesthesiologist and CRNA  Anesthesia Plan Comments:         Anesthesia Quick Evaluation

## 2018-07-04 NOTE — Progress Notes (Signed)
  Labor Progress Note   33 y.o. B1D1761 @ [redacted]w[redacted]d , admitted for  Pregnancy, Labor Management.   Subjective:  Painful ctxs  Objective:  BP (!) 140/94   Pulse 86   Temp 98.4 F (36.9 C) (Oral)   Resp 16   Ht 5\' 6"  (1.676 m)   Wt 81.6 kg   LMP 10/01/2017   BMI 29.05 kg/m  Abd: gravid, ND, FHT present, moderate tenderness on exam Extr: trace to 1+ bilateral pedal edema SVE: EXTERNAL GENITALIA: normal appearing vulva with no masses, tenderness or lesions CERVIX: 1 cm dilated, 70 effaced, -3 station, cervical position mid, presenting part Vtx VAGINA: no abnormalities noted MEMBRANES: intact  EFM: FHR: 140 bpm, variability: moderate,  accelerations:  Present,  decelerations:  Absent Toco: Frequency: Every 2-3 minutes Labs: I have reviewed the patient's lab results. Pitocin at 16 mU/min  Assessment & Plan:  Y0V3710 @ [redacted]w[redacted]d, admitted for  Pregnancy and Labor/Delivery Management  1. Pain management: IV sedation. 2. FWB: FHT category 1.  3. ID: GBS negative 4. Labor management: Cont Pitocin, awaiting descent.  Position changes,  All discussed with patient, see orders  Annamarie Major, MD, [facility], [facility] 07/04/2018  3:05 AM

## 2018-07-05 ENCOUNTER — Encounter: Payer: BLUE CROSS/BLUE SHIELD | Admitting: Obstetrics & Gynecology

## 2018-07-05 ENCOUNTER — Other Ambulatory Visit: Payer: BLUE CROSS/BLUE SHIELD

## 2018-07-05 LAB — CBC
HCT: 25.7 % — ABNORMAL LOW (ref 36.0–46.0)
Hemoglobin: 8.5 g/dL — ABNORMAL LOW (ref 12.0–15.0)
MCH: 29.3 pg (ref 26.0–34.0)
MCHC: 33.1 g/dL (ref 30.0–36.0)
MCV: 88.6 fL (ref 80.0–100.0)
Platelets: 141 10*3/uL — ABNORMAL LOW (ref 150–400)
RBC: 2.9 MIL/uL — ABNORMAL LOW (ref 3.87–5.11)
RDW: 14 % (ref 11.5–15.5)
WBC: 7.6 10*3/uL (ref 4.0–10.5)
nRBC: 0 % (ref 0.0–0.2)

## 2018-07-05 MED ORDER — LABETALOL HCL 200 MG PO TABS: 200.0000 mg | ORAL_TABLET | Freq: Two times a day (BID) | ORAL | 6 refills | Status: DC

## 2018-07-05 NOTE — Progress Notes (Signed)
Patient discharged home with infant. Discharge instructions, prescriptions and follow up appointment given to and reviewed with patient. Patient verbalized understanding. Patient wheeled out by NT.

## 2018-07-05 NOTE — Anesthesia Postprocedure Evaluation (Signed)
Anesthesia Post Note  Patient: Angela Weaver  Procedure(s) Performed: AN AD HOC LABOR EPIDURAL  Patient location during evaluation: Mother Baby Anesthesia Type: Epidural Level of consciousness: awake and alert Pain management: pain level controlled Vital Signs Assessment: post-procedure vital signs reviewed and stable Respiratory status: spontaneous breathing, nonlabored ventilation and respiratory function stable Cardiovascular status: stable Postop Assessment: no headache, no backache and epidural receding Anesthetic complications: no     Last Vitals:  Vitals:   07/05/18 0420 07/05/18 0828  BP: 117/79 130/82  Pulse: 75 79  Resp: 18 20  Temp: 36.6 C 36.8 C  SpO2: 98% 100%    Last Pain:  Vitals:   07/05/18 0828  TempSrc: Oral  PainSc:                  Karoline Caldwell

## 2018-08-16 ENCOUNTER — Other Ambulatory Visit (HOSPITAL_COMMUNITY)
Admission: RE | Admit: 2018-08-16 | Discharge: 2018-08-16 | Disposition: A | Discharge status: Home / Self Care | Payer: BLUE CROSS/BLUE SHIELD | Source: Ambulatory Visit | Attending: Obstetrics & Gynecology | Admitting: Obstetrics & Gynecology

## 2018-08-16 ENCOUNTER — Other Ambulatory Visit: Payer: Self-pay

## 2018-08-16 ENCOUNTER — Encounter: Payer: Self-pay | Admitting: Obstetrics & Gynecology

## 2018-08-16 ENCOUNTER — Ambulatory Visit (INDEPENDENT_AMBULATORY_CARE_PROVIDER_SITE_OTHER): Payer: BLUE CROSS/BLUE SHIELD | Admitting: Obstetrics & Gynecology

## 2018-08-16 DIAGNOSIS — O10919 Unspecified pre-existing hypertension complicating pregnancy, unspecified trimester: Secondary | ICD-10-CM

## 2018-08-16 DIAGNOSIS — Z124 Encounter for screening for malignant neoplasm of cervix: Secondary | ICD-10-CM | POA: Insufficient documentation

## 2018-08-16 DIAGNOSIS — Z1389 Encounter for screening for other disorder: Secondary | ICD-10-CM | POA: Diagnosis not present

## 2018-08-16 MED ORDER — NORETHINDRONE 0.35 MG PO TABS: 1.0000 | ORAL_TABLET | Freq: Every day | ORAL | 11 refills | Status: DC

## 2018-08-16 NOTE — Progress Notes (Signed)
  OBSTETRICS POSTPARTUM CLINIC PROGRESS NOTE  Subjective:     Angela Weaver is a 33 y.o. [phone] female who presents for a postpartum visit. She is 6 weeks postpartum following a Term pregnancy complicated by [facility] and delivery by Vaginal, no problems at delivery.  I have fully reviewed the prenatal and intrapartum course. Anesthesia: epidural.  Postpartum course has been complicated by uncomplicated.  Baby is feeding by Breast.  Bleeding: patient has not  resumed menses.  Bowel function is normal. Bladder function is normal.  Patient is not sexually active. Contraception method desired is oral progesterone-only contraceptive and vasectomy.  Postpartum depression screening: negative. Edinburgh 8. Anxiety  The following portions of the patient's history were reviewed and updated as appropriate: allergies, current medications, past family history, past medical history, past social history, past surgical history and problem list.  Review of Systems Pertinent items are noted in HPI.  Objective:    BP 130/90   Ht 5\' 5"  (1.651 m)   Wt 168 lb (76.2 kg)   BMI 27.96 kg/m   General:  alert and no distress   Breasts:  inspection negative, no nipple discharge or bleeding, no masses or nodularity palpable  Lungs: clear to auscultation bilaterally  Heart:  regular rate and rhythm, S1, S2 normal, no murmur, click, rub or gallop  Abdomen: soft, non-tender; bowel sounds normal; no masses,  no organomegaly.     Vulva:  normal  Vagina: normal vagina, no discharge, exudate, lesion, or erythema  Cervix:  no cervical motion tenderness and no lesions  Corpus: normal size, contour, position, consistency, mobility, non-tender  Adnexa:  normal adnexa and no mass, fullness, tenderness  Rectal Exam: Not performed.          Assessment:  Post Partum Care visit 1. Postpartum care and examination  2. Screening for cervical cancer - Cytology - PAP  3. Pre-existing hypertension during pregnancy,  antepartum, unspecified pre-existing hypertension type - Ambulatory referral to Family Practice - Cont Labetalol for now  Plan:  See orders and Patient Instructions Contraceptive counseling for vasectomy when he is able to get scheduled; Nor QD daily  until then, pros andcons disucssed Resume all normal activities Follow up in: 12 months or as needed.   Annamarie Major, MD, [facility], [facility] 08/16/2018  3:47 PM

## 2018-08-22 ENCOUNTER — Ambulatory Visit (INDEPENDENT_AMBULATORY_CARE_PROVIDER_SITE_OTHER): Payer: BLUE CROSS/BLUE SHIELD | Admitting: Family Medicine

## 2018-08-22 ENCOUNTER — Encounter: Payer: Self-pay | Admitting: Family Medicine

## 2018-08-22 DIAGNOSIS — G43109 Migraine with aura, not intractable, without status migrainosus: Secondary | ICD-10-CM

## 2018-08-22 DIAGNOSIS — I10 Essential (primary) hypertension: Secondary | ICD-10-CM

## 2018-08-22 DIAGNOSIS — Z8742 Personal history of other diseases of the female genital tract: Secondary | ICD-10-CM

## 2018-08-22 LAB — CYTOLOGY - PAP
Diagnosis: NEGATIVE
HPV: NOT DETECTED

## 2018-08-22 MED ORDER — AMLODIPINE BESYLATE 5 MG PO TABS: 5.0000 mg | ORAL_TABLET | Freq: Every day | ORAL | 3 refills | Status: DC

## 2018-08-22 NOTE — Patient Instructions (Addendum)
Stop labetalol Start amlodipine 5mg  daily

## 2018-08-22 NOTE — Progress Notes (Signed)
Patient: Angela Weaver, Female    DOB: December 06, 1985, 33 y.o.   MRN: [id number] Visit Date: 08/22/2018  Today's Provider: Shirlee Latch, MD   Chief Complaint  Patient presents with  . New Patient (Initial Visit)   Subjective:   Virtual Visit via Video Note  I connected with Angela Weaver on 08/23/18 at 10:00 AM EDT by a video enabled telemedicine application and verified that I am speaking with the correct person using two identifiers.   Patient location: home Provider location: [facility] Persons involved in the visit: patient, provider     I discussed the limitations of evaluation and management by telemedicine and the availability of in person appointments. The patient expressed understanding and agreed to proceed.     New Patient Appointment: Angela Weaver is a 33 y.o. female who presents today for health maintenance and complete physical. She feels well. She reports exercising walking. She reports she is sleeping fairly well.  ----------------------------------------------------------------- Last pap:08/16/2018 - has h/o abnormal pap (LGSIL) - followed by GYN On POPs - to start this week  Migraines: Have mostly subsided.  Seemed to have them more with her menstrual cycle.  Rare now.  Not on prophylaxis.  Tylenol prn if develops a migraine.  +aura with floaters  Hypertension: 7 weeks postpartum.  She is breastfeeding.  Currently taking labetalol with good compliance. Home BPs in 130s/80s.  - Denies any SOB, CP, vision changes, LE edema, medication SEs, or symptoms of hypotension  Previously took metoprolol and lisinopril which worked well.  Has never tried any other antihypertensives    Review of Systems  Constitutional: Negative.   HENT: Negative.   Eyes: Negative.   Respiratory: Negative.   Cardiovascular: Negative.   Gastrointestinal: Negative.   Genitourinary: Negative.   Musculoskeletal: Negative.   Skin: Negative.    Allergic/Immunologic: Negative.   Neurological: Negative.   Hematological: Negative.   Psychiatric/Behavioral: Negative.     Social History She  reports that she quit smoking about 7 years ago. Her smoking use included cigarettes. She has a 0.50 pack-year smoking history. She has never used smokeless tobacco. She reports that she does not drink alcohol or use drugs. Social History   Socioeconomic History  . Marital status: Married    Spouse name: Not on file  . Number of children: Not on file  . Years of education: Not on file  . Highest education level: Not on file  Occupational History  . Not on file  Social Needs  . Financial resource strain: Not on file  . Food insecurity:    Worry: Not on file    Inability: Not on file  . Transportation needs:    Medical: Not on file    Non-medical: Not on file  Tobacco Use  . Smoking status: Former Smoker    Packs/day: 0.25    Years: 2.00    Pack years: 0.50    Types: Cigarettes    Last attempt to quit: 04/26/2011    Years since quitting: 7.3  . Smokeless tobacco: Never Used  . Tobacco comment: more socially   Substance and Sexual Activity  . Alcohol use: No  . Drug use: No  . Sexual activity: Not Currently    Birth control/protection: None  Lifestyle  . Physical activity:    Days per week: Not on file    Minutes per session: Not on file  . Stress: Not on file  Relationships  . Social connections:  Talks on phone: Not on file    Gets together: Not on file    Attends religious service: Not on file    Active member of club or organization: Not on file    Attends meetings of clubs or organizations: Not on file    Relationship status: Not on file  Other Topics Concern  . Not on file  Social History Narrative  . Not on file    Patient Active Problem List   Diagnosis Date Noted  . Encounter for elective induction of labor 07/04/2018  . Chronic hypertension affecting pregnancy 07/03/2018  . High-risk pregnancy, third  trimester 06/22/2018  . Hypertension affecting pregnancy 06/11/2018  . Supervision of normal pregnancy 05/14/2018  . Pre-existing hypertension during pregnancy, antepartum 01/26/2018  . Supervision of low-risk pregnancy, first trimester 12/29/2017  . Missed abortion 04/27/2017  . Hypertension affecting pregnancy in third trimester 03/15/2016    Past Surgical History:  Procedure Laterality Date  . DILATION AND EVACUATION  04/27/2017   Procedure: DILATATION AND EVACUATION;  Surgeon: Nadara MustardHarris, Robert P, MD;  Location: [facility] ORS;  Service: Gynecology;;  . KNEE SURGERY    . TONSILLECTOMY    . WISDOM TOOTH EXTRACTION      Family History  Family Status  Relation Name Status  . Mat Aunt  (Not Specified)  . MGM  (Not Specified)  . MGF  (Not Specified)  . Mother  Alive  . Father  Alive   Her family history includes Diabetes in her maternal grandfather; Melanoma (age of onset: 5350) in her maternal aunt; Melanoma (age of onset: 5972) in her maternal grandmother.     Allergies  Allergen Reactions  . Coconut Flavor Hives and Itching  . Sulfa Antibiotics Hives and Other (See Comments)    Told by mother    . Coconut Oil Rash  . Codeine Rash and Other (See Comments)    Been told by mother    . Codone [Hydrocodone] Anxiety    Previous Medications   LABETALOL (NORMODYNE) 200 MG TABLET    Take 1 tablet (200 mg total) by mouth 2 (two) times daily.   NORETHINDRONE (MICRONOR) 0.35 MG TABLET    Take 1 tablet (0.35 mg total) by mouth daily.   PRENATAL VITAMIN W/FE, FA (PRENATAL 1 + 1) 27-1 MG TABS TABLET    Take 1 tablet by mouth every evening.     Patient Care Team: Erasmo DownerBacigalupo, Angela M, MD as PCP - General (Family Medicine)      Objective:   Vitals: There were no vitals taken for this visit.   Physical Exam Constitutional:      Appearance: Normal appearance.  Pulmonary:     Effort: Pulmonary effort is normal. No respiratory distress.  Neurological:     Mental Status: She is alert and  oriented to person, place, and time.  Psychiatric:        Mood and Affect: Mood normal.        Behavior: Behavior normal.     Depression Screen PHQ 2/9 Scores 08/22/2018  PHQ - 2 Score 1  PHQ- 9 Score 1    Assessment & Plan:   Follow Up Instructions: I discussed the assessment and treatment plan with the patient. The patient was provided an opportunity to ask questions and all were answered. The patient agreed with the plan and demonstrated an understanding of the instructions.   The patient was advised to call back or seek an in-person evaluation if the symptoms worsen or if the condition  fails to improve as anticipated.   Establish care  Exercise Activities and Dietary recommendations Goals   None     Immunization History  Administered Date(s) Administered  . Influenza,inj,Quad PF,6+ Mos 12/29/2017  . Tdap 05/14/2018  . Varicella 03/18/2016, 07/05/2018    Health Maintenance  Topic Date Due  . INFLUENZA VACCINE  10/27/2018  . PAP SMEAR-Modifier  03/30/2020  . TETANUS/TDAP  05/14/2028  . HIV Screening  Completed     Discussed health benefits of physical activity, and encouraged her to engage in regular exercise appropriate for her age and condition.    --------------------------------------------------------------------  Problem List Items Addressed This Visit      Cardiovascular and Mediastinum   Hypertension - Primary    Patient reports she is currently well controlled on labetalol She does get headaches from labetalol and would like to switch off of this She is 7 weeks postpartum and breast-feeding We agreed to start amlodipine 5 mg daily At follow-up, reassess blood pressure control and consider dose titration Patient to continue to monitor home blood pressures Screening lipid panel at next in-person visit      Relevant Medications   amLODipine (NORVASC) 5 MG tablet   Migraine with aura and without status migrainosus, not intractable    Chronic and  well-controlled Rare migraine symptoms We will continue to monitor      Relevant Medications   amLODipine (NORVASC) 5 MG tablet     Other   History of abnormal cervical Pap smear    Followed by GYN          Return in about 2 weeks (around 09/05/2018) for BP f/u.   The entirety of the information documented in the History of Present Illness, Review of Systems and Physical Exam were personally obtained by me. Portions of this information were initially documented by [facility], CMA and reviewed by me for thoroughness and accuracy.    Bacigalupo, Marzella Schlein, MD MPH [facility]

## 2018-08-23 DIAGNOSIS — G43109 Migraine with aura, not intractable, without status migrainosus: Secondary | ICD-10-CM | POA: Insufficient documentation

## 2018-08-23 DIAGNOSIS — Z8742 Personal history of other diseases of the female genital tract: Secondary | ICD-10-CM | POA: Insufficient documentation

## 2018-08-23 NOTE — Assessment & Plan Note (Signed)
Patient reports she is currently well controlled on labetalol She does get headaches from labetalol and would like to switch off of this She is 7 weeks postpartum and breast-feeding We agreed to start amlodipine 5 mg daily At follow-up, reassess blood pressure control and consider dose titration Patient to continue to monitor home blood pressures Screening lipid panel at next in-person visit

## 2018-08-23 NOTE — Assessment & Plan Note (Signed)
Followed by GYN

## 2018-08-23 NOTE — Assessment & Plan Note (Signed)
Chronic and well-controlled Rare migraine symptoms We will continue to monitor

## 2018-09-05 ENCOUNTER — Ambulatory Visit (INDEPENDENT_AMBULATORY_CARE_PROVIDER_SITE_OTHER): Payer: BLUE CROSS/BLUE SHIELD | Admitting: Family Medicine

## 2018-09-05 ENCOUNTER — Encounter: Payer: Self-pay | Admitting: Family Medicine

## 2018-09-05 VITALS — BP 130/85

## 2018-09-05 DIAGNOSIS — I10 Essential (primary) hypertension: Secondary | ICD-10-CM

## 2018-09-05 DIAGNOSIS — F418 Other specified anxiety disorders: Secondary | ICD-10-CM | POA: Diagnosis not present

## 2018-09-05 DIAGNOSIS — O99345 Other mental disorders complicating the puerperium: Secondary | ICD-10-CM

## 2018-09-05 MED ORDER — AMLODIPINE BESYLATE 10 MG PO TABS: 10.0000 mg | ORAL_TABLET | Freq: Every day | ORAL | 2 refills | Status: DC

## 2018-09-05 MED ORDER — SERTRALINE HCL 50 MG PO TABS: 50.0000 mg | ORAL_TABLET | Freq: Every day | ORAL | 3 refills | Status: DC

## 2018-09-05 NOTE — Assessment & Plan Note (Signed)
Well controlled, but discussed lower goal given that she is young Will increase amlodipine to 10mg  daily

## 2018-09-05 NOTE — Progress Notes (Signed)
Patient: Angela Weaver Female    DOB: 26-Feb-1986   33 y.o.   MRN: [id number] Visit Date: 09/05/2018  Today's Provider: Shirlee LatchAngela Bacigalupo, MD   Chief Complaint  Patient presents with  . Hypertension   Subjective:    Virtual Visit via Video Note  I connected with Angela BearEsther A Weaver on 09/05/18 at  8:40 AM EDT by a video enabled telemedicine application and verified that I am speaking with the correct person using two identifiers.   Patient location: home [facility] Persons involved in the visit: patient, provider   I discussed the limitations of evaluation and management by telemedicine and the availability of in person appointments. The patient expressed understanding and agreed to proceed.   HPI  Hypertension, follow-up:  BP Readings from Last 3 Encounters:  09/05/18 130/85  08/16/18 130/90  07/05/18 125/87    She was last seen for hypertension 2 weeks ago.  BP at that visit was 130/90. Management changes since that visit include started Amlodipine 5 mg. She reports good compliance with treatment. She is not having side effects.  She is exercising. She is adherent to low salt diet.   Outside blood pressures are being checked at home. She states BP readings are about the same. She is experiencing none.  Patient denies chest pain, chest pressure/discomfort, claudication, dyspnea, exertional chest pressure/discomfort, fatigue, irregular heart beat, lower extremity edema, near-syncope, orthopnea, palpitations, paroxysmal nocturnal dyspnea, syncope and tachypnea.   Cardiovascular risk factors include hypertension.  Use of agents associated with hypertension: none.     Weight trend: stable Wt Readings from Last 3 Encounters:  08/16/18 168 lb (76.2 kg)  07/03/18 180 lb (81.6 kg)  07/02/18 180 lb (81.6 kg)   ------------------------------------------------------------------------  Patient reports feeling very anxious and  tearful.  This has been present for several weeks.  Pecola LeisureBaby is almost 2 months old.  Never had PPD with other children (this is #4 for her).  She has never seen a therapist/psychiatrist or taken medication for anxiety or depression.  She is breastfeeding.  Dep[facility] 09/05/2018 08/22/2018  Decreased Interest 0 0  Down, Depressed, Hopeless 3 1  PHQ - 2 Score 3 1  Altered sleeping 3 0  Tired, decreased energy 3 0  Change in appetite 0 0  Feeling bad or failure about yourself  1 0  Trouble concentrating 2 0  Moving slowly or fidgety/restless 0 0  Suicidal thoughts 0 0  PHQ-9 Score 12 1  Difficult doing work/chores Somewhat difficult Not difficult at all    GAD 7 : Generalized Anxiety Score 09/05/2018  Nervous, Anxious, on Edge 3  Control/stop worrying 3  Worry too much - different things 3  Trouble relaxing 3  Restless 2  Easily annoyed or irritable 3  Afraid - awful might happen 3  Total GAD 7 Score 20  Anxiety Difficulty Very difficult     Edinburgh Postnatal Depression Scale - 09/05/18 0900      Edinburgh Postnatal Depression Scale:  In the Past 7 Days   I have been able to laugh and see the funny side of things.  1    I have looked forward with enjoyment to things.  2    I have blamed myself unnecessarily when things went wrong.  3    I have been anxious or worried for no good reason.  3    I have felt scared or panicky for no good reason.  3  Things have been getting on top of me.  2    I have been so unhappy that I have had difficulty sleeping.  3    I have felt sad or miserable.  1    I have been so unhappy that I have been crying.  3    The thought of harming myself has occurred to me.  0    Edinburgh Postnatal Depression Scale Total  21        Allergies  Allergen Reactions  . Coconut Flavor Hives and Itching  . Sulfa Antibiotics Hives and Other (See Comments)    Told by mother    . Coconut Oil Rash  . Codeine Rash and Other (See Comments)    Been  told by mother    . Codone [Hydrocodone] Anxiety     Current Outpatient Medications:  .  amLODipine (NORVASC) 5 MG tablet, Take 1 tablet (5 mg total) by mouth daily., Disp: 30 tablet, Rfl: 3 .  prenatal vitamin w/FE, FA (PRENATAL 1 + 1) 27-1 MG TABS tablet, Take 1 tablet by mouth every evening. , Disp: , Rfl:  .  norethindrone (MICRONOR) 0.35 MG tablet, Take 1 tablet (0.35 mg total) by mouth daily. (Patient not taking: Reported on 08/22/2018), Disp: 1 Package, Rfl: 11  Review of Systems  Constitutional: Negative.   Respiratory: Negative.   Cardiovascular: Negative.   Musculoskeletal: Negative.     Social History   Tobacco Use  . Smoking status: Former Smoker    Packs/day: 0.25    Years: 2.00    Pack years: 0.50    Types: Cigarettes    Last attempt to quit: 04/26/2011    Years since quitting: 7.3  . Smokeless tobacco: Never Used  . Tobacco comment: more socially   Substance Use Topics  . Alcohol use: No      Objective:   BP 130/85  Vitals:   09/05/18 0841  BP: 130/85     Physical Exam Constitutional:      Appearance: Normal appearance.  HENT:     Head: Normocephalic and atraumatic.  Pulmonary:     Effort: Pulmonary effort is normal. No respiratory distress.  Neurological:     Mental Status: She is alert and oriented to person, place, and time. Mental status is at baseline.  Psychiatric:        Attention and Perception: Attention normal.        Mood and Affect: Mood is anxious. Affect is tearful.        Speech: Speech normal.        Behavior: Behavior normal. Behavior is cooperative.        Thought Content: Thought content does not include homicidal or suicidal ideation.        Assessment & Plan    I discussed the assessment and treatment plan with the patient. The patient was provided an opportunity to ask questions and all were answered. The patient agreed with the plan and demonstrated an understanding of the instructions.   The patient was advised to  call back or seek an in-person evaluation if the symptoms worsen or if the condition fails to improve as anticipated.   Problem List Items Addressed This Visit      Cardiovascular and Mediastinum   Hypertension - Primary    Well controlled, but discussed lower goal given that she is young Will increase amlodipine to 10mg  daily      Relevant Medications   amLODipine (NORVASC) 10 MG tablet  Other   Postpartum anxiety    New problem Classic symptoms for postpartum anxiety/depresssion Will start Zoloft 50mg  daily Discussed side effects and benefits Discussed this is safe in breastfeeding Will send FYI to OB/GYN Will f/u in 4 wks and consider dose titration Contracted for safety - no SI/HI Repeat screenings at next visit      Relevant Medications   sertraline (ZOLOFT) 50 MG tablet       Return in about 4 weeks (around 10/03/2018) for PPD, BP f/u.   The entirety of the information documented in the History of Present Illness, Review of Systems and Physical Exam were personally obtained by me. Portions of this information were initially documented by Tiburcio Pea, CMA and reviewed by me for thoroughness and accuracy.    Bacigalupo, Dionne Bucy, MD MPH [facility]

## 2018-09-05 NOTE — Assessment & Plan Note (Signed)
New problem Classic symptoms for postpartum anxiety/depresssion Will start Zoloft 50mg  daily Discussed side effects and benefits Discussed this is safe in breastfeeding Will send FYI to OB/GYN Will f/u in 4 wks and consider dose titration Contracted for safety - no SI/HI Repeat screenings at next visit

## 2018-09-08 ENCOUNTER — Encounter: Payer: Self-pay | Admitting: Family Medicine

## 2018-09-08 DIAGNOSIS — O99345 Other mental disorders complicating the puerperium: Secondary | ICD-10-CM

## 2018-10-04 ENCOUNTER — Telehealth (INDEPENDENT_AMBULATORY_CARE_PROVIDER_SITE_OTHER): Payer: BC Managed Care – PPO | Admitting: Family Medicine

## 2018-10-04 ENCOUNTER — Encounter: Payer: Self-pay | Admitting: Family Medicine

## 2018-10-04 VITALS — BP 130/89 | HR 72

## 2018-10-04 DIAGNOSIS — O99345 Other mental disorders complicating the puerperium: Secondary | ICD-10-CM

## 2018-10-04 DIAGNOSIS — F418 Other specified anxiety disorders: Secondary | ICD-10-CM | POA: Diagnosis not present

## 2018-10-04 DIAGNOSIS — M25461 Effusion, right knee: Secondary | ICD-10-CM | POA: Insufficient documentation

## 2018-10-04 DIAGNOSIS — I10 Essential (primary) hypertension: Secondary | ICD-10-CM | POA: Diagnosis not present

## 2018-10-04 DIAGNOSIS — M25462 Effusion, left knee: Secondary | ICD-10-CM

## 2018-10-04 MED ORDER — SERTRALINE HCL 100 MG PO TABS: 100.0000 mg | ORAL_TABLET | Freq: Every day | ORAL | 3 refills | Status: DC

## 2018-10-04 NOTE — Assessment & Plan Note (Signed)
Improving, but not quite to goal Symptoms classic for postpartum anxiety/depression Doing well on Zoloft, but will increase dose to 100 mg daily to see if we can help with symptoms more Contracted for safety-no SI Follow-up in 6-8  Weeks Repeat [place], GAD-7, PHQ 9 at next visit

## 2018-10-04 NOTE — Progress Notes (Signed)
Patient: Angela Weaver A Frazee Female    DOB: June 07, 1985   33 y.o.   MRN: [id number] Visit Date: 10/04/2018  Today's Provider: Shirlee LatchAngela Bacigalupo, MD   Chief Complaint  Patient presents with  . Hypertension  . Postpartum Care   Subjective:    I, Porsha McClurkin CMA, am acting as a scribe for Shirlee LatchAngela Bacigalupo, MD.   Virtual Visit via Video Note  I connected with Angela BearEsther A Grygiel on 10/04/18 at 10:40 AM EDT by a video enabled telemedicine application and verified that I am speaking with the correct person using two identifiers.   Patient location: home Provider [facility] Persons involved in the visit: patient, provider   I discussed the limitations of evaluation and management by telemedicine and the availability of in person appointments. The patient expressed understanding and agreed to proceed.  HPI  Hypertension, follow-up:  BP Readings from Last 3 Encounters:  10/04/18 130/89  09/05/18 130/85  08/16/18 130/90    She was last seen for hypertension 4 weeks ago.  BP at that visit was 130/85. Management changes since that visit include: increased Amlodipine 10 mg daily. She reports good compliance with treatment. She is not having side effects.  She is exercising. She is adherent to low salt diet.   Outside blood pressures are:120's-130's/80's. She is experiencing none.  Patient denies chest pain, chest pressure/discomfort, exertional chest pressure/discomfort, fatigue, irregular heart beat, lower extremity edema, palpitations and tachypnea.   Cardiovascular risk factors include hypertension.  Use of agents associated with hypertension: none.     Weight trend: stable Wt Readings from Last 3 Encounters:  08/16/18 168 lb (76.2 kg)  07/03/18 180 lb (81.6 kg)  07/02/18 180 lb (81.6 kg)    Current diet: well balanced  ------------------------------------------------------------------------  Postpartum Anxiety Patient  presents today for PPD 4 weeks follow-up. Patient was started on Zoloft 50mg  daily. Patient reports good compliance with treatment and no side effects at the moment.  Started back to work ~3 weeks ago - having intermittent swelling and redness of bilateral knees.  No swelling elsewhere in her legs.  No knee pain  GAD 7 : Generalized Anxiety Score 10/04/2018 09/05/2018  Nervous, Anxious, on Edge 2 3  Control/stop worrying 1 3  Worry too much - different things 0 3  Trouble relaxing 1 3  Restless 1 2  Easily annoyed or irritable 2 3  Afraid - awful might happen 1 3  Total GAD 7 Score 8 20  Anxiety Difficulty Somewhat difficult Very difficult   Edinburgh Postnatal Depression Scale Screening Tool 10/04/2018 09/05/2018 08/16/2018  I have been able to laugh and see the funny side of things. 1 1 0  I have looked forward with enjoyment to things. 0 2 0  I have blamed myself unnecessarily when things went wrong. 2 3 1   I have been anxious or worried for no good reason. 2 3 2   I have felt scared or panicky for no good reason. 2 3 2   Things have been getting on top of me. 1 2 1   I have been so unhappy that I have had difficulty sleeping. 0 3 0  I have felt sad or miserable. 1 1 1   I have been so unhappy that I have been crying. 1 3 1   The thought of harming myself has occurred to me. 0 0 0  Edinburgh Postnatal Depression Scale Total 10 21 8      [facility] 10/04/2018 09/05/2018  08/22/2018  Decreased Interest 0 0 0  Down, Depressed, Hopeless 0 3 1  PHQ - 2 Score 0 3 1  Altered sleeping - 3 0  Tired, decreased energy - 3 0  Change in appetite - 0 0  Feeling bad or failure about yourself  - 1 0  Trouble concentrating - 2 0  Moving slowly or fidgety/restless - 0 0  Suicidal thoughts - 0 0  PHQ-9 Score - 12 1  Difficult doing work/chores - Somewhat difficult Not difficult at all     Allergies  Allergen Reactions  . Coconut Flavor Hives and Itching  . Sulfa Antibiotics Hives and  Other (See Comments)    Told by mother    . Coconut Oil Rash  . Codeine Rash and Other (See Comments)    Been told by mother    . Codone [Hydrocodone] Anxiety     Current Outpatient Medications:  .  amLODipine (NORVASC) 10 MG tablet, Take 1 tablet (10 mg total) by mouth daily., Disp: 30 tablet, Rfl: 2 .  norethindrone (MICRONOR) 0.35 MG tablet, Take 1 tablet (0.35 mg total) by mouth daily. (Patient not taking: Reported on 08/22/2018), Disp: 1 Package, Rfl: 11 .  prenatal vitamin w/FE, FA (PRENATAL 1 + 1) 27-1 MG TABS tablet, Take 1 tablet by mouth every evening. , Disp: , Rfl:  .  sertraline (ZOLOFT) 50 MG tablet, Take 1 tablet (50 mg total) by mouth daily., Disp: 30 tablet, Rfl: 3  Review of Systems  Constitutional: Negative.   Genitourinary: Negative.   Neurological: Negative.   Psychiatric/Behavioral: Negative.     Social History   Tobacco Use  . Smoking status: Former Smoker    Packs/day: 0.25    Years: 2.00    Pack years: 0.50    Types: Cigarettes    Quit date: 04/26/2011    Years since quitting: 7.4  . Smokeless tobacco: Never Used  . Tobacco comment: more socially   Substance Use Topics  . Alcohol use: No      Objective:   BP 130/89 (BP Location: Left Arm, Patient Position: Sitting, Cuff Size: Normal)   Pulse 72  Vitals:   10/04/18 0959  BP: 130/89  Pulse: 72     Physical Exam Vitals signs reviewed.  Constitutional:      Appearance: Normal appearance.  Eyes:     General: No scleral icterus.    Conjunctiva/sclera: Conjunctivae normal.  Pulmonary:     Effort: Pulmonary effort is normal. No respiratory distress.  Neurological:     Mental Status: She is alert and oriented to person, place, and time. Mental status is at baseline.  Psychiatric:        Mood and Affect: Mood normal.        Behavior: Behavior normal.      No results found for any visits on 10/04/18.     Assessment & Plan    I discussed the assessment and treatment plan with the  patient. The patient was provided an opportunity to ask questions and all were answered. The patient agreed with the plan and demonstrated an understanding of the instructions.   The patient was advised to call back or seek an in-person evaluation if the symptoms worsen or if the condition fails to improve as anticipated.  Problem List Items Addressed This Visit      Cardiovascular and Mediastinum   Hypertension - Primary    Well controlled Continue amlodipine 10mg  daily  Reviewed last metabolic panel Follow-up in 3  to 6 months        Other   Postpartum anxiety    Improving, but not quite to goal Symptoms classic for postpartum anxiety/depression Doing well on Zoloft, but will increase dose to 100 mg daily to see if we can help with symptoms more Contracted for safety-no SI Follow-up in 6-8  Weeks Repeat [place], GAD-7, PHQ 9 at next visit      Relevant Medications   sertraline (ZOLOFT) 100 MG tablet   Bilateral knee swelling    New problem Not associated with any pain Discussed with patient that this is unlikely to be a medication side effect Suspect this is likely due to her previous immobility during the postpartum.  And now going back to work where she stands on her feet all day Discussed return precautions          Return in about 2 months (around 12/05/2018) for PPD/anxiety f/u.   The entirety of the information documented in the History of Present Illness, Review of Systems and Physical Exam were personally obtained by me. Portions of this information were initially documented by [facility], CMA and reviewed by me for thoroughness and accuracy.    Bacigalupo, Dionne Bucy, MD MPH [facility]

## 2018-10-04 NOTE — Assessment & Plan Note (Signed)
New problem Not associated with any pain Discussed with patient that this is unlikely to be a medication side effect Suspect this is likely due to her previous immobility during the postpartum.  And now going back to work where she stands on her feet all day Discussed return precautions

## 2018-10-04 NOTE — Assessment & Plan Note (Signed)
Well controlled Continue amlodipine 10mg  daily  Reviewed last metabolic panel Follow-up in 3 to 6 months

## 2018-10-04 NOTE — Patient Instructions (Addendum)
Increase zoloft to 100mg  daily  No change to BP meds

## 2018-10-09 ENCOUNTER — Other Ambulatory Visit: Payer: Self-pay | Admitting: Obstetrics & Gynecology

## 2018-10-27 ENCOUNTER — Other Ambulatory Visit: Payer: Self-pay | Admitting: Family Medicine

## 2018-11-14 NOTE — Progress Notes (Signed)
Patient: Angela Weaver Female    DOB: 1985-10-02   33 y.o.   MRN: [id number] Visit Date: 11/15/2018  Today's Provider: Shirlee LatchAngela , MD   Chief Complaint  Patient presents with  . Anxiety   Subjective:    I, Porsha McClurkin CMA, am acting as a scribe for Shirlee LatchAngela , MD.   Virtual Visit via Video Note  I connected with Angela Weaver on 11/15/18 at  9:40 AM EDT by a video enabled telemedicine application and verified that I am speaking with the correct person using two identifiers.   Patient location: home Provider [facility] Persons involved in the visit: patient, provider   I discussed the limitations of evaluation and management by telemedicine and the availability of in person appointments. The patient expressed understanding and agreed to proceed.  HPI  Anxiety Patient present via virtual visit for anxiety. Patient was seen virtually on 10/04/2018 and changes made at that visit was increase Zoloft to 100 MG daily. Patient denies any side effects at the moment. Patient reports good compliance with treatment.  She is feeling a good bit better.  Some days she will take an extra 50mg  of Zoloft on bad days.  This happens about once per week  When at work, knees continue to swell and become red. Nearly every day. No pain associated with it. Was not a problem before giving birth  GAD 7 : Generalized Anxiety Score 11/15/2018 10/04/2018 09/05/2018  Nervous, Anxious, on Edge 1 2 3   Control/stop worrying 1 1 3   Worry too much - different things 1 0 3  Trouble relaxing 2 1 3   Restless 1 1 2   Easily annoyed or irritable 2 2 3   Afraid - awful might happen 1 1 3   Total GAD 7 Score 9 8 20   Anxiety Difficulty Somewhat difficult Somewhat difficult Very difficult    Dep[facility] screen [facility] 11/15/2018 10/04/2018 09/05/2018 08/22/2018  Decreased Interest 1 0 0 0  Down, Depressed, Hopeless 1 0 3 1  PHQ - 2 Score 2 0 3 1  Altered sleeping  0 - 3 0  Tired, decreased energy 1 - 3 0  Change in appetite 0 - 0 0  Feeling bad or failure about yourself  1 - 1 0  Trouble concentrating 1 - 2 0  Moving slowly or fidgety/restless 0 - 0 0  Suicidal thoughts 0 - 0 0  PHQ-9 Score 5 - 12 1  Difficult doing work/chores Somewhat difficult - Somewhat difficult Not difficult at all     Allergies  Allergen Reactions  . Coconut Flavor Hives and Itching  . Sulfa Antibiotics Hives and Other (See Comments)    Told by mother    . Coconut Oil Rash  . Codeine Rash and Other (See Comments)    Been told by mother    . Codone [Hydrocodone] Anxiety     Current Outpatient Medications:  .  amLODipine (NORVASC) 10 MG tablet, TAKE 1 TABLET BY MOUTH EVERY DAY, Disp: 90 tablet, Rfl: 1 .  norethindrone (MICRONOR) 0.35 MG tablet, TAKE 1 TABLET BY MOUTH EVERY DAY, Disp: 84 tablet, Rfl: 3 .  prenatal vitamin w/FE, FA (PRENATAL 1 + 1) 27-1 MG TABS tablet, Take 1 tablet by mouth every evening. , Disp: , Rfl:  .  sertraline (ZOLOFT) 100 MG tablet, Take 1.5 tablets (150 mg total) by mouth daily., Disp: 45 tablet, Rfl: 3  Review of Systems  Constitutional: Negative.   Respiratory:  Negative.   Genitourinary: Negative.   Hematological: Negative.     Social History   Tobacco Use  . Smoking status: Former Smoker    Packs/day: 0.25    Years: 2.00    Pack years: 0.50    Types: Cigarettes    Quit date: 04/26/2011    Years since quitting: 7.5  . Smokeless tobacco: Never Used  . Tobacco comment: more socially   Substance Use Topics  . Alcohol use: No      Objective:   There were no vitals taken for this visit. There were no vitals filed for this visit.   Physical Exam Constitutional:      General: She is not in acute distress.    Appearance: Normal appearance.  HENT:     Head: Normocephalic and atraumatic.  Eyes:     Conjunctiva/sclera: Conjunctivae normal.  Pulmonary:     Effort: Pulmonary effort is normal. No respiratory distress.   Neurological:     Mental Status: She is alert and oriented to person, place, and time. Mental status is at baseline.  Psychiatric:        Mood and Affect: Mood normal.        Behavior: Behavior normal.     No results found for any visits on 11/15/18.     Assessment & Plan      I discussed the assessment and treatment plan with the patient. The patient was provided an opportunity to ask questions and all were answered. The patient agreed with the plan and demonstrated an understanding of the instructions.   The patient was advised to call back or seek an in-person evaluation if the symptoms worsen or if the condition fails to improve as anticipated.  Problem List Items Addressed This Visit      Respiratory   Allergic rhinitis    Advised that it is safe to take antihistamine while breastfeeding, but it may decrease supply Will try Flonase instead        Other   Postpartum anxiety - Primary    Improving, but not to goal Needs extra doses of the medication intermittently Increase Zoloft ot 150 mg daily Contracted for safety - no SI F/u in 6-8 weeks Repeat GAD7 and PHQ9 at next visit      Relevant Medications   sertraline (ZOLOFT) 100 MG tablet   Bilateral knee swelling    Ongoing No pain, but +redness No LE edema associated with it Will refer to Ortho for further eval and management      Relevant Orders   Ambulatory referral to Orthopedic Surgery       Return in about 6 weeks (around 12/27/2018) for MDD/GAD f/u.   The entirety of the information documented in the History of Present Illness, Review of Systems and Physical Exam were personally obtained by me. Portions of this information were initially documented by [facility], CMA and reviewed by me for thoroughness and accuracy.    , Dionne Bucy, MD MPH [facility]

## 2018-11-15 ENCOUNTER — Telehealth (INDEPENDENT_AMBULATORY_CARE_PROVIDER_SITE_OTHER): Payer: BC Managed Care – PPO | Admitting: Family Medicine

## 2018-11-15 DIAGNOSIS — M25461 Effusion, right knee: Secondary | ICD-10-CM

## 2018-11-15 DIAGNOSIS — F418 Other specified anxiety disorders: Secondary | ICD-10-CM

## 2018-11-15 DIAGNOSIS — O99345 Other mental disorders complicating the puerperium: Secondary | ICD-10-CM

## 2018-11-15 DIAGNOSIS — J301 Allergic rhinitis due to pollen: Secondary | ICD-10-CM

## 2018-11-15 DIAGNOSIS — J309 Allergic rhinitis, unspecified: Secondary | ICD-10-CM | POA: Insufficient documentation

## 2018-11-15 DIAGNOSIS — M25462 Effusion, left knee: Secondary | ICD-10-CM

## 2018-11-15 MED ORDER — SERTRALINE HCL 100 MG PO TABS: 150.0000 mg | ORAL_TABLET | Freq: Every day | ORAL | 3 refills | Status: DC

## 2018-11-15 NOTE — Assessment & Plan Note (Signed)
Improving, but not to goal Needs extra doses of the medication intermittently Increase Zoloft ot 150 mg daily Contracted for safety - no SI F/u in 6-8 weeks Repeat GAD7 and PHQ9 at next visit

## 2018-11-15 NOTE — Assessment & Plan Note (Signed)
Advised that it is safe to take antihistamine while breastfeeding, but it may decrease supply Will try Flonase instead

## 2018-11-15 NOTE — Assessment & Plan Note (Signed)
Ongoing No pain, but +redness No LE edema associated with it Will refer to Ortho for further eval and management

## 2018-12-10 ENCOUNTER — Encounter: Payer: Self-pay | Admitting: Obstetrics & Gynecology

## 2018-12-10 ENCOUNTER — Other Ambulatory Visit: Payer: Self-pay | Admitting: Obstetrics and Gynecology

## 2018-12-10 MED ORDER — DICLOXACILLIN SODIUM 500 MG PO CAPS: 500.0000 mg | ORAL_CAPSULE | Freq: Four times a day (QID) | ORAL | 0 refills | Status: AC

## 2018-12-10 NOTE — Telephone Encounter (Signed)
Rx sent she needs an appointment tomorrow or Wednesday though

## 2018-12-10 NOTE — Telephone Encounter (Signed)
Patient is schedule per patient request for Wednesday with [facility] at 10:20 12/12/18

## 2018-12-11 ENCOUNTER — Other Ambulatory Visit: Payer: Self-pay | Admitting: Obstetrics & Gynecology

## 2018-12-12 ENCOUNTER — Other Ambulatory Visit: Payer: Self-pay

## 2018-12-12 ENCOUNTER — Ambulatory Visit: Payer: BC Managed Care – PPO | Admitting: Obstetrics & Gynecology

## 2018-12-12 ENCOUNTER — Other Ambulatory Visit: Payer: Self-pay | Admitting: Obstetrics & Gynecology

## 2018-12-12 NOTE — Progress Notes (Signed)
Virtual Visit via Telephone Note  I connected with Angela Weaver on 12/12/18 at  by telephone and verified that I am speaking with the correct person using two identifiers.   I discussed the limitations, risks, security and privacy concerns of performing an evaluation and management service by telephone and the availability of in person appointments. I also discussed with the patient that there may be a patient responsible charge related to this service. The patient expressed understanding and agreed to proceed. She was at home and I was in my office.  History of Present Illness: Pt has h/o pregnancy delivery then breastfeeding; recent redness and pain to right breast and warmth; also chills. No fever.  Started Dicloxacillin Monday and feels better, less redness reported.  Baby feeding well.Pt anxious to come into office (post partum anxiety, separation anxiety).   Observations/Objective: No exam today, due to telephone eVisit due to [facility] virus restriction on elective visits and procedures.  Prior visits reviewed along with ultrasounds/labs as indicated.  Assessment and Plan: Mastitis, improving w ABX therapy Appt if worsens; importance of examination in these cases counseled.  Follow Up Instructions: As needed   I discussed the assessment and treatment plan with the patient. The patient was provided an opportunity to ask questions and all were answered. The patient agreed with the plan and demonstrated an understanding of the instructions.   The patient was advised to call back or seek an in-person evaluation if the symptoms worsen or if the condition fails to improve as anticipated.  I provided 8 minutes of non-face-to-face time during this encounter.   Hoyt Koch, MD

## 2018-12-24 ENCOUNTER — Ambulatory Visit (INDEPENDENT_AMBULATORY_CARE_PROVIDER_SITE_OTHER): Payer: BC Managed Care – PPO | Admitting: Family Medicine

## 2018-12-24 ENCOUNTER — Encounter: Payer: Self-pay | Admitting: Family Medicine

## 2018-12-24 DIAGNOSIS — R0982 Postnasal drip: Secondary | ICD-10-CM

## 2018-12-24 DIAGNOSIS — H9201 Otalgia, right ear: Secondary | ICD-10-CM

## 2018-12-24 MED ORDER — AMOXICILLIN 875 MG PO TABS: 875.0000 mg | ORAL_TABLET | Freq: Two times a day (BID) | ORAL | 0 refills | Status: DC

## 2018-12-24 NOTE — Progress Notes (Signed)
Appt canceled.  This encounter was created in error - please disregard. 

## 2018-12-24 NOTE — Progress Notes (Signed)
Patient: Angela Weaver Female    DOB: Jun 27, 1985   33 y.o.   MRN: [id number] Visit Date: 12/24/2018  Today's Provider: Dortha Kern, PA   Chief Complaint  Patient presents with  . Otalgia  . Nasal Congestion   Subjective:     HPI  Sinus drainage, stuffy nose, right ear pain that started yesterday. Patient states that she has taken some Tylenol.   Virtual Visit via Telephone Note  I connected with Angela Weaver on 12/24/18 at  3:40 PM EDT by telephone and verified that I am speaking with the correct person using two identifiers.  I discussed the limitations, risks, security and privacy concerns of performing an evaluation and management service by telephone and the availability of in person appointments. I also discussed with the patient that there may be a patient responsible charge related to this service. The patient expressed understanding and agreed to proceed.   Virtual Visit via Telephone Note  I connected with Angela Weaver on 12/24/18 at  3:40 PM EDT by telephone and verified that I am speaking with the correct person using two identifiers.  Location: Patient: Home Provider: Office   I discussed the limitations, risks, security and privacy concerns of performing an evaluation and management service by telephone and the availability of in person appointments. I also discussed with the patient that there may be a patient responsible charge related to this service. The patient expressed understanding and agreed to proceed.  Past Medical History:  Diagnosis Date  . Anemia   . Heart murmur    SAW CARDIOLOGIST IN 2011 AND EVERYTHING WAS FINE PER PT-  ASYPMTOMATIC  . Hypertension    Chronic  . Migraine    migraines  . Vaginal Pap smear, abnormal    LGSIL   Past Surgical History:  Procedure Laterality Date  . DILATION AND EVACUATION  04/27/2017   Procedure: DILATATION AND EVACUATION;  Surgeon: Nadara Mustard, MD;  Location: [facility] ORS;   Service: Gynecology;;  . KNEE SURGERY    . TONSILLECTOMY    . WISDOM TOOTH EXTRACTION     Family History  Problem Relation Age of Onset  . Melanoma Maternal Aunt 50  . Melanoma Maternal Grandmother 72  . Diabetes Maternal Grandfather   . Kidney disease Mother   . Kidney disease Son        produces a lot of crystal and kidney stones  . Breast cancer Neg Hx   . Colon cancer Neg Hx    Allergies  Allergen Reactions  . Coconut Flavor Hives and Itching  . Sulfa Antibiotics Hives and Other (See Comments)    Told by mother    . Coconut Oil Rash  . Codeine Rash and Other (See Comments)    Been told by mother    . Codone [Hydrocodone] Anxiety    Current Outpatient Medications:  .  amLODipine (NORVASC) 10 MG tablet, TAKE 1 TABLET BY MOUTH EVERY DAY, Disp: 90 tablet, Rfl: 1 .  norethindrone (MICRONOR) 0.35 MG tablet, TAKE 1 TABLET BY MOUTH EVERY DAY, Disp: 84 tablet, Rfl: 3 .  sertraline (ZOLOFT) 100 MG tablet, Take 1.5 tablets (150 mg total) by mouth daily., Disp: 45 tablet, Rfl: 3 .  prenatal vitamin w/FE, FA (PRENATAL 1 + 1) 27-1 MG TABS tablet, Take 1 tablet by mouth every evening. , Disp: , Rfl:   Review of Systems  Constitutional: Negative.   HENT: Positive for ear pain, postnasal drip and rhinorrhea.  Respiratory: Negative.   Gastrointestinal: Negative.    Social History   Tobacco Use  . Smoking status: Former Smoker    Packs/day: 0.25    Years: 2.00    Pack years: 0.50    Types: Cigarettes    Quit date: 04/26/2011    Years since quitting: 7.6  . Smokeless tobacco: Never Used  . Tobacco comment: more socially   Substance Use Topics  . Alcohol use: No      Objective:   There were no vitals taken for this visit. There were no vitals filed for this visit.There is no height or weight on file to calculate BMI.  No apparent respiratory distress during telephonic interview.   Assessment & Plan    1. Earache on right Developed right earache over the past 24  hours. No fever, cough, loss of taste/smell or dyspnea. Similar to past ear infections. May use Claritin qd for PND since she is breastfeeding. Denies any sore throat. Describes earache as a pressure sensation. Will treat with Amoxil and should recheck if any new symptoms or fever occur. - amoxicillin (AMOXIL) 875 MG tablet; Take 1 tablet (875 mg total) by mouth 2 (two) times daily.  Dispense: 20 tablet; Refill: 0  2. PND (post-nasal drip) PND started yesterday. No purulent drainage. Causing some stuffy nose with ear pressure. Increase fluid intake and recheck if no better in 4-5 days.    Follow Up Instructions:  I discussed the assessment and treatment plan with the patient. The patient was provided an opportunity to ask questions and all were answered. The patient agreed with the plan and demonstrated an understanding of the instructions.   The patient was advised to call back or seek an in-person evaluation if the symptoms worsen or if the condition fails to improve as anticipated.  I provided 15 minutes of non-face-to-face time during this encounter.   [employer], PA   Vernie Murders, [place]  [facility]

## 2018-12-25 ENCOUNTER — Encounter: Payer: BC Managed Care – PPO | Admitting: Family Medicine

## 2018-12-25 ENCOUNTER — Encounter: Payer: Self-pay | Admitting: Family Medicine

## 2018-12-27 ENCOUNTER — Encounter: Payer: Self-pay | Admitting: Family Medicine

## 2018-12-27 ENCOUNTER — Telehealth (INDEPENDENT_AMBULATORY_CARE_PROVIDER_SITE_OTHER): Payer: BC Managed Care – PPO | Admitting: Family Medicine

## 2018-12-27 DIAGNOSIS — O99345 Other mental disorders complicating the puerperium: Secondary | ICD-10-CM

## 2018-12-27 DIAGNOSIS — F418 Other specified anxiety disorders: Secondary | ICD-10-CM

## 2018-12-27 MED ORDER — SERTRALINE HCL 100 MG PO TABS: 150.0000 mg | ORAL_TABLET | Freq: Every day | ORAL | 3 refills | Status: DC

## 2018-12-27 NOTE — Progress Notes (Signed)
Patient: Angela Weaver Female    DOB: 02/10/1986   33 y.o.   MRN: [id number] Visit Date: 12/27/2018  Today's Provider: Shirlee Latch, MD   Chief Complaint  Patient presents with  . Anxiety  . Depression   Subjective:    Virtual Visit via Video Note  I connected with [name] on 12/27/18 at 11:20 AM EDT by a video enabled telemedicine application and verified that I am speaking with the correct person using two identifiers.   Patient location: home Provider location: [facility] Persons involved in the visit: patient, provider   I discussed the limitations of evaluation and management by telemedicine and the availability of in person appointments. The patient expressed understanding and agreed to proceed.  HPI Depression & Anxiety:  Patient presents for a 6 week follow up. Last visit was on 11/15/2018. A patient advised to increase Zoloft to 150 mg daily. She reports good compliance with treatment plan. She states symptoms are much improved. She is feeling "really, really, really good."  GAD 7 : Generalized Anxiety Score 12/27/2018 11/15/2018 10/04/2018 09/05/2018  Nervous, Anxious, on Edge 0 1 2 3   Control/stop worrying 0 1 1 3   Worry too much - different things 0 1 0 3  Trouble relaxing 0 2 1 3   Restless 1 1 1 2   Easily annoyed or irritable 2 2 2 3   Afraid - awful might happen 0 1 1 3   Total GAD 7 Score 3 9 8 20   Anxiety Difficulty Not difficult at all Somewhat difficult Somewhat difficult Very difficult    Depression screen [facility] 2/9 12/27/2018 11/15/2018 10/04/2018  Decreased Interest 0 1 0  Down, Depressed, Hopeless 1 1 0  PHQ - 2 Score 1 2 0  Altered sleeping 0 0 -  Tired, decreased energy 0 1 -  Change in appetite 0 0 -  Feeling bad or failure about yourself  0 1 -  Trouble concentrating 1 1 -  Moving slowly or fidgety/restless 0 0 -  Suicidal thoughts 0 0 -  PHQ-9 Score 2 5 -  Difficult doing work/chores Not difficult at all  Somewhat difficult -    Allergies  Allergen Reactions  . Coconut Flavor Hives and Itching  . Sulfa Antibiotics Hives and Other (See Comments)    Told by mother    . Coconut Oil Rash  . Codeine Rash and Other (See Comments)    Been told by mother    . Codone [Hydrocodone] Anxiety     Current Outpatient Medications:  .  amLODipine (NORVASC) 10 MG tablet, TAKE 1 TABLET BY MOUTH EVERY DAY, Disp: 90 tablet, Rfl: 1 .  amoxicillin (AMOXIL) 875 MG tablet, Take 1 tablet (875 mg total) by mouth 2 (two) times daily., Disp: 20 tablet, Rfl: 0 .  norethindrone (MICRONOR) 0.35 MG tablet, TAKE 1 TABLET BY MOUTH EVERY DAY, Disp: 84 tablet, Rfl: 3 .  prenatal vitamin w/FE, FA (PRENATAL 1 + 1) 27-1 MG TABS tablet, Take 1 tablet by mouth every evening. , Disp: , Rfl:  .  sertraline (ZOLOFT) 100 MG tablet, Take 1.5 tablets (150 mg total) by mouth daily., Disp: 45 tablet, Rfl: 3  Review of Systems  Constitutional: Negative.   Respiratory: Negative.   Cardiovascular: Negative.   Musculoskeletal: Negative.   Psychiatric/Behavioral: The patient is nervous/anxious.        Depression     Social History   Tobacco Use  . Smoking status: Former Smoker  Packs/day: 0.25    Years: 2.00    Pack years: 0.50    Types: Cigarettes    Quit date: 04/26/2011    Years since quitting: 7.6  . Smokeless tobacco: Never Used  . Tobacco comment: more socially   Substance Use Topics  . Alcohol use: No      Objective:   There were no vitals taken for this visit. There were no vitals filed for this visit.There is no height or weight on file to calculate BMI.   Physical Exam Constitutional:      General: She is not in acute distress.    Appearance: Normal appearance.  Eyes:     General: No scleral icterus.    Conjunctiva/sclera: Conjunctivae normal.  Pulmonary:     Effort: Pulmonary effort is normal. No respiratory distress.  Neurological:     Mental Status: She is alert and oriented to person, place,  and time. Mental status is at baseline.  Psychiatric:        Mood and Affect: Mood normal.        Behavior: Behavior normal.      No results found for any visits on 12/27/18.     Assessment & Plan    I discussed the assessment and treatment plan with the patient. The patient was provided an opportunity to ask questions and all were answered. The patient agreed with the plan and demonstrated an understanding of the instructions.   The patient was advised to call back or seek an in-person evaluation if the symptoms worsen or if the condition fails to improve as anticipated.  Problem List Items Addressed This Visit      Other   Postpartum anxiety    Very well controlled Continue Zoloft 150 mg daily Contracted for safety-no SI Follow-up in 6 months or sooner as needed Encourage therapy Repeat GAD-7 and PHQ-9 at next visit      Relevant Medications   sertraline (ZOLOFT) 100 MG tablet       Return in about 6 months (around 06/27/2019) for CPE, MDD/GAD f/u.   The entirety of the information documented in the History of Present Illness, Review of Systems and Physical Exam were personally obtained by me. Portions of this information were initially documented by Tiburcio Pea, CMA and reviewed by me for thoroughness and accuracy.    Lamesha Tibbits, Dionne Bucy, MD MPH [facility]

## 2018-12-27 NOTE — Assessment & Plan Note (Signed)
Very well controlled Continue Zoloft 150 mg daily Contracted for safety-no SI Follow-up in 6 months or sooner as needed Encourage therapy Repeat GAD-7 and PHQ-9 at next visit

## 2019-04-24 IMAGING — US LIMITED OBSTETRIC ULTRASOUND
1 series · 14 of 25 positions shown · non-contrast
Comparison: none

CLINICAL DATA: Hypertension, evaluate AFI

EXAM:
LIMITED OBSTETRIC ULTRASOUND

[Series 1: limited obstetric ultrasound · 0.26mm/px · 14 of 25 slices shown]
[im 1/25]
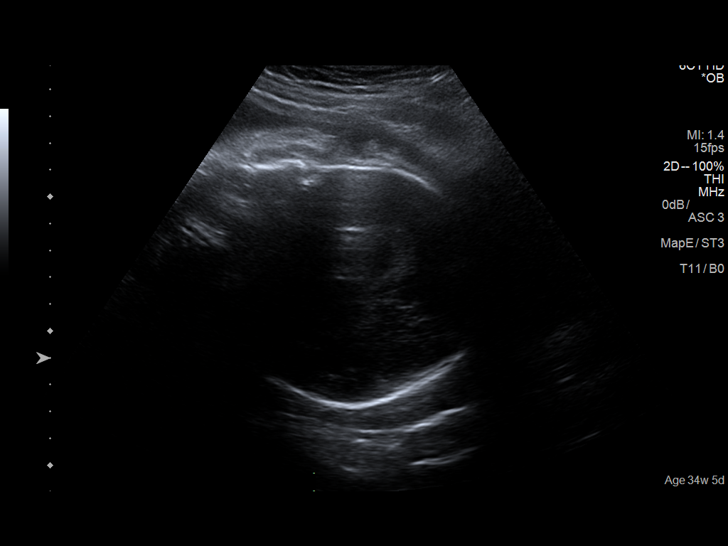
[im 3/25]
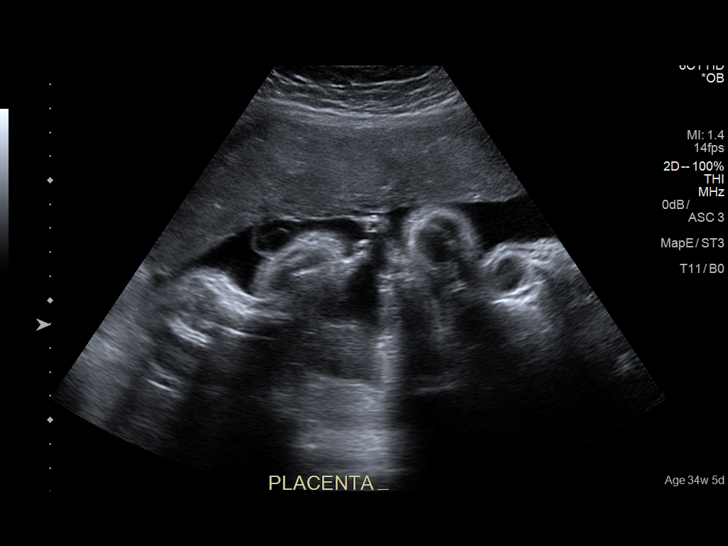
[im 5/25]
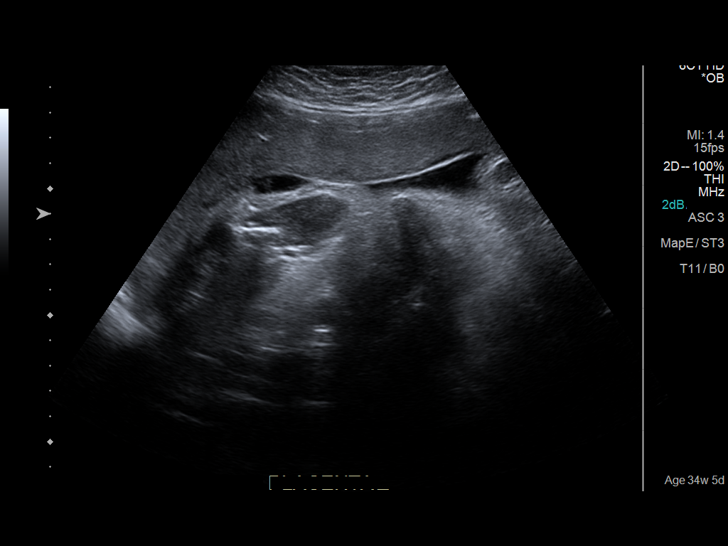
[im 7/25]
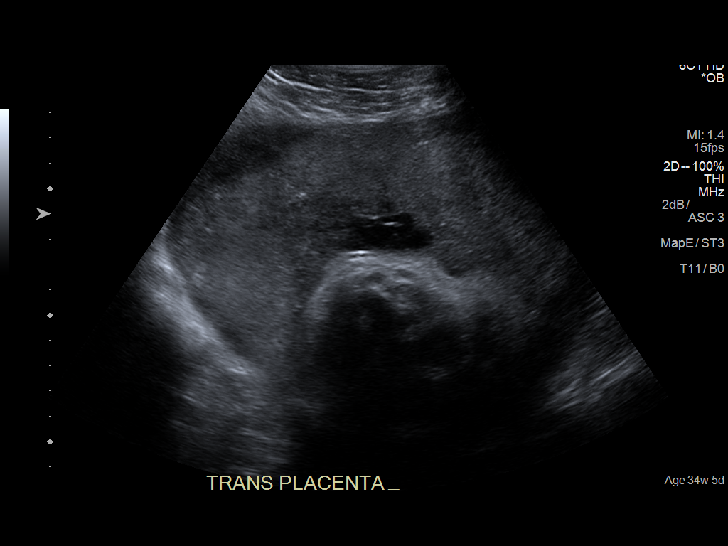
[im 9/25]
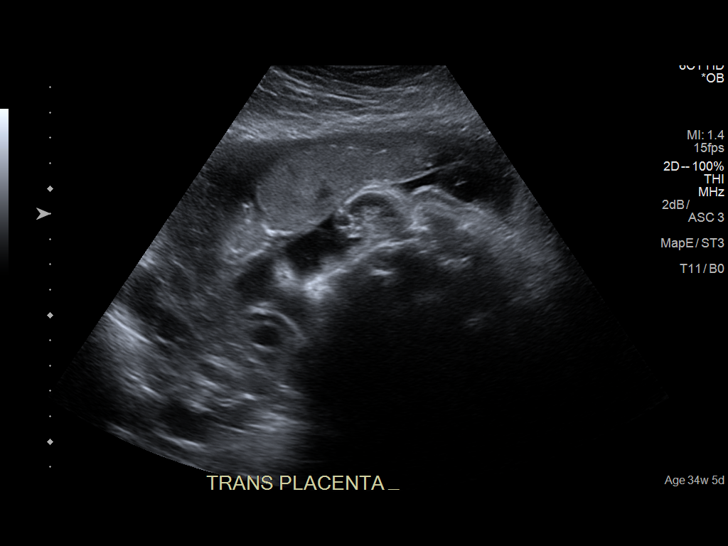
[im 10/25]
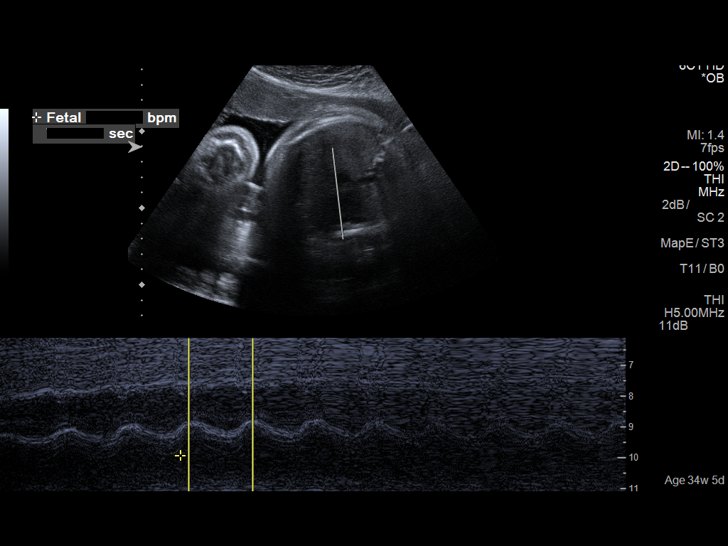
[im 12/25]
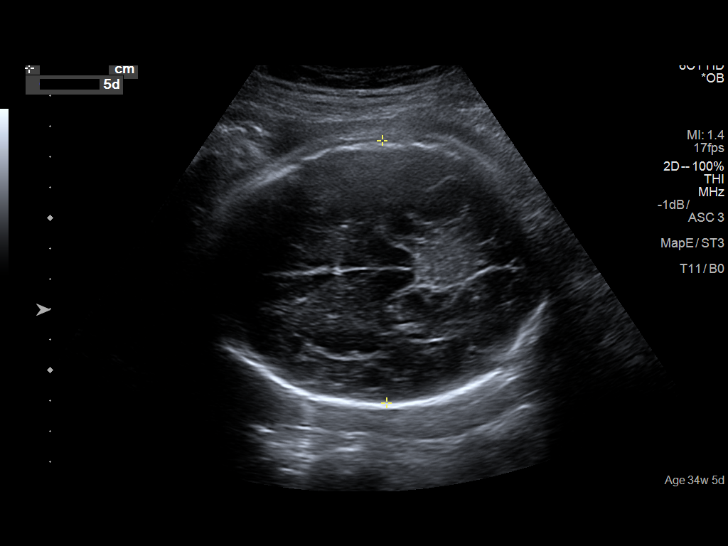
[im 14/25]
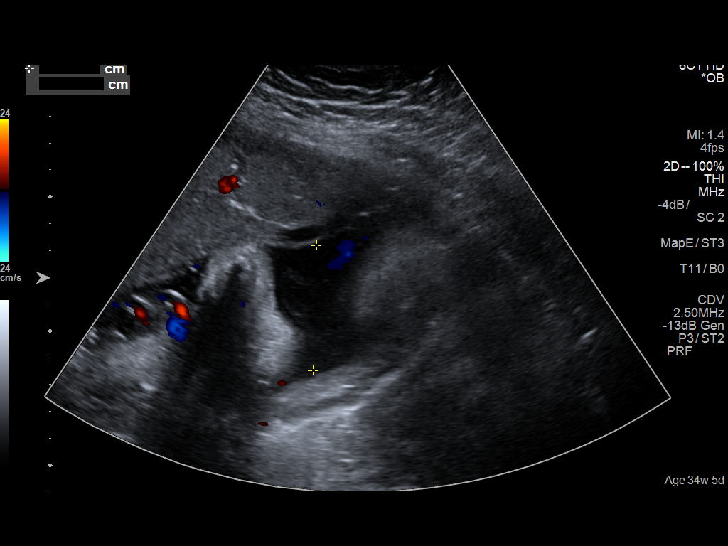
[im 16/25]
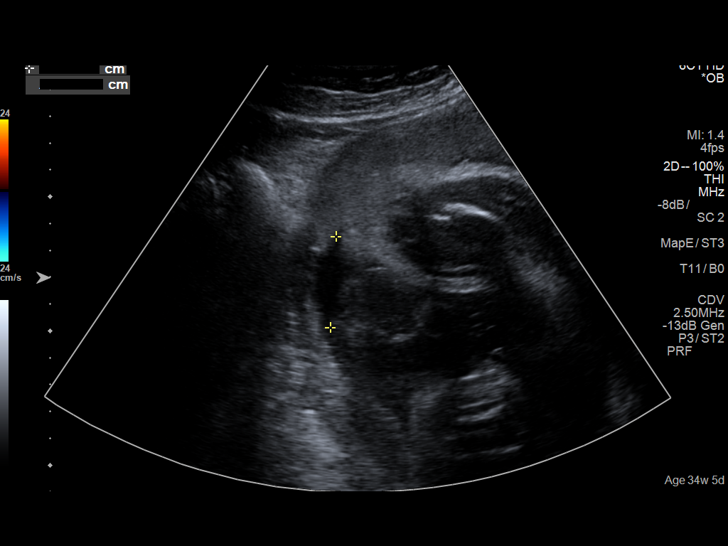
[im 17/25]
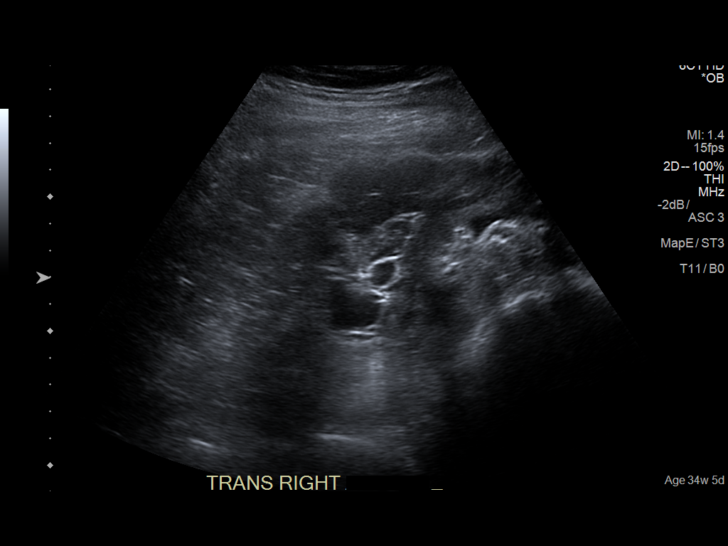
[im 19/25]
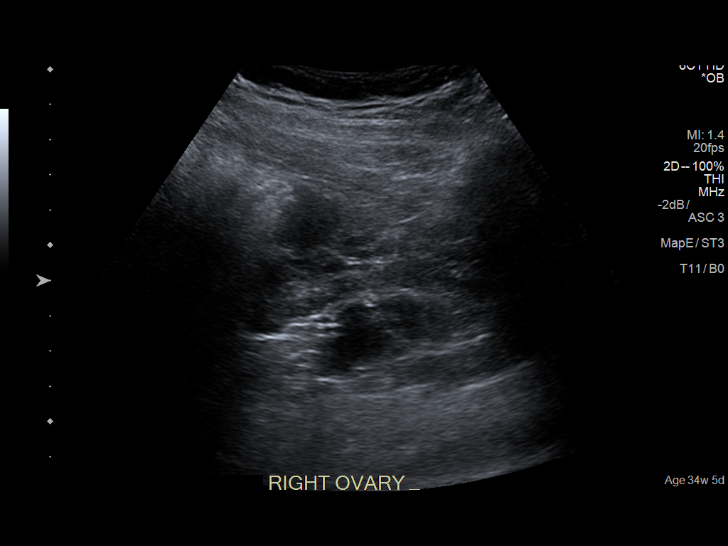
[im 21/25]
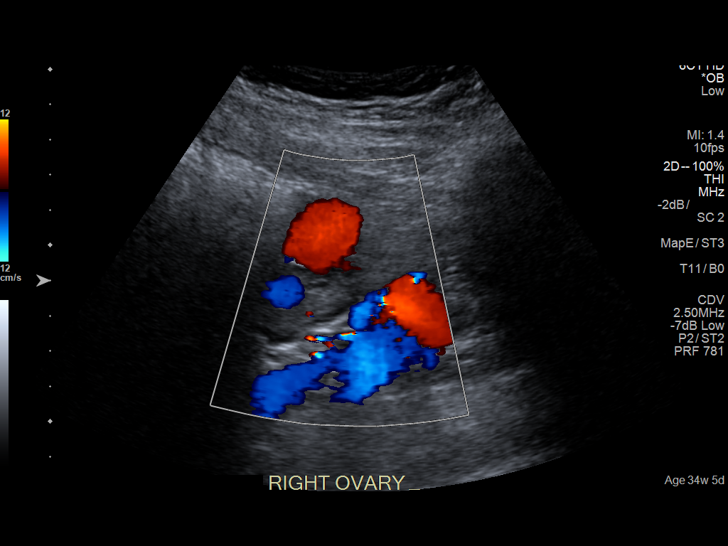
[im 23/25]
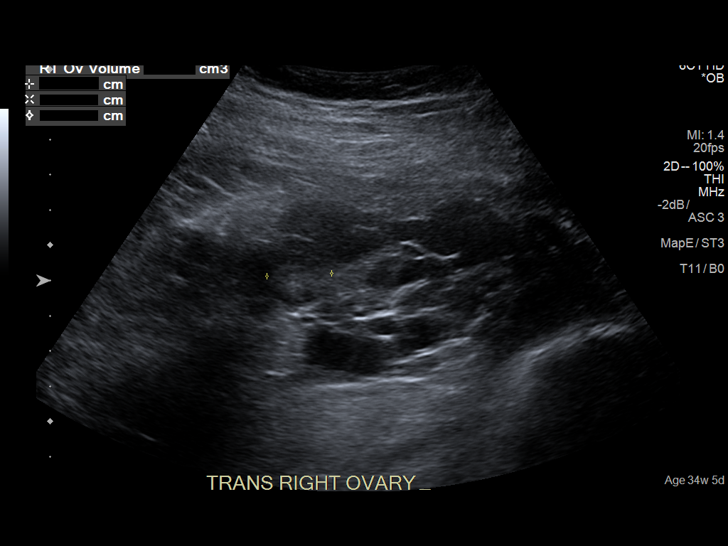
[im 25/25]
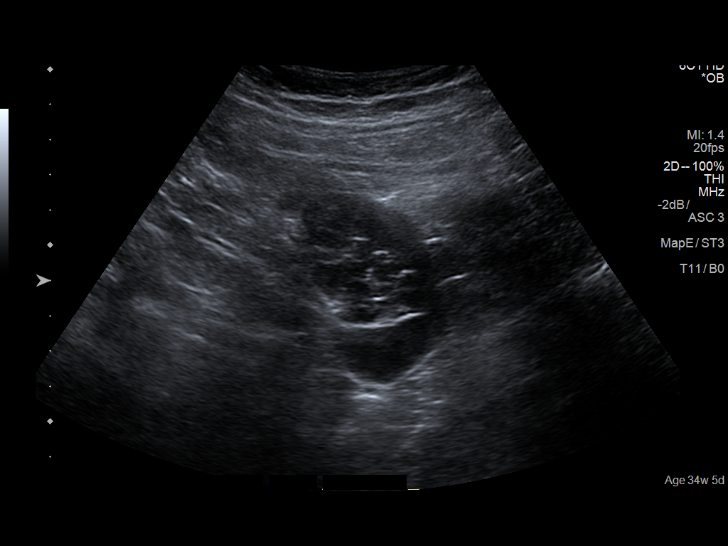

[14 of 25 positions shown; findings below may reference images not displayed]

FINDINGS: Number of Fetuses: 1

Heart Rate:  160 bpm

Movement: Yes

Presentation: Cephalic

Placental Location: Anterior

Previa: No

Amniotic Fluid (Subjective):  Normal

AFI: 15 cm

BPD: 8.6 cm 34 w  5 d

MATERNAL FINDINGS:

Cervix:  Cervix is not visualized.

Uterus/Adnexae: No abnormality visualized.
IMPRESSION: 1. Single live intrauterine pregnancy as detailed above.
2. AFI 15 cm

This exam is performed on an emergent basis and does not
comprehensively evaluate fetal size, dating, or anatomy; follow-up
complete OB US should be considered if further fetal assessment is
warranted.

## 2019-05-24 ENCOUNTER — Other Ambulatory Visit: Payer: Self-pay | Admitting: Family Medicine

## 2019-05-24 NOTE — Telephone Encounter (Signed)
Requested Prescriptions  Pending Prescriptions Disp Refills  . amLODipine (NORVASC) 10 MG tablet [Pharmacy Med Name: AMLODIPINE BESYLATE 10 MG TAB] 90 tablet 0    Sig: TAKE 1 TABLET BY MOUTH EVERY DAY     Cardiovascular:  Calcium Channel Blockers Passed - 05/24/2019  1:29 AM      Passed - Last BP in normal range    BP Readings from Last 1 Encounters:  10/04/18 130/89         Passed - Valid encounter within last 6 months    Recent Outpatient Visits          4 months ago Postpartum anxiety   [facility] [place], Marzella Schlein, MD   5 months ago Earache on right   [facility], [place], [place]   6 months ago Postpartum anxiety   [facility] [place], Marzella Schlein, MD   7 months ago Essential hypertension   [facility] [place], Marzella Schlein, MD   8 months ago Essential hypertension   [facility], Marzella Schlein, MD

## 2019-06-27 ENCOUNTER — Other Ambulatory Visit: Payer: Self-pay

## 2019-06-27 ENCOUNTER — Ambulatory Visit (INDEPENDENT_AMBULATORY_CARE_PROVIDER_SITE_OTHER): Payer: BC Managed Care – PPO | Admitting: Family Medicine

## 2019-06-27 ENCOUNTER — Encounter: Payer: Self-pay | Admitting: Family Medicine

## 2019-06-27 VITALS — BP 125/78 | HR 78 | Temp 96.8°F | Wt 163.0 lb

## 2019-06-27 DIAGNOSIS — D649 Anemia, unspecified: Secondary | ICD-10-CM | POA: Diagnosis not present

## 2019-06-27 DIAGNOSIS — I10 Essential (primary) hypertension: Secondary | ICD-10-CM

## 2019-06-27 DIAGNOSIS — Z0000 Encounter for general adult medical examination without abnormal findings: Secondary | ICD-10-CM | POA: Diagnosis not present

## 2019-06-27 NOTE — Assessment & Plan Note (Signed)
Well controlled Continue current medications Recheck metabolic panel F/u in 6 months  

## 2019-06-27 NOTE — Progress Notes (Signed)
Patient: Angela Weaver, Female    DOB: 1985-06-06, 34 y.o.   MRN: [id number] Visit Date: 06/27/2019  Today's Provider: Shirlee Latch, MD   Chief Complaint  Patient presents with  . Annual Exam   Subjective:     Annual physical exam [name] is a 34 y.o. female who presents today for health maintenance and complete physical. She feels fairly well. She reports exercising by walking. She reports she is sleeping fairly well. 08/16/2018  Pap-Negative  05/14/2018 Tdap -----------------------------------------------------------------   Review of Systems  Constitutional: Negative.   HENT: Positive for dental problem and postnasal drip. Negative for congestion, drooling, ear discharge, ear pain, facial swelling, hearing loss, mouth sores, nosebleeds, rhinorrhea, sinus pressure, sinus pain, sneezing, sore throat, tinnitus, trouble swallowing and voice change.   Eyes: Positive for itching. Negative for photophobia, pain, discharge, redness and visual disturbance.  Respiratory: Negative.   Cardiovascular: Negative.   Gastrointestinal: Negative.   Endocrine: Negative.   Genitourinary: Negative.   Musculoskeletal: Negative.   Skin: Negative.   Allergic/Immunologic: Positive for environmental allergies. Negative for food allergies and immunocompromised state.  Neurological: Negative.   Hematological: Negative.   Psychiatric/Behavioral: Positive for agitation and sleep disturbance. Negative for behavioral problems, confusion, decreased concentration, dysphoric mood, hallucinations, self-injury and suicidal ideas. The patient is not nervous/anxious and is not hyperactive.     Social History      She  reports that she quit smoking about 8 years ago. Her smoking use included cigarettes. She has a 0.50 pack-year smoking history. She has never used smokeless tobacco. She reports that she does not drink alcohol or use drugs.       Social History   Socioeconomic History   . Marital status: Married    Spouse name: Not on file  . Number of children: 4  . Years of education: Not on file  . Highest education level: Not on file  Occupational History  . Not on file  Tobacco Use  . Smoking status: Former Smoker    Packs/day: 0.25    Years: 2.00    Pack years: 0.50    Types: Cigarettes    Quit date: 04/26/2011    Years since quitting: 8.1  . Smokeless tobacco: Never Used  . Tobacco comment: more socially   Substance and Sexual Activity  . Alcohol use: No  . Drug use: No  . Sexual activity: Not Currently    Birth control/protection: Pill  Other Topics Concern  . Not on file  Social History Narrative  . Not on file   Social Determinants of Health   Financial Resource Strain:   . Difficulty of Paying Living Expenses:   Food Insecurity:   . Worried About Programme researcher, broadcasting/film/video in the Last Year:   . Barista in the Last Year:   Transportation Needs:   . Freight forwarder (Medical):   Marland Kitchen Lack of Transportation (Non-Medical):   Physical Activity:   . Days of Exercise per Week:   . Minutes of Exercise per Session:   Stress:   . Feeling of Stress :   Social Connections:   . Frequency of Communication with Friends and Family:   . Frequency of Social Gatherings with Friends and Family:   . Attends Religious Services:   . Active Member of Clubs or Organizations:   . Attends Banker Meetings:   Marland Kitchen Marital Status:     Past Medical History:  Diagnosis Date  .  Anemia   . Heart murmur    SAW CARDIOLOGIST IN 2011 AND EVERYTHING WAS FINE PER PT-  ASYPMTOMATIC  . Hypertension    Chronic  . Migraine    migraines  . Vaginal Pap smear, abnormal    LGSIL     Patient Active Problem List   Diagnosis Date Noted  . Allergic rhinitis 11/15/2018  . Bilateral knee swelling 10/04/2018  . Postpartum anxiety 09/05/2018  . Migraine with aura and without status migrainosus, not intractable 08/23/2018  . History of abnormal cervical Pap  smear 08/23/2018  . Hypertension 08/22/2018    Past Surgical History:  Procedure Laterality Date  . DILATION AND EVACUATION  04/27/2017   Procedure: DILATATION AND EVACUATION;  Surgeon: Nadara Mustard, MD;  Location: [facility] ORS;  Service: Gynecology;;  . KNEE SURGERY    . TONSILLECTOMY    . WISDOM TOOTH EXTRACTION      Family History        Family Status  Relation Name Status  . Mat Aunt  (Not Specified)  . MGM  (Not Specified)  . MGF  (Not Specified)  . Mother  Alive  . Father  Alive  . Son  (Not Specified)  . Neg Hx  (Not Specified)        Her family history includes Diabetes in her maternal grandfather; Kidney disease in her mother and son; Melanoma (age of onset: 58) in her maternal aunt; Melanoma (age of onset: 41) in her maternal grandmother. There is no history of Breast cancer or Colon cancer.      Allergies  Allergen Reactions  . Coconut Flavor Hives and Itching  . Sulfa Antibiotics Hives and Other (See Comments)    Told by mother    . Coconut Oil Rash  . Codeine Rash and Other (See Comments)    Been told by mother    . Codone [Hydrocodone] Anxiety     Current Outpatient Medications:  .  amLODipine (NORVASC) 10 MG tablet, TAKE 1 TABLET BY MOUTH EVERY DAY, Disp: 90 tablet, Rfl: 0 .  norethindrone (MICRONOR) 0.35 MG tablet, TAKE 1 TABLET BY MOUTH EVERY DAY, Disp: 84 tablet, Rfl: 3 .  sertraline (ZOLOFT) 100 MG tablet, Take 1.5 tablets (150 mg total) by mouth daily., Disp: 135 tablet, Rfl: 3 .  amoxicillin (AMOXIL) 875 MG tablet, Take 1 tablet (875 mg total) by mouth 2 (two) times daily. (Patient not taking: Reported on 06/27/2019), Disp: 20 tablet, Rfl: 0 .  prenatal vitamin w/FE, FA (PRENATAL 1 + 1) 27-1 MG TABS tablet, Take 1 tablet by mouth every evening. , Disp: , Rfl:    Patient Care Team: Erasmo Downer, MD as PCP - General (Family Medicine)    Objective:    Vitals: BP 125/78 (BP Location: Left Arm, Patient Position: Sitting, Cuff Size: Normal)    Pulse 78   Temp (!) 96.8 F (36 C) (Temporal)   Wt 163 lb (73.9 kg)   SpO2 97%   BMI 27.12 kg/m    Vitals:   06/27/19 1510  BP: 125/78  Pulse: 78  Temp: (!) 96.8 F (36 C)  TempSrc: Temporal  SpO2: 97%  Weight: 163 lb (73.9 kg)     Physical Exam Vitals reviewed.  Constitutional:      General: She is not in acute distress.    Appearance: Normal appearance. She is well-developed. She is not diaphoretic.  HENT:     Head: Normocephalic and atraumatic.     Right Ear: External ear normal.  Left Ear: External ear normal.     Mouth/Throat:     Pharynx: Oropharynx is clear. No oropharyngeal exudate.  Eyes:     General: No scleral icterus.    Conjunctiva/sclera: Conjunctivae normal.     Pupils: Pupils are equal, round, and reactive to light.  Neck:     Thyroid: No thyromegaly.  Cardiovascular:     Rate and Rhythm: Normal rate and regular rhythm.     Pulses: Normal pulses.     Heart sounds: Normal heart sounds. No murmur.  Pulmonary:     Effort: Pulmonary effort is normal. No respiratory distress.     Breath sounds: Normal breath sounds. No wheezing or rales.  Abdominal:     General: There is no distension.     Palpations: Abdomen is soft.     Tenderness: There is no abdominal tenderness.  Musculoskeletal:        General: No deformity.     Cervical back: Neck supple.     Right lower leg: No edema.     Left lower leg: No edema.  Lymphadenopathy:     Cervical: No cervical adenopathy.  Skin:    General: Skin is warm and dry.     Findings: No rash.  Neurological:     Mental Status: She is alert and oriented to person, place, and time. Mental status is at baseline.  Psychiatric:        Mood and Affect: Mood normal.        Behavior: Behavior normal.        Thought Content: Thought content normal.      Depression Screen PHQ 2/9 Scores 06/27/2019 12/27/2018 11/15/2018 10/04/2018  PHQ - 2 Score 1 1 2  0  PHQ- 9 Score 2 2 5  -       Assessment & Plan:     Routine  Health Maintenance and Physical Exam  Exercise Activities and Dietary recommendations Goals   None     Immunization History  Administered Date(s) Administered  . Influenza,inj,Quad PF,6+ Mos 12/29/2017  . Tdap 05/14/2018  . Varicella 03/18/2016, 07/05/2018    Health Maintenance  Topic Date Due  . INFLUENZA VACCINE  10/27/2019  . PAP SMEAR-Modifier  08/15/2021  . TETANUS/TDAP  05/14/2028  . HIV Screening  Completed     Discussed health benefits of physical activity, and encouraged her to engage in regular exercise appropriate for her age and condition.    --------------------------------------------------------------------  Problem List Items Addressed This Visit      Cardiovascular and Mediastinum   Hypertension    Well controlled Continue current medications Recheck metabolic panel F/u in 6 months       Relevant Orders   Comprehensive metabolic panel    Other Visit Diagnoses    Encounter for annual physical exam    -  Primary   Relevant Orders   Lipid panel   Comprehensive metabolic panel   CBC w/Diff/Platelet   Anemia, unspecified type       Relevant Orders   CBC w/Diff/Platelet       Return in about 6 months (around 12/27/2019) for chronic disease f/u.   The entirety of the information documented in the History of Present Illness, Review of Systems and Physical Exam were personally obtained by me. Portions of this information were initially documented by Page Spiro, [place] student and reviewed by me for thoroughness and accuracy.    Tarrie Mcmichen, Dionne Bucy, MD MPH [facility]

## 2019-06-27 NOTE — Patient Instructions (Signed)
Preventive Care 21-35 Years Old, Female Preventive care refers to visits with your health care provider and lifestyle choices that can promote health and wellness. This includes:  A yearly physical exam. This may also be called an annual well check.  Regular dental visits and eye exams.  Immunizations.  Screening for certain conditions.  Healthy lifestyle choices, such as eating a healthy diet, getting regular exercise, not using drugs or products that contain nicotine and tobacco, and limiting alcohol use. What can I expect for my preventive care visit? Physical exam Your health care provider will check your:  Height and weight. This may be used to calculate body mass index (BMI), which tells if you are at a healthy weight.  Heart rate and blood pressure.  Skin for abnormal spots. Counseling Your health care provider may ask you questions about your:  Alcohol, tobacco, and drug use.  Emotional well-being.  Home and relationship well-being.  Sexual activity.  Eating habits.  Work and work environment.  Method of birth control.  Menstrual cycle.  Pregnancy history. What immunizations do I need?  Influenza (flu) vaccine  This is recommended every year. Tetanus, diphtheria, and pertussis (Tdap) vaccine  You may need a Td booster every 10 years. Varicella (chickenpox) vaccine  You may need this if you have not been vaccinated. Human papillomavirus (HPV) vaccine  If recommended by your health care provider, you may need three doses over 6 months. Measles, mumps, and rubella (MMR) vaccine  You may need at least one dose of MMR. You may also need a second dose. Meningococcal conjugate (MenACWY) vaccine  One dose is recommended if you are age 19-21 years and a first-year college student living in a residence hall, or if you have one of several medical conditions. You may also need additional booster doses. Pneumococcal conjugate (PCV13) vaccine  You may need  this if you have certain conditions and were not previously vaccinated. Pneumococcal polysaccharide (PPSV23) vaccine  You may need one or two doses if you smoke cigarettes or if you have certain conditions. Hepatitis A vaccine  You may need this if you have certain conditions or if you travel or work in places where you may be exposed to hepatitis A. Hepatitis B vaccine  You may need this if you have certain conditions or if you travel or work in places where you may be exposed to hepatitis B. Haemophilus influenzae type b (Hib) vaccine  You may need this if you have certain conditions. You may receive vaccines as individual doses or as more than one vaccine together in one shot (combination vaccines). Talk with your health care provider about the risks and benefits of combination vaccines. What tests do I need?  Blood tests  Lipid and cholesterol levels. These may be checked every 5 years starting at age 20.  Hepatitis C test.  Hepatitis B test. Screening  Diabetes screening. This is done by checking your blood sugar (glucose) after you have not eaten for a while (fasting).  Sexually transmitted disease (STD) testing.  BRCA-related cancer screening. This may be done if you have a family history of breast, ovarian, tubal, or peritoneal cancers.  Pelvic exam and Pap test. This may be done every 3 years starting at age 21. Starting at age 30, this may be done every 5 years if you have a Pap test in combination with an HPV test. Talk with your health care provider about your test results, treatment options, and if necessary, the need for more tests.   Follow these instructions at home: Eating and drinking   Eat a diet that includes fresh fruits and vegetables, whole grains, lean protein, and low-fat dairy.  Take vitamin and mineral supplements as recommended by your health care provider.  Do not drink alcohol if: ? Your health care provider tells you not to drink. ? You are  pregnant, may be pregnant, or are planning to become pregnant.  If you drink alcohol: ? Limit how much you have to 0-1 drink a day. ? Be aware of how much alcohol is in your drink. In the [place] one drink equals one 12 oz bottle of beer (355 mL), one 5 oz glass of wine (148 mL), or one 1 oz glass of hard liquor (44 mL). Lifestyle  Take daily care of your teeth and gums.  Stay active. Exercise for at least 30 minutes on 5 or more days each week.  Do not use any products that contain nicotine or tobacco, such as cigarettes, e-cigarettes, and chewing tobacco. If you need help quitting, ask your health care provider.  If you are sexually active, practice safe sex. Use a condom or other form of birth control (contraception) in order to prevent pregnancy and STIs (sexually transmitted infections). If you plan to become pregnant, see your health care provider for a preconception visit. What's next?  Visit your health care provider once a year for a well check visit.  Ask your health care provider how often you should have your eyes and teeth checked.  Stay up to date on all vaccines. This information is not intended to replace advice given to you by your health care provider. Make sure you discuss any questions you have with your health care provider. Document Revised: 11/23/2017 Document Reviewed: 11/23/2017 Elsevier Patient Education  2020 [employer].

## 2019-06-28 ENCOUNTER — Telehealth: Payer: Self-pay

## 2019-06-28 LAB — LIPID PANEL
Chol/HDL Ratio: 4.3 ratio (ref 0.0–4.4)
Cholesterol, Total: 197 mg/dL (ref 100–199)
HDL: 46 mg/dL (ref >39)
LDL Chol Calc (NIH): 115 mg/dL — ABNORMAL HIGH (ref 0–99)
Triglycerides: 204 mg/dL — ABNORMAL HIGH (ref 0–149)
VLDL Cholesterol Cal: 36 mg/dL (ref 5–40)

## 2019-06-28 LAB — COMPREHENSIVE METABOLIC PANEL
ALT: 13 IU/L (ref 0–32)
AST: 14 IU/L (ref 0–40)
Albumin/Globulin Ratio: 2.5 — ABNORMAL HIGH (ref 1.2–2.2)
Albumin: 4.8 g/dL (ref 3.8–4.8)
Alkaline Phosphatase: 108 IU/L (ref 39–117)
BUN/Creatinine Ratio: 23 (ref 9–23)
BUN: 20 mg/dL (ref 6–20)
Bilirubin Total: 0.3 mg/dL (ref 0.0–1.2)
CO2: 23 mmol/L (ref 20–29)
Calcium: 9 mg/dL (ref 8.7–10.2)
Chloride: 104 mmol/L (ref 96–106)
Creatinine, Ser: 0.88 mg/dL (ref 0.57–1.00)
GFR calc Af Amer: 98 mL/min/{1.73_m2} (ref >59)
GFR calc non Af Amer: 85 mL/min/{1.73_m2} (ref >59)
Globulin, Total: 1.9 g/dL (ref 1.5–4.5)
Glucose: 84 mg/dL (ref 65–99)
Potassium: 4.1 mmol/L (ref 3.5–5.2)
Sodium: 142 mmol/L (ref 134–144)
Total Protein: 6.7 g/dL (ref 6.0–8.5)

## 2019-06-28 LAB — CBC WITH DIFFERENTIAL/PLATELET
Basophils Absolute: 0.1 10*3/uL (ref 0.0–0.2)
Basos: 1 %
EOS (ABSOLUTE): 0.2 10*3/uL (ref 0.0–0.4)
Eos: 3 %
Hematocrit: 36.3 % (ref 34.0–46.6)
Hemoglobin: 12.7 g/dL (ref 11.1–15.9)
Immature Grans (Abs): 0 10*3/uL (ref 0.0–0.1)
Immature Granulocytes: 1 %
Lymphocytes Absolute: 1 10*3/uL (ref 0.7–3.1)
Lymphs: 17 %
MCH: 28.3 pg (ref 26.6–33.0)
MCHC: 35 g/dL (ref 31.5–35.7)
MCV: 81 fL (ref 79–97)
Monocytes Absolute: 0.5 10*3/uL (ref 0.1–0.9)
Monocytes: 9 %
Neutrophils Absolute: 3.9 10*3/uL (ref 1.4–7.0)
Neutrophils: 69 %
Platelets: 240 10*3/uL (ref 150–450)
RBC: 4.48 x10E6/uL (ref 3.77–5.28)
RDW: 12.1 % (ref 11.7–15.4)
WBC: 5.6 10*3/uL (ref 3.4–10.8)

## 2019-06-28 NOTE — Telephone Encounter (Signed)
[employer]   Result Notes and Comments to Patient Comment seen by patient Angela Weaver on 06/28/2019 9:19 AM EDT

## 2019-06-28 NOTE — Telephone Encounter (Signed)
-----   Message from Erasmo Downer, MD sent at 06/28/2019  9:05 AM EDT ----- Normal labs

## 2019-07-11 ENCOUNTER — Telehealth: Payer: Self-pay

## 2019-07-11 ENCOUNTER — Encounter: Payer: Self-pay | Admitting: Family Medicine

## 2019-07-11 ENCOUNTER — Ambulatory Visit (INDEPENDENT_AMBULATORY_CARE_PROVIDER_SITE_OTHER): Payer: BC Managed Care – PPO | Admitting: Family Medicine

## 2019-07-11 VITALS — Wt 157.0 lb

## 2019-07-11 DIAGNOSIS — J0101 Acute recurrent maxillary sinusitis: Secondary | ICD-10-CM | POA: Diagnosis not present

## 2019-07-11 MED ORDER — AMOXICILLIN-POT CLAVULANATE 875-125 MG PO TABS: 1.0000 | ORAL_TABLET | Freq: Two times a day (BID) | ORAL | 0 refills | Status: AC

## 2019-07-11 NOTE — Patient Instructions (Signed)
Sinusitis, Adult Sinusitis is inflammation of your sinuses. Sinuses are hollow spaces in the bones around your face. Your sinuses are located:  Around your eyes.  In the middle of your forehead.  Behind your nose.  In your cheekbones. Mucus normally drains out of your sinuses. When your nasal tissues become inflamed or swollen, mucus can become trapped or blocked. This allows bacteria, viruses, and fungi to grow, which leads to infection. Most infections of the sinuses are caused by a virus. Sinusitis can develop quickly. It can last for up to 4 weeks (acute) or for more than 12 weeks (chronic). Sinusitis often develops after a cold. What are the causes? This condition is caused by anything that creates swelling in the sinuses or stops mucus from draining. This includes:  Allergies.  Asthma.  Infection from bacteria or viruses.  Deformities or blockages in your nose or sinuses.  Abnormal growths in the nose (nasal polyps).  Pollutants, such as chemicals or irritants in the air.  Infection from fungi (rare). What increases the risk? You are more likely to develop this condition if you:  Have a weak body defense system (immune system).  Do a lot of swimming or diving.  Overuse nasal sprays.  Smoke. What are the signs or symptoms? The main symptoms of this condition are pain and a feeling of pressure around the affected sinuses. Other symptoms include:  Stuffy nose or congestion.  Thick drainage from your nose.  Swelling and warmth over the affected sinuses.  Headache.  Upper toothache.  A cough that may get worse at night.  Extra mucus that collects in the throat or the back of the nose (postnasal drip).  Decreased sense of smell and taste.  Fatigue.  A fever.  Sore throat.  Bad breath. How is this diagnosed? This condition is diagnosed based on:  Your symptoms.  Your medical history.  A physical exam.  Tests to find out if your condition is  acute or chronic. This may include: ? Checking your nose for nasal polyps. ? Viewing your sinuses using a device that has a light (endoscope). ? Testing for allergies or bacteria. ? Imaging tests, such as an MRI or CT scan. In rare cases, a bone biopsy may be done to rule out more serious types of fungal sinus disease. How is this treated? Treatment for sinusitis depends on the cause and whether your condition is chronic or acute.  If caused by a virus, your symptoms should go away on their own within 10 days. You may be given medicines to relieve symptoms. They include: ? Medicines that shrink swollen nasal passages (topical intranasal decongestants). ? Medicines that treat allergies (antihistamines). ? A spray that eases inflammation of the nostrils (topical intranasal corticosteroids). ? Rinses that help get rid of thick mucus in your nose (nasal saline washes).  If caused by bacteria, your health care provider may recommend waiting to see if your symptoms improve. Most bacterial infections will get better without antibiotic medicine. You may be given antibiotics if you have: ? A severe infection. ? A weak immune system.  If caused by narrow nasal passages or nasal polyps, you may need to have surgery. Follow these instructions at home: Medicines  Take, use, or apply over-the-counter and prescription medicines only as told by your health care provider. These may include nasal sprays.  If you were prescribed an antibiotic medicine, take it as told by your health care provider. Do not stop taking the antibiotic even if you start   to feel better. Hydrate and humidify   Drink enough fluid to keep your urine pale yellow. Staying hydrated will help to thin your mucus.  Use a cool mist humidifier to keep the humidity level in your home above 50%.  Inhale steam for 10-15 minutes, 3-4 times a day, or as told by your health care provider. You can do this in the bathroom while a hot shower is  running.  Limit your exposure to cool or dry air. Rest  Rest as much as possible.  Sleep with your head raised (elevated).  Make sure you get enough sleep each night. General instructions   Apply a warm, moist washcloth to your face 3-4 times a day or as told by your health care provider. This will help with discomfort.  Wash your hands often with soap and water to reduce your exposure to germs. If soap and water are not available, use hand sanitizer.  Do not smoke. Avoid being around people who are smoking (secondhand smoke).  Keep all follow-up visits as told by your health care provider. This is important. Contact a health care provider if:  You have a fever.  Your symptoms get worse.  Your symptoms do not improve within 10 days. Get help right away if:  You have a severe headache.  You have persistent vomiting.  You have severe pain or swelling around your face or eyes.  You have vision problems.  You develop confusion.  Your neck is stiff.  You have trouble breathing. Summary  Sinusitis is soreness and inflammation of your sinuses. Sinuses are hollow spaces in the bones around your face.  This condition is caused by nasal tissues that become inflamed or swollen. The swelling traps or blocks the flow of mucus. This allows bacteria, viruses, and fungi to grow, which leads to infection.  If you were prescribed an antibiotic medicine, take it as told by your health care provider. Do not stop taking the antibiotic even if you start to feel better.  Keep all follow-up visits as told by your health care provider. This is important. This information is not intended to replace advice given to you by your health care provider. Make sure you discuss any questions you have with your health care provider. Document Revised: 08/14/2017 Document Reviewed: 08/14/2017 Elsevier Patient Education  2020 [employer]  

## 2019-07-11 NOTE — Telephone Encounter (Signed)
Copied from CRM [id number]. Topic: General - Other >> Jul 11, 2019 11:09 AM Marylen Ponto wrote: Reason for CRM: Pt returned call to office from a missed call she had. Pt requests call back.

## 2019-07-11 NOTE — Progress Notes (Signed)
Established patient visit    Patient: Angela Weaver   DOB: December 29, 1985   34 y.o. Female  MRN: [id number] Visit Date: 07/11/2019  I,Sulibeya S Dimas,acting as a scribe for Lavon Paganini, MD.,have documented all relevant documentation on the behalf of Lavon Paganini, MD,as directed by  Lavon Paganini, MD while in the presence of Lavon Paganini, MD.  Today's healthcare provider: Lavon Paganini, MD  Subjective:    Chief Complaint  Patient presents with  . Sinusitis   Virtual Visit via Telephone Note  I connected with Boykin Reaper on 07/11/19 at  1:20 PM EDT by telephone and verified that I am speaking with the correct person using two identifiers.  Location: Patient location: home Provider location: [facility] Persons involved in the visit: patient, provider   I discussed the limitations, risks, security and privacy concerns of performing an evaluation and management service by telephone and the availability of in person appointments. I also discussed with the patient that there may be a patient responsible charge related to this service. The patient expressed understanding and agreed to proceed.   Sinusitis This is a new problem. The current episode started in the past 7 days. The problem has been gradually worsening since onset. There has been no fever. The pain is mild. Associated symptoms include congestion, ear pain, headaches, sinus pressure and sneezing. Pertinent negatives include no chills, coughing, diaphoresis, hoarse voice, neck pain, shortness of breath, sore throat or swollen glands. Past treatments include spray decongestants and acetaminophen. The treatment provided no relief.   Initially started with allergy congestion Significantly worsened this week Feels like previous sinus infections   Social History   Tobacco Use  . Smoking status: Former Smoker    Packs/day: 0.25    Years: 2.00    Pack years: 0.50    Types:  Cigarettes    Quit date: 04/26/2011    Years since quitting: 8.2  . Smokeless tobacco: Never Used  . Tobacco comment: more socially   Substance Use Topics  . Alcohol use: No  . Drug use: No       Medications: Outpatient Medications Prior to Visit  Medication Sig  . amLODipine (NORVASC) 10 MG tablet TAKE 1 TABLET BY MOUTH EVERY DAY  . cetirizine (ZYRTEC) 10 MG tablet Take 10 mg by mouth daily.  . mometasone (NASONEX) 50 MCG/ACT nasal spray Place 2 sprays into the nose daily.  . norethindrone (MICRONOR) 0.35 MG tablet TAKE 1 TABLET BY MOUTH EVERY DAY  . sertraline (ZOLOFT) 100 MG tablet Take 1.5 tablets (150 mg total) by mouth daily.  . [DISCONTINUED] prenatal vitamin w/FE, FA (PRENATAL 1 + 1) 27-1 MG TABS tablet Take 1 tablet by mouth every evening.    No facility-administered medications prior to visit.    Review of Systems  Constitutional: Negative for chills, diaphoresis and fever.  HENT: Positive for congestion, ear pain, sinus pressure and sneezing. Negative for hoarse voice and sore throat.   Respiratory: Negative for cough and shortness of breath.   Musculoskeletal: Negative for neck pain.  Neurological: Positive for headaches.        Objective:    Wt 157 lb (71.2 kg)   Breastfeeding Yes   BMI 26.13 kg/m    Physical Exam  Speaking in full sentences in NAD  No results found for any visits on 07/11/19.    Assessment & Plan:    I discussed the assessment and treatment plan with the patient. The patient was provided an  opportunity to ask questions and all were answered. The patient agreed with the plan and demonstrated an understanding of the instructions.   The patient was advised to call back or seek an in-person evaluation if the symptoms worsen or if the condition fails to improve as anticipated.  1. Acute recurrent maxillary sinusitis - symptomsc/w sinusitis   - nosymptoms  of AOM, CAP, strep pharyngitis, or other infection - given duration of symptoms,  suspect bacterial etiology - will treat with Augmentin x7d - discussed symptomatic management (flonase, antihistamine, etc), natural course, and return precautions   - as she is breastfeeding avoid NSAIDs and decongestants as these can depress milk supply  Other orders - cetirizine (ZYRTEC) 10 MG tablet; Take 10 mg by mouth daily. - mometasone (NASONEX) 50 MCG/ACT nasal spray; Place 2 sprays into the nose daily. - amoxicillin-clavulanate (AUGMENTIN) 875-125 MG tablet; Take 1 tablet by mouth 2 (two) times daily for 7 days.  Dispense: 14 tablet; Refill: 0    Return if symptoms worsen or fail to improve.   The entirety of the information documented in the History of Present Illness, Review of Systems and Physical Exam were personally obtained by me. Portions of this information were initially documented by Rondel Baton , CMA and reviewed by me for thoroughness and accuracy.    Cerys Winget, Marzella Schlein, MD MPH [facility]

## 2019-07-14 ENCOUNTER — Encounter: Payer: Self-pay | Admitting: Family Medicine

## 2019-07-15 MED ORDER — FLUCONAZOLE 150 MG PO TABS: 150.0000 mg | ORAL_TABLET | Freq: Once | ORAL | 0 refills | Status: AC

## 2019-07-30 ENCOUNTER — Encounter: Payer: Self-pay | Admitting: Family Medicine

## 2019-08-19 ENCOUNTER — Ambulatory Visit: Payer: BC Managed Care – PPO | Admitting: Obstetrics & Gynecology

## 2019-08-21 ENCOUNTER — Other Ambulatory Visit: Payer: Self-pay | Admitting: Family Medicine

## 2019-08-21 NOTE — Telephone Encounter (Signed)
Requested Prescriptions  Pending Prescriptions Disp Refills  . amLODipine (NORVASC) 10 MG tablet [Pharmacy Med Name: AMLODIPINE BESYLATE 10 MG TAB] 90 tablet 1    Sig: Take one tablet by mouth everyday.     Cardiovascular:  Calcium Channel Blockers Passed - 08/21/2019  1:23 AM      Passed - Last BP in normal range    BP Readings from Last 1 Encounters:  06/27/19 125/78         Passed - Valid encounter within last 6 months    Recent Outpatient Visits          1 month ago Acute recurrent maxillary sinusitis   [facility] [place], Marzella Schlein, MD   1 month ago Encounter for annual physical exam   [facility] [place], Marzella Schlein, MD   7 months ago Postpartum anxiety   [facility] [place], Marzella Schlein, MD   8 months ago Earache on right   [facility], [place], [place]   9 months ago Postpartum anxiety   [facility] [place], Marzella Schlein, MD      Future Appointments            In 4 months Bac[place]al[place], Marzella Schlein, MD [facility]

## 2019-08-23 ENCOUNTER — Other Ambulatory Visit: Payer: Self-pay

## 2019-08-23 ENCOUNTER — Encounter: Payer: Self-pay | Admitting: Obstetrics & Gynecology

## 2019-08-23 ENCOUNTER — Ambulatory Visit (INDEPENDENT_AMBULATORY_CARE_PROVIDER_SITE_OTHER): Payer: BC Managed Care – PPO | Admitting: Obstetrics & Gynecology

## 2019-08-23 VITALS — BP 120/80 | Ht 65.5 in | Wt 163.0 lb

## 2019-08-23 DIAGNOSIS — Z01419 Encounter for gynecological examination (general) (routine) without abnormal findings: Secondary | ICD-10-CM

## 2019-08-23 MED ORDER — LO LOESTRIN FE 1 MG-10 MCG / 10 MCG PO TABS: 1.0000 | ORAL_TABLET | Freq: Every day | ORAL | 3 refills | Status: DC

## 2019-08-23 NOTE — Progress Notes (Signed)
HPI:      [name] is a 34 y.o. [id number] who LMP was No LMP recorded., she presents today for her annual examination. The patient has no complaints today. The patient is sexually active. Her last pap: approximate date 2020 and was normal. The patient does perform self breast exams.  There is no notable family history of breast or ovarian cancer in her family.  The patient has regular exercise: yes.  The patient denies current symptoms of depression.    GYN History: Contraception: oral progesterone-only contraceptive  PMHx: Past Medical History:  Diagnosis Date  . Anemia   . Heart murmur    SAW CARDIOLOGIST IN 2011 AND EVERYTHING WAS FINE PER PT-  ASYPMTOMATIC  . Hypertension    Chronic  . Migraine    migraines  . Vaginal Pap smear, abnormal    LGSIL   Past Surgical History:  Procedure Laterality Date  . DILATION AND EVACUATION  04/27/2017   Procedure: DILATATION AND EVACUATION;  Surgeon: Nadara Mustard, MD;  Location: [facility] ORS;  Service: Gynecology;;  . KNEE SURGERY    . TONSILLECTOMY    . WISDOM TOOTH EXTRACTION     Family History  Problem Relation Age of Onset  . Melanoma Maternal Aunt 50  . Melanoma Maternal Grandmother 72  . Diabetes Maternal Grandfather   . Kidney disease Mother   . Kidney disease Son        produces a lot of crystal and kidney stones  . Breast cancer Neg Hx   . Colon cancer Neg Hx    Social History   Tobacco Use  . Smoking status: Former Smoker    Packs/day: 0.25    Years: 2.00    Pack years: 0.50    Types: Cigarettes    Quit date: 04/26/2011    Years since quitting: 8.3  . Smokeless tobacco: Never Used  . Tobacco comment: more socially   Substance Use Topics  . Alcohol use: No  . Drug use: No    Current Outpatient Medications:  .  amLODipine (NORVASC) 10 MG tablet, Take one tablet by mouth everyday., Disp: 90 tablet, Rfl: 1 .  cetirizine (ZYRTEC) 10 MG tablet, Take 10 mg by mouth daily., Disp: , Rfl:  .  mometasone  (NASONEX) 50 MCG/ACT nasal spray, Place 2 sprays into the nose daily., Disp: , Rfl:  Allergies: Coconut flavor, Sulfa antibiotics, Coconut oil, Codeine, and Codone [hydrocodone]  Review of Systems  Constitutional: Negative for chills, fever and malaise/fatigue.  HENT: Negative for congestion, sinus pain and sore throat.   Eyes: Negative for blurred vision and pain.  Respiratory: Negative for cough and wheezing.   Cardiovascular: Negative for chest pain and leg swelling.  Gastrointestinal: Negative for abdominal pain, constipation, diarrhea, heartburn, nausea and vomiting.  Genitourinary: Negative for dysuria, frequency, hematuria and urgency.  Musculoskeletal: Negative for back pain, joint pain, myalgias and neck pain.  Skin: Negative for itching and rash.  Neurological: Negative for dizziness, tremors and weakness.  Endo/Heme/Allergies: Does not bruise/bleed easily.  Psychiatric/Behavioral: Negative for depression. The patient is not nervous/anxious and does not have insomnia.     Objective: BP 120/80   Ht 5' 5.5" (1.664 m)   Wt 163 lb (73.9 kg)   BMI 26.71 kg/m   Filed Weights   08/23/19 1023  Weight: 163 lb (73.9 kg)   Body mass index is 26.71 kg/m. Physical Exam Constitutional:      General: She is not in acute distress.  Appearance: She is well-developed.  Genitourinary:     Pelvic exam was performed with patient supine.     Vagina, uterus and rectum normal.     No lesions in the vagina.     No vaginal bleeding.     No cervical motion tenderness, friability, lesion or polyp.     Uterus is mobile.     Uterus is not enlarged.     No uterine mass detected.    Uterus is midaxial.     No right or left adnexal mass present.     Right adnexa not tender.     Left adnexa not tender.  HENT:     Head: Normocephalic and atraumatic. No laceration.     Right Ear: Hearing normal.     Left Ear: Hearing normal.     Mouth/Throat:     Pharynx: Uvula midline.  Eyes:      Pupils: Pupils are equal, round, and reactive to light.  Neck:     Thyroid: No thyromegaly.  Cardiovascular:     Rate and Rhythm: Normal rate and regular rhythm.     Heart sounds: No murmur. No friction rub. No gallop.   Pulmonary:     Effort: Pulmonary effort is normal. No respiratory distress.     Breath sounds: Normal breath sounds. No wheezing.  Chest:     Breasts:        Right: No mass, skin change or tenderness.        Left: No mass, skin change or tenderness.  Abdominal:     General: Bowel sounds are normal. There is no distension.     Palpations: Abdomen is soft.     Tenderness: There is no abdominal tenderness. There is no rebound.  Musculoskeletal:        General: Normal range of motion.     Cervical back: Normal range of motion and neck supple.  Neurological:     Mental Status: She is alert and oriented to person, place, and time.     Cranial Nerves: No cranial nerve deficit.  Skin:    General: Skin is warm and dry.  Psychiatric:        Judgment: Judgment normal.  Vitals reviewed.     Assessment:  ANNUAL EXAM 1. Women's annual routine gynecological examination      Screening Plan:            1.  Cervical Screening-  Pap smear schedule reviewed with patient  2. Breast screening- Exam annually and mammogram>40 planned   3. Colonoscopy every 10 years, Hemoccult testing - after age 45  4. Labs managed by PCP  5. Counseling for contraception: oral contraceptives (estrogen/progesterone) Change to Lo Loestrin today Stopping breast feeding now    F/U  Return in about 1 year (around 08/22/2020) for Annual.  Barnett Applebaum, MD, [facility], [facility] 08/23/2019  10:48 AM

## 2019-08-23 NOTE — Patient Instructions (Signed)
Ethinyl Estradiol; Norethindrone Acetate; Ferrous fumarate tablets or capsules What is this medicine? ETHINYL ESTRADIOL; NORETHINDRONE ACETATE; FERROUS FUMARATE (ETH in il es tra DYE ole; nor eth IN drone AS e tate; FER us FUE ma rate) is an oral contraceptive. The products combine two types of female hormones, an estrogen and a progestin. They are used to prevent ovulation and pregnancy. Some products are also used to treat acne in females. This medicine may be used for other purposes; ask your health care provider or pharmacist if you have questions. COMMON BRAND NAME(S): Aurovela 24 Fe 1/20, Aurovela Fe, Blisovi 24 Fe, Blisovi Fe, Estrostep Fe, Gildess 24 Fe, Gildess Fe 1.5/30, Gildess Fe 1/20, Hailey 24 Fe, Hailey Fe 1.5/30, Junel Fe 1.5/30, Junel Fe 1/20, Junel Fe 24, Larin Fe, Lo Loestrin Fe, Loestrin 24 Fe, Loestrin FE 1.5/30, Loestrin FE 1/20, Lomedia 24 Fe, Microgestin 24 Fe, Microgestin Fe 1.5/30, Microgestin Fe 1/20, Tarina 24 Fe, Tarina Fe 1/20, Taytulla, Tilia Fe, Tri-Legest Fe What should I tell my health care provider before I take this medicine? They need to know if you have any of these conditions:  abnormal vaginal bleeding  blood vessel disease  breast, cervical, endometrial, ovarian, liver, or uterine cancer  diabetes  gallbladder disease  heart disease or recent heart attack  high blood pressure  high cholesterol  history of blood clots  kidney disease  liver disease  migraine headaches  smoke tobacco  stroke  systemic lupus erythematosus (SLE)  an unusual or allergic reaction to estrogens, progestins, other medicines, foods, dyes, or preservatives  pregnant or trying to get pregnant  breast-feeding How should I use this medicine? Take this medicine by mouth. To reduce nausea, this medicine may be taken with food. Follow the directions on the prescription label. Take this medicine at the same time each day and in the order directed on the package. Do  not take your medicine more often than directed. A patient package insert for the product will be given with each prescription and refill. Read this sheet carefully each time. The sheet may change frequently. Contact your pediatrician regarding the use of this medicine in children. Special care may be needed. This medicine has been used in female children who have started having menstrual periods. Overdosage: If you think you have taken too much of this medicine contact a poison control center or emergency room at once. NOTE: This medicine is only for you. Do not share this medicine with others. What if I miss a dose? If you miss a dose, refer to the patient information sheet you received with your medicine for direction. If you miss more than one pill, this medicine may not be as effective and you may need to use another form of birth control. What may interact with this medicine? Do not take this medicine with the following medication:  dasabuvir; ombitasvir; paritaprevir; ritonavir  ombitasvir; paritaprevir; ritonavir This medicine may also interact with the following medications:  acetaminophen  antibiotics or medicines for infections, especially rifampin, rifabutin, rifapentine, and griseofulvin, and possibly penicillins or tetracyclines  aprepitant  ascorbic acid (vitamin C)  atorvastatin  barbiturate medicines, such as phenobarbital  bosentan  carbamazepine  caffeine  clofibrate  cyclosporine  dantrolene  doxercalciferol  felbamate  grapefruit juice  hydrocortisone  medicines for anxiety or sleeping problems, such as diazepam or temazepam  medicines for diabetes, including pioglitazone  mineral oil  modafinil  mycophenolate  nefazodone  oxcarbazepine  phenytoin  prednisolone  ritonavir or other medicines for   HIV infection or AIDS  rosuvastatin  selegiline  soy isoflavones supplements  St. John's wort  tamoxifen or  raloxifene  theophylline  thyroid hormones  topiramate  warfarin This list may not describe all possible interactions. Give your health care provider a list of all the medicines, herbs, non-prescription drugs, or dietary supplements you use. Also tell them if you smoke, drink alcohol, or use illegal drugs. Some items may interact with your medicine. What should I watch for while using this medicine? Visit your doctor or health care professional for regular checks on your progress. You will need a regular breast and pelvic exam and Pap smear while on this medicine. Use an additional method of contraception during the first cycle that you take these tablets. If you have any reason to think you are pregnant, stop taking this medicine right away and contact your doctor or health care professional. If you are taking this medicine for hormone related problems, it may take several cycles of use to see improvement in your condition. Smoking increases the risk of getting a blood clot or having a stroke while you are taking birth control pills, especially if you are more than 35 years old. You are strongly advised not to smoke. This medicine can make your body retain fluid, making your fingers, hands, or ankles swell. Your blood pressure can go up. Contact your doctor or health care professional if you feel you are retaining fluid. This medicine can make you more sensitive to the sun. Keep out of the sun. If you cannot avoid being in the sun, wear protective clothing and use sunscreen. Do not use sun lamps or tanning beds/booths. If you wear contact lenses and notice visual changes, or if the lenses begin to feel uncomfortable, consult your eye care specialist. In some women, tenderness, swelling, or minor bleeding of the gums may occur. Notify your dentist if this happens. Brushing and flossing your teeth regularly may help limit this. See your dentist regularly and inform your dentist of the medicines you  are taking. If you are going to have elective surgery, you may need to stop taking this medicine before the surgery. Consult your health care professional for advice. This medicine does not protect you against HIV infection (AIDS) or any other sexually transmitted diseases. What side effects may I notice from receiving this medicine? Side effects that you should report to your doctor or health care professional as soon as possible:  allergic reactions like skin rash, itching or hives, swelling of the face, lips, or tongue  breast tissue changes or discharge  changes in vaginal bleeding during your period or between your periods  changes in vision  chest pain  confusion  coughing up blood  dizziness  feeling faint or lightheaded  headaches or migraines  leg, arm or groin pain  loss of balance or coordination  severe or sudden headaches  stomach pain (severe)  sudden shortness of breath  sudden numbness or weakness of the face, arm or leg  symptoms of vaginal infection like itching, irritation or unusual discharge  tenderness in the upper abdomen  trouble speaking or understanding  vomiting  yellowing of the eyes or skin Side effects that usually do not require medical attention (report to your doctor or health care professional if they continue or are bothersome):  breakthrough bleeding and spotting that continues beyond the 3 initial cycles of pills  breast tenderness  mood changes, anxiety, depression, frustration, anger, or emotional outbursts  increased sensitivity to sun   or ultraviolet light  nausea  skin rash, acne, or brown spots on the skin  weight gain (slight) This list may not describe all possible side effects. Call your doctor for medical advice about side effects. You may report side effects to FDA at 1-800-FDA-1088. Where should I keep my medicine? Keep out of the reach of children. Store at room temperature between 15 and 30 degrees C  (59 and 86 degrees F). Throw away any unused medicine after the expiration date. NOTE: This sheet is a summary. It may not cover all possible information. If you have questions about this medicine, talk to your doctor, pharmacist, or health care provider.  2020 Elsevier/Gold Standard (2015-11-23 08:04:41)  

## 2019-09-02 ENCOUNTER — Encounter: Payer: Self-pay | Admitting: Family Medicine

## 2019-10-01 ENCOUNTER — Other Ambulatory Visit: Payer: Self-pay | Admitting: Obstetrics & Gynecology

## 2019-10-22 ENCOUNTER — Other Ambulatory Visit: Payer: Self-pay | Admitting: Family Medicine

## 2019-10-22 NOTE — Telephone Encounter (Signed)
Patient called, left VM to return the call to the office to let us know if she's taking this medication. It is not on her profile.

## 2019-11-03 IMAGING — US LIMITED OBSTETRIC ULTRASOUND
1 series · 14 of 19 positions shown · non-contrast
Comparison: none

CLINICAL DATA: Late pregnancy, assess AFI, presenting hypertension;
assigned GA 35 weeks 5 days

EXAM:
LIMITED OBSTETRIC ULTRASOUND

[Series 1: limited obstetric ultrasound · 14 of 19 slices shown]
[im 1/19]
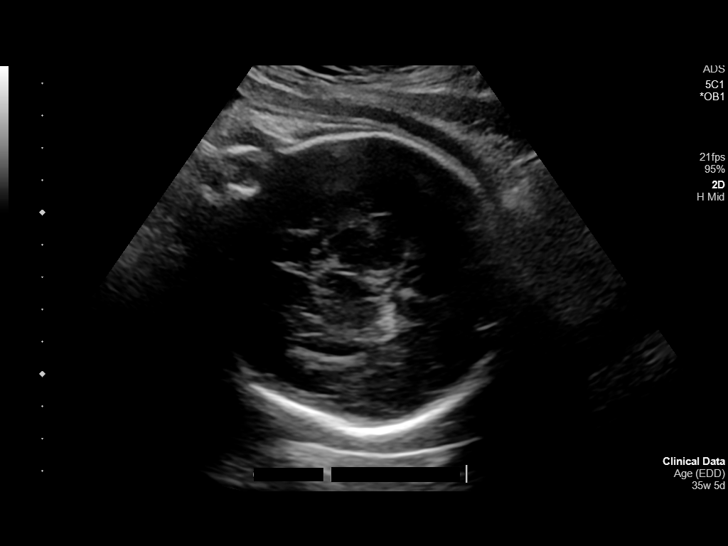
[im 3/19]
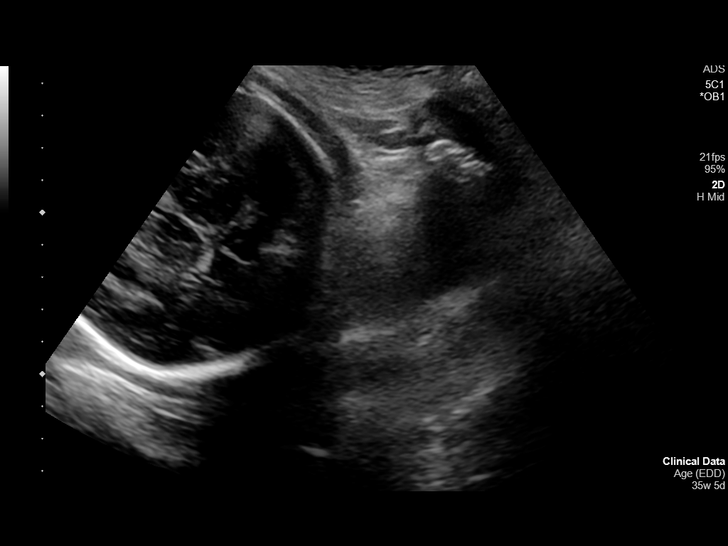
[im 4/19]
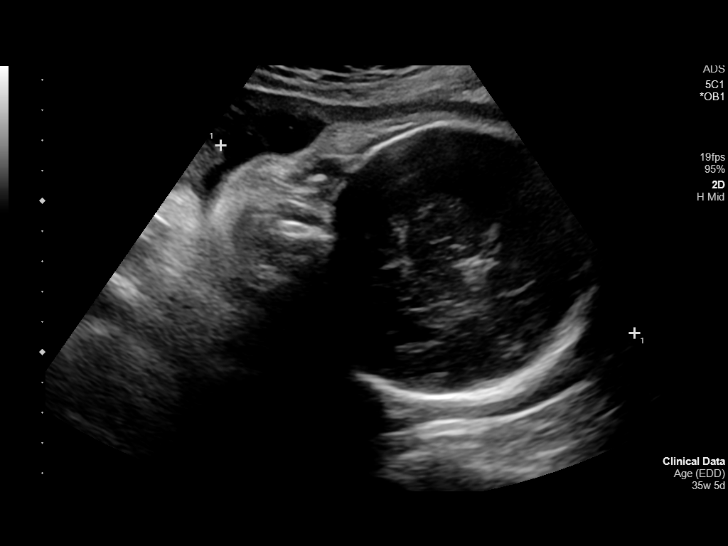
[im 5/19]
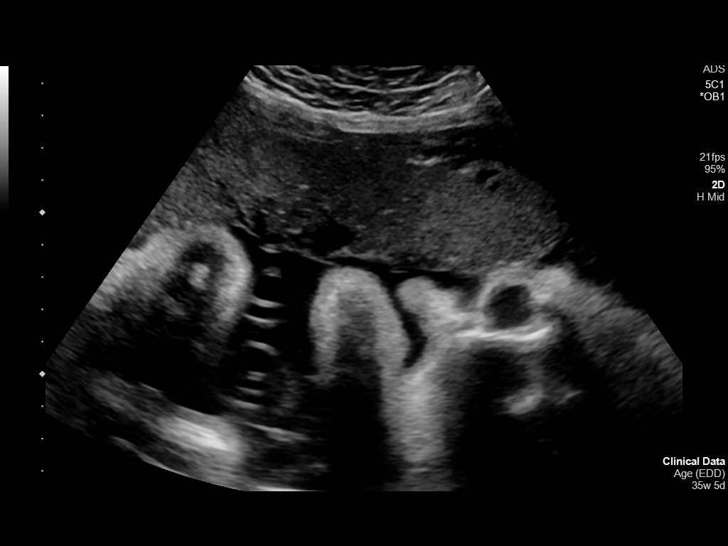
[im 7/19]
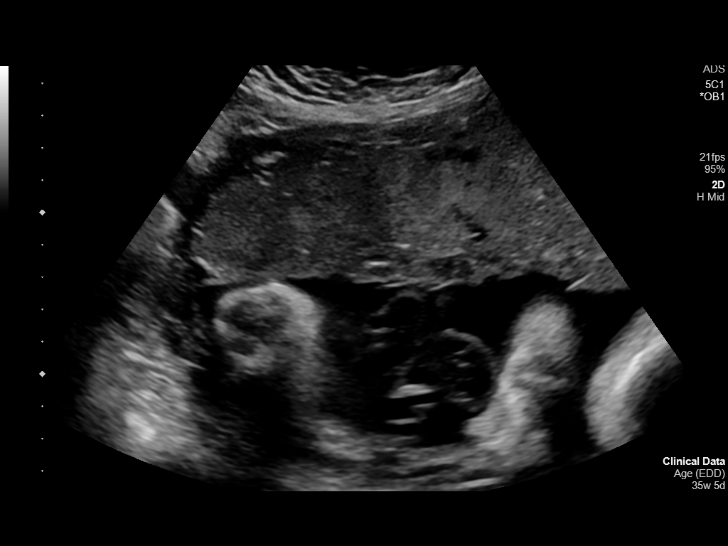
[im 8/19]
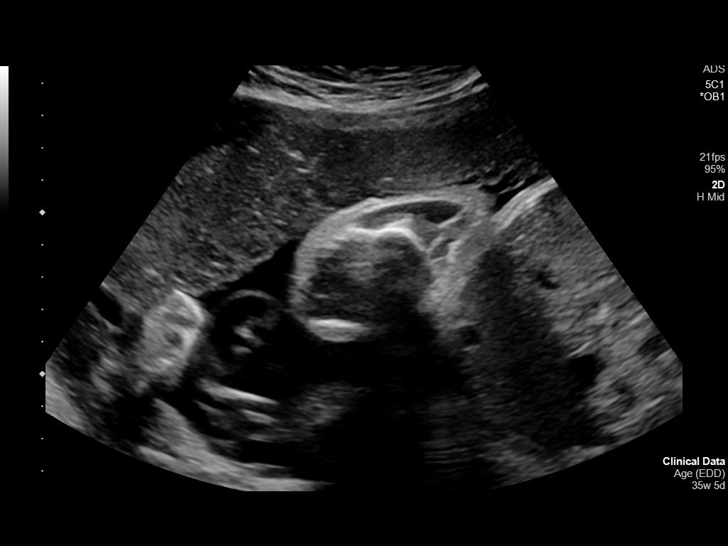
[im 9/19]
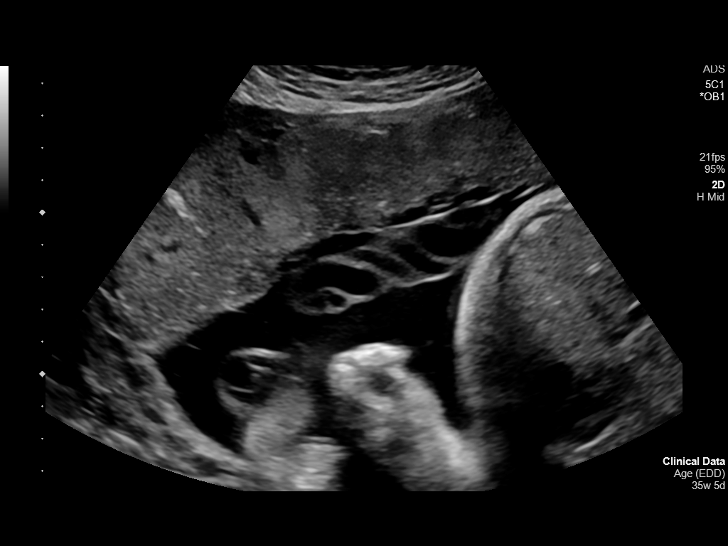
[im 11/19]
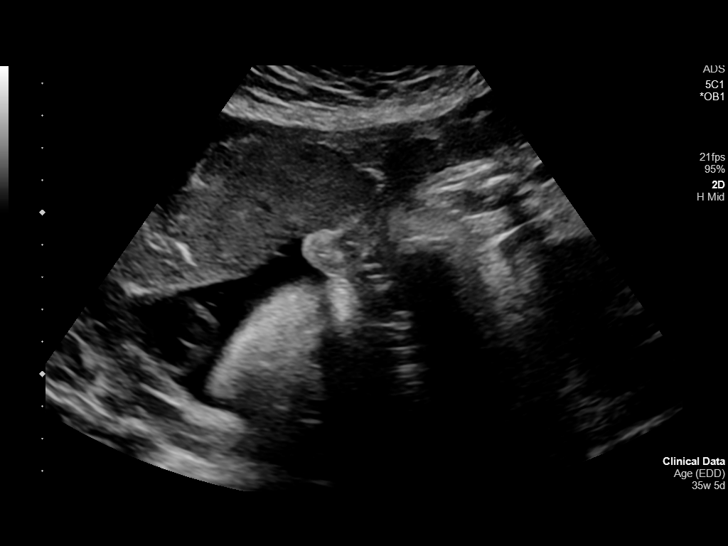
[im 12/19]
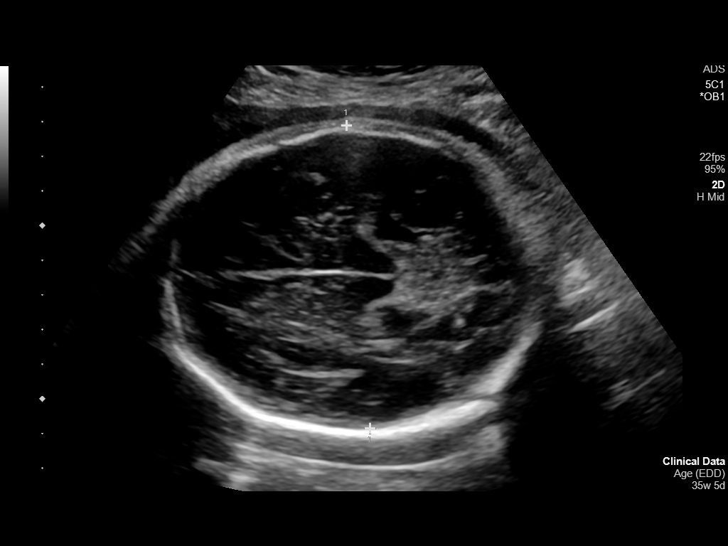
[im 13/19]
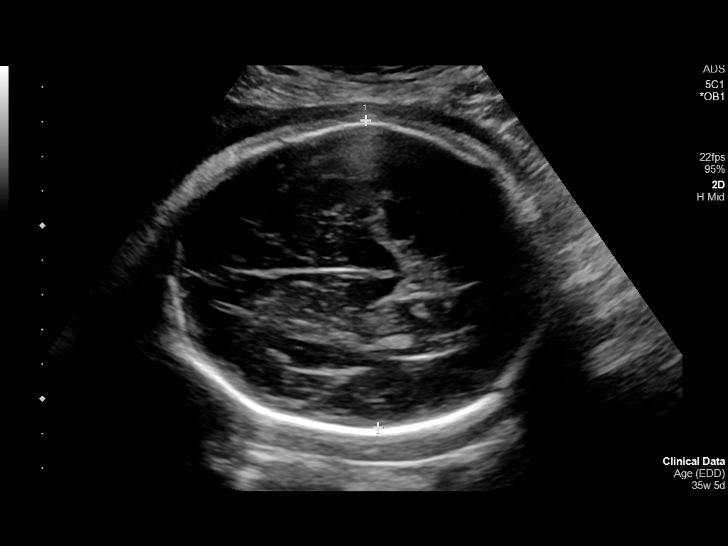
[im 15/19]
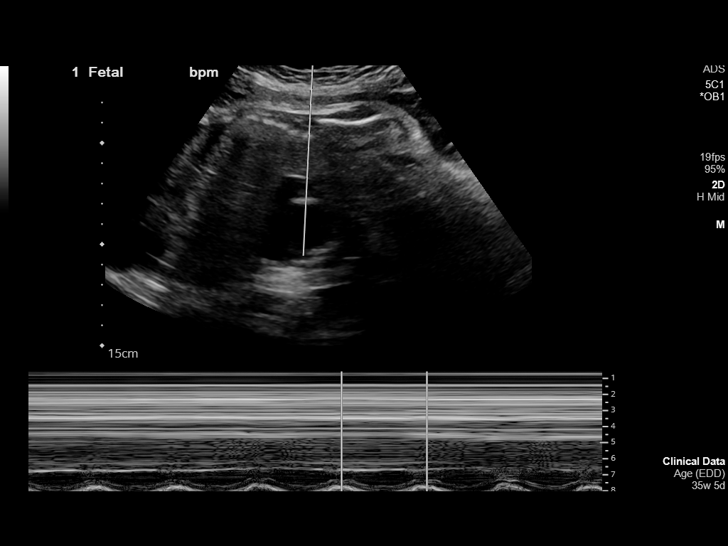
[im 16/19]
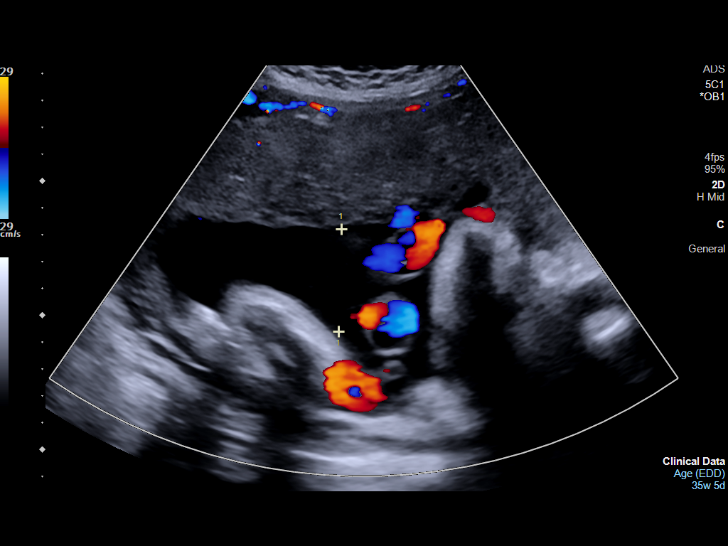
[im 17/19]
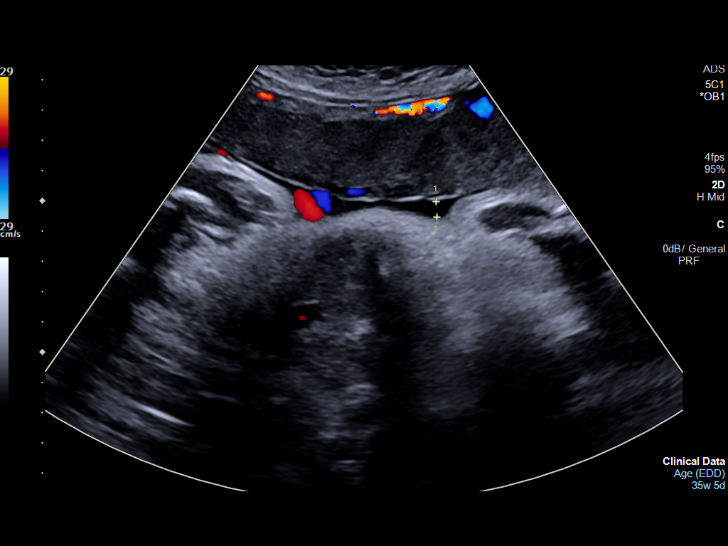
[im 19/19]
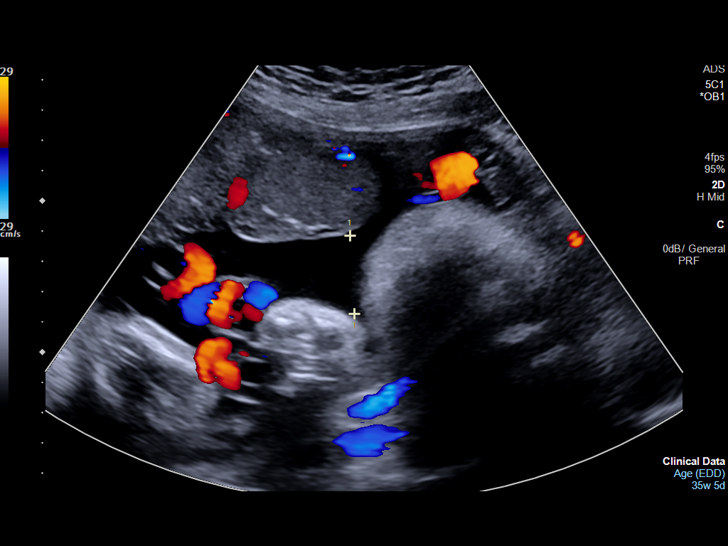

[14 of 19 positions shown; findings below may reference images not displayed]

FINDINGS: Number of Fetuses: 1

Heart Rate:  120 bpm

Movement: Yes

Presentation: Cephalic

Placental Location: Anterior

Previa: None

Amniotic Fluid (Subjective):  Subjectively normal

AFI: 9.4 cm

BPD: 8.9 cm 36 w  0 d

MATERNAL FINDINGS:

Cervix:  Appears closed, though suboptimally visualized.

Uterus/Adnexae: No abnormality visualized.
IMPRESSION: Single live intrauterine gestation in cephalic presentation.

Normal AFI of 9.4 cm.

This exam is performed on an emergent basis and does not
comprehensively evaluate fetal size, dating, or anatomy; follow-up
complete OB US should be considered if further fetal assessment is
warranted.

## 2019-11-10 IMAGING — US LIMITED OBSTETRIC ULTRASOUND
1 series · 14 of 28 positions shown · non-contrast
Comparison: none

CLINICAL DATA: Maternal hypertension. Current assigned gestational
age of 36 weeks 5 days. Evaluate amniotic fluid volume.

EXAM:
LIMITED OBSTETRIC ULTRASOUND

[Series 1: limited obstetric ultrasound · 14 of 29 slices shown]
[im 2/29]
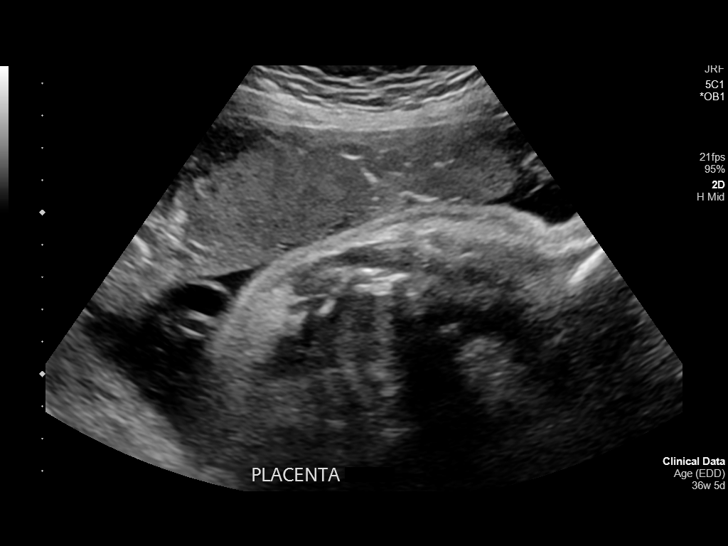
[im 4/29]
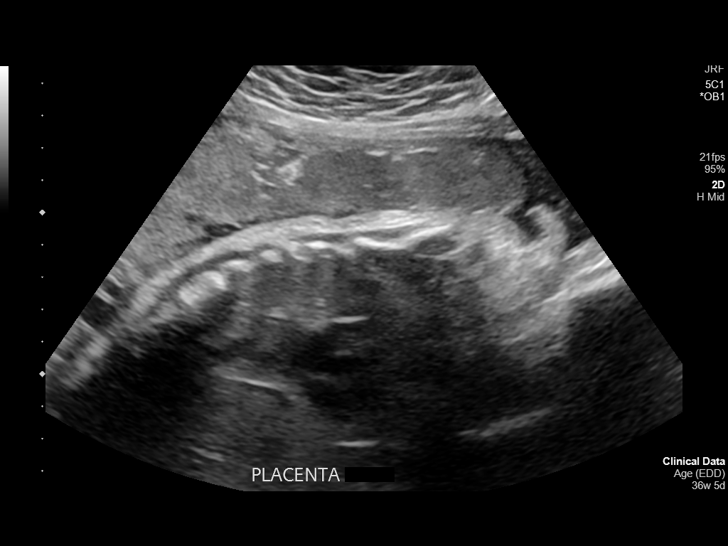
[im 6/29]
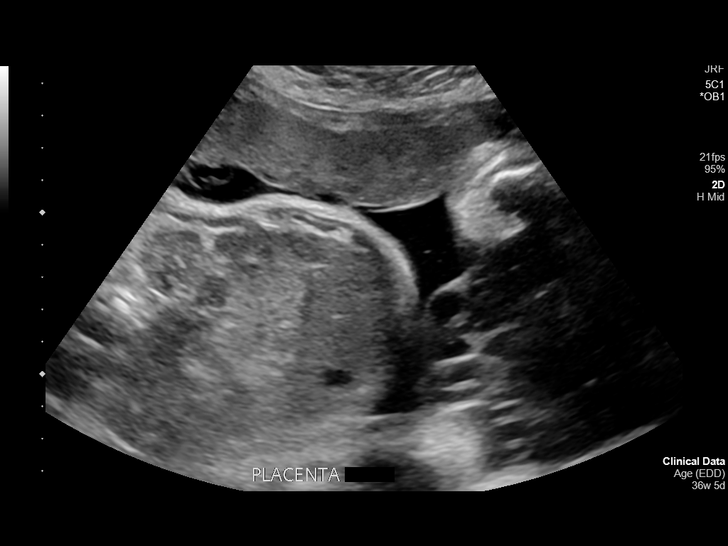
[im 8/29]
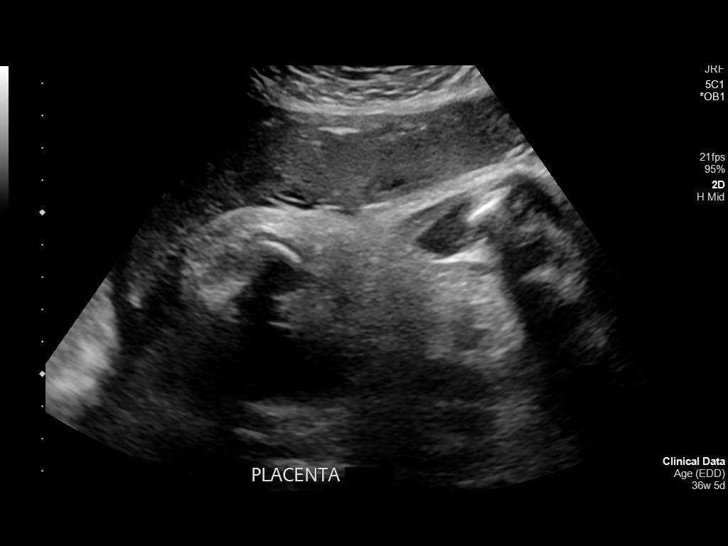
[im 10/29]
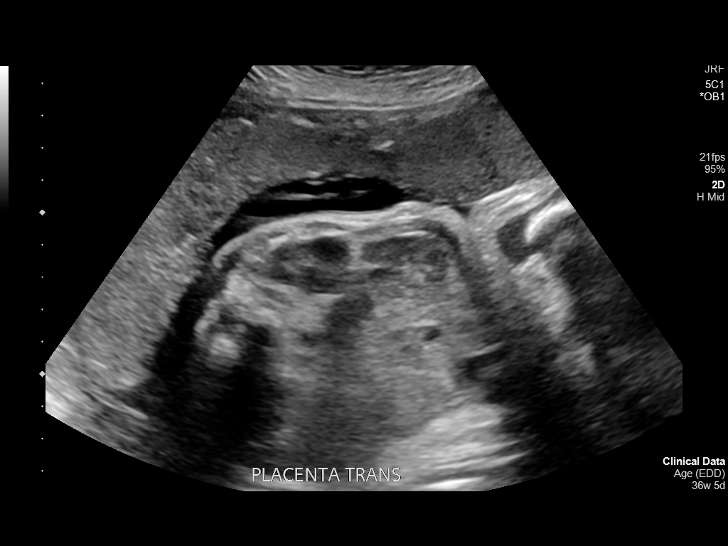
[im 12/29]
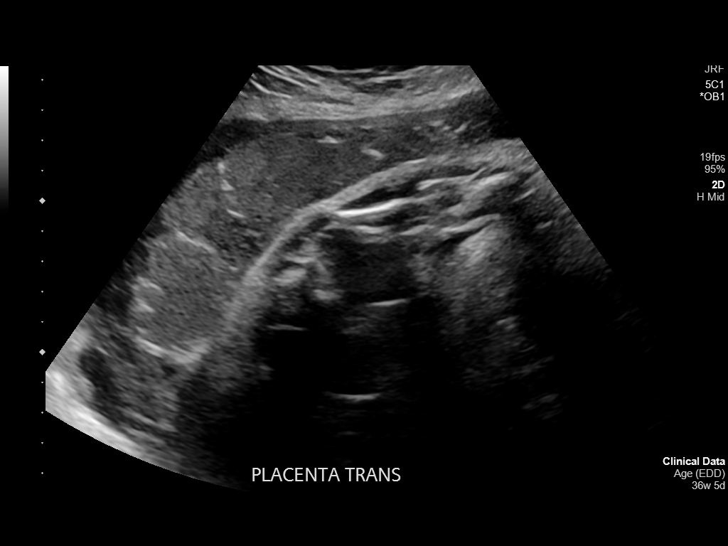
[im 14/29]
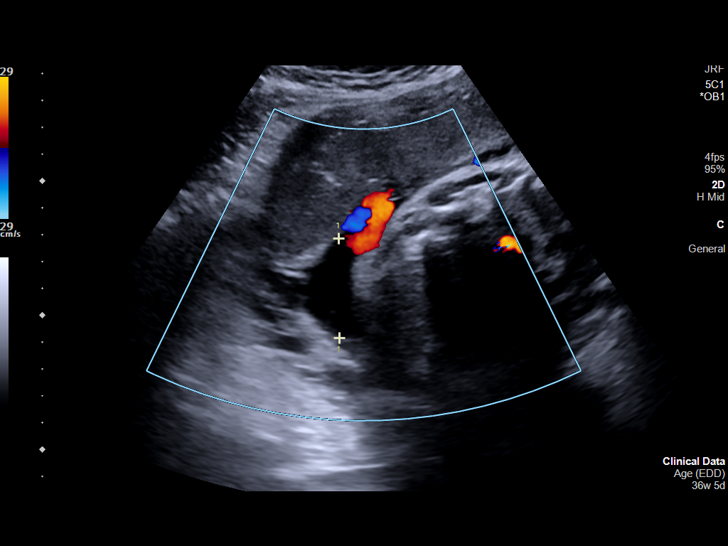
[im 16/29]
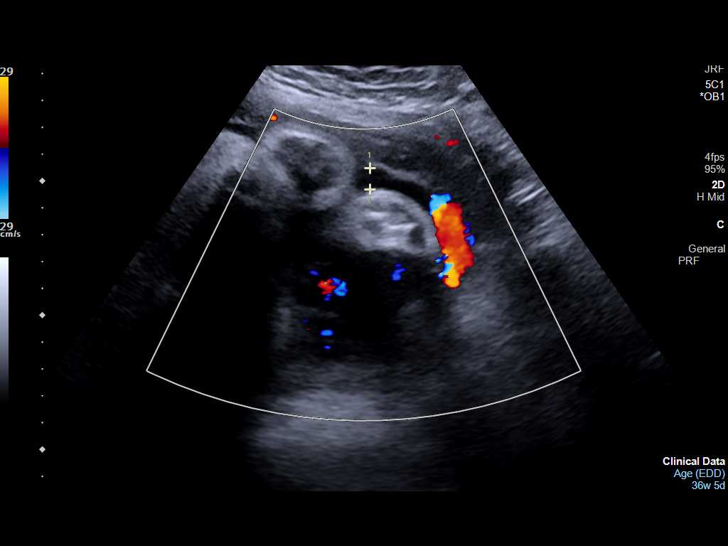
[im 18/29]
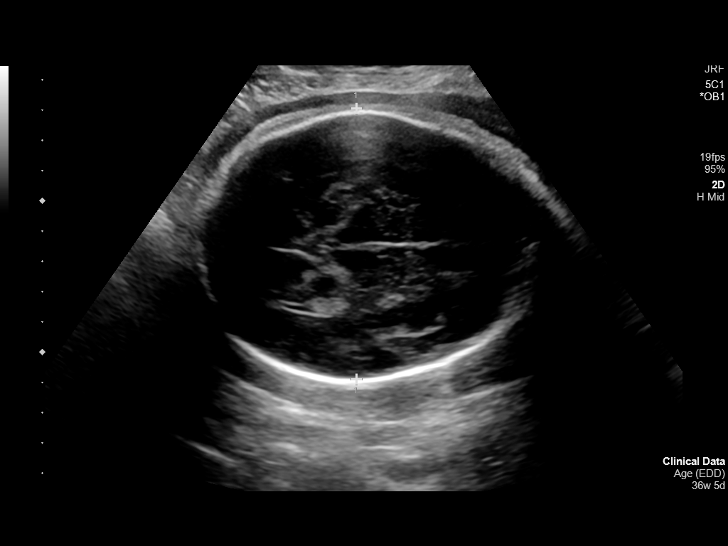
[im 20/29]
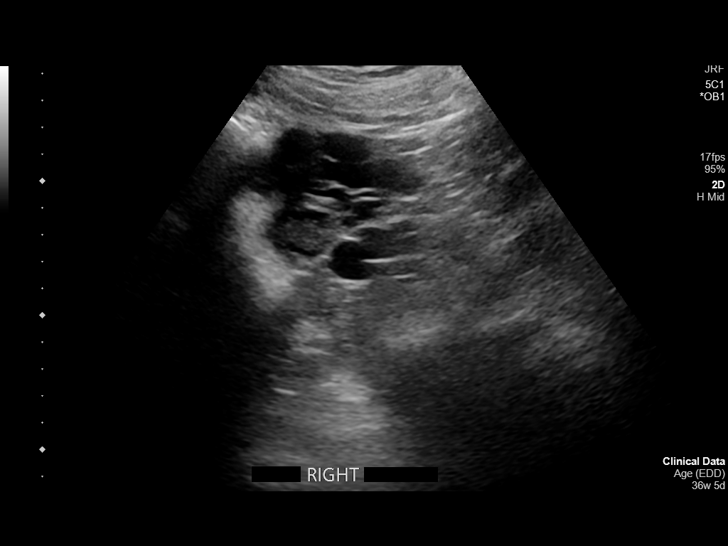
[im 22/29]
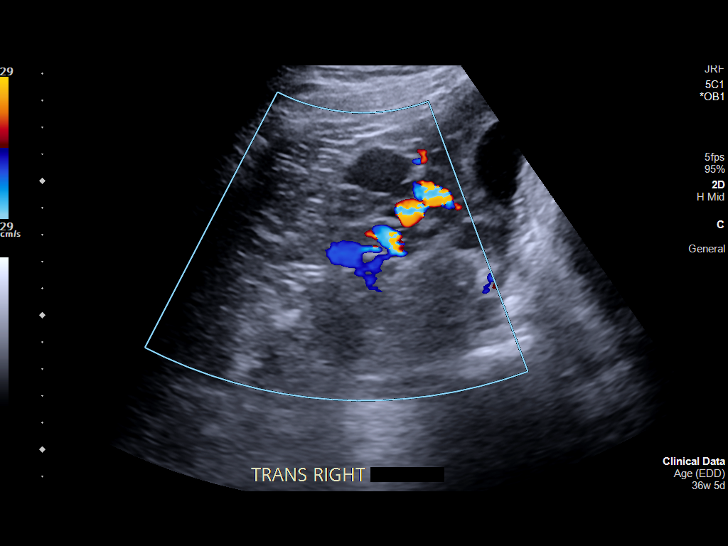
[im 24/29]
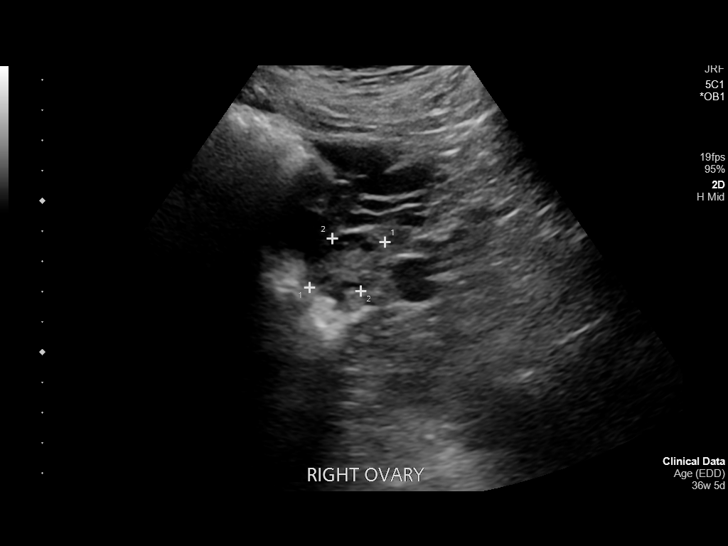
[im 26/29]
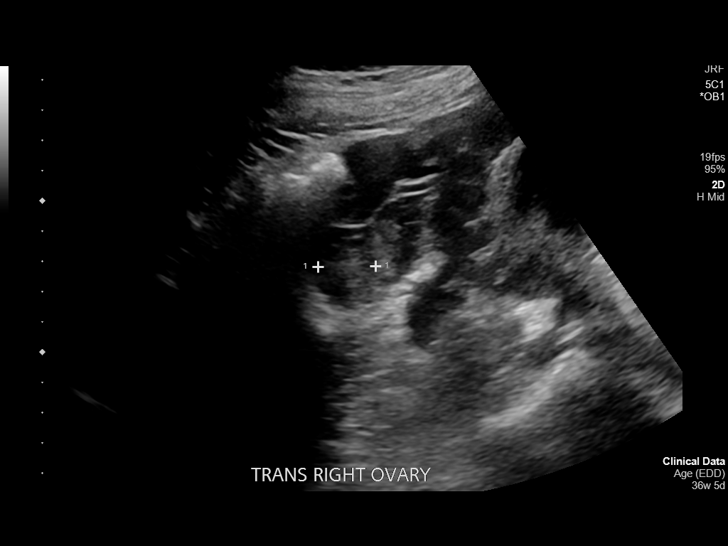
[im 29/29]
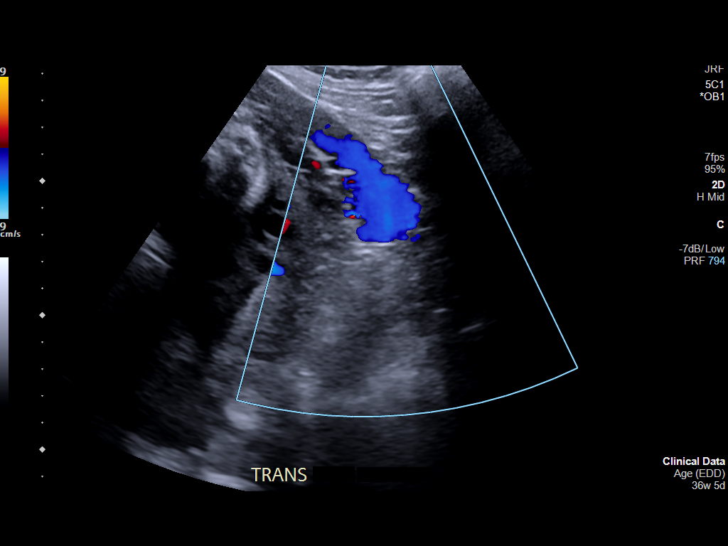

[14 of 28 positions shown; findings below may reference images not displayed]

FINDINGS: Number of Fetuses: 1

Heart Rate:  132 bpm

Movement: Yes

Presentation: Cephalic

Previa: No

Placental Location: Anterior

Amniotic Fluid (Subjective): Within normal limits

AFI 13.1 cm

BPD:  4.0cm 36w 2d

Maternal Findings:

Cervix:  Not visualized

Uterus/Adnexae: No abnormality visualized.
IMPRESSION: Single living intrauterine fetus in cephalic presentation.

Amniotic fluid volume within normal limits, with AFI of 13.1 cm.

## 2019-12-27 ENCOUNTER — Ambulatory Visit: Payer: Self-pay | Admitting: Physician Assistant

## 2019-12-30 ENCOUNTER — Ambulatory Visit: Payer: Self-pay | Admitting: Family Medicine

## 2020-01-03 ENCOUNTER — Ambulatory Visit: Payer: BC Managed Care – PPO | Admitting: Physician Assistant

## 2020-01-03 ENCOUNTER — Other Ambulatory Visit: Payer: Self-pay

## 2020-01-03 ENCOUNTER — Encounter: Payer: Self-pay | Admitting: Physician Assistant

## 2020-01-03 VITALS — BP 142/95 | HR 85 | Temp 98.5°F | Wt 171.0 lb

## 2020-01-03 DIAGNOSIS — I10 Essential (primary) hypertension: Secondary | ICD-10-CM | POA: Diagnosis not present

## 2020-01-03 DIAGNOSIS — J3081 Allergic rhinitis due to animal (cat) (dog) hair and dander: Secondary | ICD-10-CM | POA: Diagnosis not present

## 2020-01-03 DIAGNOSIS — Z23 Encounter for immunization: Secondary | ICD-10-CM | POA: Diagnosis not present

## 2020-01-03 MED ORDER — LOSARTAN POTASSIUM-HCTZ 50-12.5 MG PO TABS: 1.0000 | ORAL_TABLET | Freq: Every day | ORAL | 2 refills | Status: DC

## 2020-01-03 MED ORDER — AZELASTINE HCL 0.05 % OP SOLN: 1.0000 [drp] | Freq: Two times a day (BID) | OPHTHALMIC | 12 refills | Status: DC

## 2020-01-03 NOTE — Patient Instructions (Signed)
Hydrochlorothiazide, HCTZ; Losartan Tablets What is this medicine? LOSARTAN; HYDROCHLOROTHIAZIDE (loe SAR tan; hye droe klor oh THYE a zide) is a combination of a diuretic and an angiotensin II receptor blocker. It treats high blood pressure. It may also be used to lower the risk of stroke. This medicine may be used for other purposes; ask your health care provider or pharmacist if you have questions. COMMON BRAND NAME(S): Hyzaar What should I tell my health care provider before I take this medicine? They need to know if you have any of these conditions:  decreased urine  diabetes  kidney disease  liver disease  if you are on a special diet, like a low-salt diet  immune system problems, like lupus  an unusual or allergic reaction to losartan, hydrochlorothiazide, sulfa drugs, other medicines, foods, dyes, or preservatives  pregnant or trying to get pregnant  breast-feeding How should I use this medicine? Take this drug by mouth. Take it as directed on the prescription label at the same time every day. You can take it with or without food. If it upsets your stomach, take it with food. Keep taking it unless your health care provider tells you to stop. Talk to your health care provider about the use of this drug in children. Special care may be needed. Overdosage: If you think you have taken too much of this medicine contact a poison control center or emergency room at once. NOTE: This medicine is only for you. Do not share this medicine with others. What if I miss a dose? If you miss a dose, take it as soon as you can. If it is almost time for your next dose, take only that dose. Do not take double or extra doses. What may interact with this medicine?  barbiturates, like phenobarbital  blood pressure medicines  celecoxib  cimetidine  corticosteroids  diabetic medicines  diuretics, especially triamterene, spironolactone or amiloride  fluconazole  lithium  NSAIDs,  medicines for pain and inflammation, like ibuprofen or naproxen  potassium salts or potassium supplements  prescription pain medicines  rifampin  skeletal muscle relaxants like tubocurarine  some cholesterol-lowering medicines like cholestyramine or colestipol This list may not describe all possible interactions. Give your health care provider a list of all the medicines, herbs, non-prescription drugs, or dietary supplements you use. Also tell them if you smoke, drink alcohol, or use illegal drugs. Some items may interact with your medicine. What should I watch for while using this medicine? Check your blood pressure regularly while you are taking this medicine. Ask your doctor or health care professional what your blood pressure should be and when you should contact him or her. When you check your blood pressure, write down the measurements to show your doctor or health care professional. If you are taking this medicine for a long time, you must visit your health care professional for regular checks on your progress. Make sure you schedule appointments on a regular basis. You must not get dehydrated. Ask your doctor or health care professional how much fluid you need to drink a day. Check with him or her if you get an attack of severe diarrhea, nausea and vomiting, or if you sweat a lot. The loss of too much body fluid can make it dangerous for you to take this medicine. Women should inform their doctor if they wish to become pregnant or think they might be pregnant. There is a potential for serious side effects to an unborn child, particularly in the second or   third trimester. Talk to your health care professional or pharmacist for more information. You may get drowsy or dizzy. Do not drive, use machinery, or do anything that needs mental alertness until you know how this drug affects you. Do not stand or sit up quickly, especially if you are an older patient. This reduces the risk of dizzy or  fainting spells. Alcohol can make you more drowsy and dizzy. Avoid alcoholic drinks. This medicine may increase blood sugar. Ask your healthcare provider if changes in diet or medicines are needed if you have diabetes. Talk to your health care professional about your risk of skin cancer. You may be more at risk for skin cancer if you take this medicine. This medicine can make you more sensitive to the sun. Keep out of the sun. If you cannot avoid being in the sun, wear protective clothing and use sunscreen. Do not use sun lamps or tanning beds/booths. Avoid salt substitutes unless you are told otherwise by your doctor or health care professional. Do not treat yourself for coughs, colds, or pain while you are taking this medicine without asking your doctor or health care professional for advice. Some ingredients may increase your blood pressure. What side effects may I notice from receiving this medicine? Side effects that you should report to your doctor or health care professional as soon as possible:  allergic reactions like skin rash, itching or hives, swelling of the face, lips, or tongue  breathing problems  changes in vision  dark urine  eye pain  fast or irregular heart beat, palpitations, or chest pain  feeling faint or lightheaded  muscle cramps  persistent dry cough  redness, blistering, peeling or loosening of the skin, including inside the mouth   signs and symptoms of high blood sugar such as being more thirsty or hungry or having to urinate more than normal. You may also feel very tired or have blurry vision.  stomach pain  trouble passing urine  unusual bleeding or bruising  worsened gout pain  yellowing of the eyes or skin Side effects that usually do not require medical attention (report to your doctor or health care professional if they continue or are bothersome):  change in sex drive or performance  headache This list may not describe all possible  side effects. Call your doctor for medical advice about side effects. You may report side effects to FDA at 1-800-FDA-1088. Where should I keep my medicine? Keep out of the reach of children and pets. Store at room temperature between 15 and 30 degrees C (59 and 86 degrees F). Protect from light. Keep the container tightly closed. Throw away any unused drug after the expiration date. NOTE: This sheet is a summary. It may not cover all possible information. If you have questions about this medicine, talk to your doctor, pharmacist, or health care provider.  2020 Elsevier/Gold Standard (2018-11-19 12:58:40)  Azelastine eye solution What is this medicine? AZELASTINE (a ZEL as teen) is an antihistamine. It is used in the eye to treat itching of eyes caused by hay fever or other allergies. This medicine may be used for other purposes; ask your health care provider or pharmacist if you have questions. COMMON BRAND NAME(S): Optivar What should I tell my health care provider before I take this medicine? They need to know if you have any of these conditions:  wear contact lenses  an unusual or allergic reaction to azelastine, other medicines, foods, dyes, or preservatives  pregnant or trying to become  pregnant  breast-feeding How should I use this medicine? This medicine is only for use in the eye. Do not take by mouth. Follow the directions on the prescription label. Wash hands before and after use. Tilt the head back slightly and pull down the lower eyelid with your index finger to form a pouch. Try not to touch the tip of the dropper to your eye, fingertips, or any other surface. Squeeze the prescribed number of drops (usually 1 drop) into the pouch. Close the eye gently. Do not blink. Use your doses at regular intervals. Do not use your medicine more often than directed. If you use other eye medicines, they should be used at least 10 minutes before or after this medicine. Eye ointments should be  applied last. Talk to your pediatrician regarding the use of this medicine in children. Special care may be needed. While this drug may be prescribed for children as young as 66 years of age for selected conditions, precautions do apply. Overdosage: If you think you have taken too much of this medicine contact a poison control center or emergency room at once. NOTE: This medicine is only for you. Do not share this medicine with others. What if I miss a dose? If you miss a dose, use it as soon as you can. If it is almost time for your next dose, use only that dose. Do not use double or extra doses. What may interact with this medicine? Interactions are not expected. Do not use any other eye products without telling your doctor or health care professional. This list may not describe all possible interactions. Give your health care provider a list of all the medicines, herbs, non-prescription drugs, or dietary supplements you use. Also tell them if you smoke, drink alcohol, or use illegal drugs. Some items may interact with your medicine. What should I watch for while using this medicine? Tell your doctor or health care professional if your symptoms do not start to get better within 2 or 3 days. Report any serious side effects promptly. Stop using this medicine if your eyes get swollen, painful, or have a discharge, and see your doctor or health care professional as soon as you can. Contact lenses may be inserted 10 minutes after putting the medicine in the eye. Do not wear contact lenses if your eyes are red. You should not use this medicine to treat irritation that is caused by contact lenses. What side effects may I notice from receiving this medicine? Side effects that you should report to your doctor or health care professional as soon as possible:  allergic reactions like skin rash, itching or hives, swelling of the face, lips, or tongue Side effects that usually do not require medical attention  (report to your doctor or health care professional if they continue or are bothersome):  bitter taste  blurred vision  eye pain, burning or stinging  fatigue  headache This list may not describe all possible side effects. Call your doctor for medical advice about side effects. You may report side effects to FDA at 1-800-FDA-1088. Where should I keep my medicine? Keep out of the reach of children. Keep container tightly closed when not in use. Store upright between 2 and 25 degrees C (36 to 77 degrees F). Throw away any unused medicine after the expiration date. NOTE: This sheet is a summary. It may not cover all possible information. If you have questions about this medicine, talk to your doctor, pharmacist, or health care provider.  2020 Elsevier/Gold Standard (2007-05-28 14:29:55)

## 2020-01-03 NOTE — Progress Notes (Signed)
Established patient visit   Patient: Angela Weaver   DOB: 12-12-1985   34 y.o. Female  MRN: [id number] Visit Date: 01/03/2020  Today's healthcare provider: Margaretann Loveless, PA-C   Chief Complaint  Patient presents with  . Hypertension   Subjective    HPI  Hypertension, follow-up  BP Readings from Last 3 Encounters:  01/03/20 (!) 142/95  08/23/19 120/80  06/27/19 125/78   Wt Readings from Last 3 Encounters:  01/03/20 171 lb (77.6 kg)  08/23/19 163 lb (73.9 kg)  07/11/19 157 lb (71.2 kg)     She was last seen for hypertension 6 months ago.  BP at that visit was 125/78. Management since that visit includes no changes.  She reports excellent compliance with treatment. She is having side effects. Swelling  She is following a Low Sodium diet. She is exercising. She does not smoke.  Use of agents associated with hypertension: none.   Outside blood pressures are not being checked. 125/130-80s Symptoms: No chest pain No chest pressure  No palpitations No syncope  No dyspnea No orthopnea  No paroxysmal nocturnal dyspnea No lower extremity edema   Pertinent labs: Lab Results  Component Value Date   CHOL 197 06/27/2019   HDL 46 06/27/2019   LDLCALC 115 (H) 06/27/2019   TRIG 204 (H) 06/27/2019   CHOLHDL 4.3 06/27/2019   Lab Results  Component Value Date   NA 142 06/27/2019   K 4.1 06/27/2019   CREATININE 0.88 06/27/2019   GFRNONAA 85 06/27/2019   GFRAA 98 06/27/2019   GLUCOSE 84 06/27/2019     The ASCVD Risk score (Goff DC Jr., et al., 2013) failed to calculate for the following reasons:   The 2013 ASCVD risk score is only valid for ages 24 to 62   ---------------------------------------------------------------------------------------------------   Patient Active Problem List   Diagnosis Date Noted  . Allergic rhinitis 11/15/2018  . Bilateral knee swelling 10/04/2018  . Migraine with aura and without status migrainosus, not intractable  08/23/2018  . History of abnormal cervical Pap smear 08/23/2018  . Hypertension 08/22/2018   Past Medical History:  Diagnosis Date  . Anemia   . Heart murmur    SAW CARDIOLOGIST IN 2011 AND EVERYTHING WAS FINE PER PT-  ASYPMTOMATIC  . Hypertension    Chronic  . Migraine    migraines  . Vaginal Pap smear, abnormal    LGSIL   Social History   Tobacco Use  . Smoking status: Former Smoker    Packs/day: 0.25    Years: 2.00    Pack years: 0.50    Types: Cigarettes    Quit date: 04/26/2011    Years since quitting: 8.6  . Smokeless tobacco: Never Used  . Tobacco comment: more socially   Vaping Use  . Vaping Use: Never used  Substance Use Topics  . Alcohol use: No  . Drug use: No   Allergies  Allergen Reactions  . Coconut Flavor Hives and Itching  . Sulfa Antibiotics Hives and Other (See Comments)    Told by mother    . Coconut Oil Rash  . Codeine Rash and Other (See Comments)    Been told by mother    . Codone [Hydrocodone] Anxiety     Medications: Outpatient Medications Prior to Visit  Medication Sig  . amLODipine (NORVASC) 10 MG tablet Take one tablet by mouth everyday.  . cetirizine (ZYRTEC) 10 MG tablet Take 10 mg by mouth daily.  . mometasone (NASONEX) 50  MCG/ACT nasal spray Place 2 sprays into the nose daily.  . Norethindrone-Ethinyl Estradiol-Fe Biphas (LO LOESTRIN FE) 1 MG-10 MCG / 10 MCG tablet Take 1 tablet by mouth daily.   No facility-administered medications prior to visit.    Review of Systems  Constitutional: Positive for fatigue. Negative for activity change, appetite change, chills, diaphoresis, fever and unexpected weight change.  Eyes: Negative for visual disturbance.  Respiratory: Negative.   Cardiovascular: Positive for leg swelling. Negative for chest pain and palpitations.  Gastrointestinal: Negative.   Neurological: Negative for dizziness, light-headedness and headaches.    Last CBC Lab Results  Component Value Date   WBC 5.6  06/27/2019   HGB 12.7 06/27/2019   HCT 36.3 06/27/2019   MCV 81 06/27/2019   MCH 28.3 06/27/2019   RDW 12.1 06/27/2019   PLT 240 06/27/2019   Last metabolic panel Lab Results  Component Value Date   GLUCOSE 84 06/27/2019   NA 142 06/27/2019   K 4.1 06/27/2019   CL 104 06/27/2019   CO2 23 06/27/2019   BUN 20 06/27/2019   CREATININE 0.88 06/27/2019   GFRNONAA 85 06/27/2019   GFRAA 98 06/27/2019   CALCIUM 9.0 06/27/2019   PROT 6.7 06/27/2019   ALBUMIN 4.8 06/27/2019   LABGLOB 1.9 06/27/2019   AGRATIO 2.5 (H) 06/27/2019   BILITOT 0.3 06/27/2019   ALKPHOS 108 06/27/2019   AST 14 06/27/2019   ALT 13 06/27/2019   ANIONGAP 9 07/03/2018   Last lipids Lab Results  Component Value Date   CHOL 197 06/27/2019   HDL 46 06/27/2019   LDLCALC 115 (H) 06/27/2019   TRIG 204 (H) 06/27/2019   CHOLHDL 4.3 06/27/2019   Last hemoglobin A1c No results found for: HGBA1C  Objective    BP (!) 142/95 (BP Location: Left Arm, Patient Position: Sitting, Cuff Size: Large)   Pulse 85   Temp 98.5 F (36.9 C) (Oral)   Wt 171 lb (77.6 kg)   LMP 12/11/2019   BMI 28.02 kg/m    Physical Exam Vitals reviewed.  Constitutional:      General: She is not in acute distress.    Appearance: Normal appearance. She is well-developed. She is not ill-appearing or diaphoretic.  Cardiovascular:     Rate and Rhythm: Normal rate and regular rhythm.     Heart sounds: Normal heart sounds. No murmur heard.  No friction rub. No gallop.   Pulmonary:     Effort: Pulmonary effort is normal. No respiratory distress.     Breath sounds: Normal breath sounds. No wheezing or rales.  Musculoskeletal:     Cervical back: Normal range of motion and neck supple.  Neurological:     Mental Status: She is alert.      No results found for any visits on 01/03/20.  Assessment & Plan     1. Primary hypertension Stop amlodipine due to leg swelling and hand swelling. Start losartan-HCTZ as below. F/U in 4-6 weeks.  -  losartan-hydrochlorothiazide (HYZAAR) 50-12.5 MG tablet; Take 1 tablet by mouth daily.  Dispense: 30 tablet; Refill: 2 - Basic Metabolic Panel (BMET)  2. Allergic rhinitis due to animal hair and dander Having allergy eye symptoms. Will give Optivar as below.  - azelastine (OPTIVAR) 0.05 % ophthalmic solution; Place 1 drop into the right eye 2 (two) times daily.  Dispense: 6 mL; Refill: 12  3. Need for influenza vaccination Flu vaccine given today without complication. Patient sat upright for 15 minutes to check for adverse reaction before  being released. - Flu Vaccine QUAD 6+ mos PF IM (Fluarix Quad PF)   No follow-ups on file.      Delmer Islam, PA-C, have reviewed all documentation for this visit. The documentation on 01/07/20 for the exam, diagnosis, procedures, and orders are all accurate and complete.   Reine Just  [facility] [phone] (phone) [phone] (fax)  [facility]

## 2020-01-04 LAB — BASIC METABOLIC PANEL
BUN/Creatinine Ratio: 17 (ref 9–23)
BUN: 13 mg/dL (ref 6–20)
CO2: 23 mmol/L (ref 20–29)
Calcium: 10.4 mg/dL — ABNORMAL HIGH (ref 8.7–10.2)
Chloride: 100 mmol/L (ref 96–106)
Creatinine, Ser: 0.75 mg/dL (ref 0.57–1.00)
GFR calc Af Amer: 119 mL/min/{1.73_m2} (ref >59)
GFR calc non Af Amer: 104 mL/min/{1.73_m2} (ref >59)
Glucose: 75 mg/dL (ref 65–99)
Potassium: 4.3 mmol/L (ref 3.5–5.2)
Sodium: 139 mmol/L (ref 134–144)

## 2020-01-06 ENCOUNTER — Encounter: Payer: Self-pay | Admitting: Physician Assistant

## 2020-01-07 NOTE — Telephone Encounter (Signed)
I think this is in response to you

## 2020-01-24 ENCOUNTER — Telehealth (INDEPENDENT_AMBULATORY_CARE_PROVIDER_SITE_OTHER): Payer: BC Managed Care – PPO | Admitting: Family Medicine

## 2020-01-24 ENCOUNTER — Ambulatory Visit: Payer: Self-pay | Admitting: Family Medicine

## 2020-01-24 ENCOUNTER — Encounter: Payer: Self-pay | Admitting: Family Medicine

## 2020-01-24 DIAGNOSIS — I10 Essential (primary) hypertension: Secondary | ICD-10-CM | POA: Diagnosis not present

## 2020-01-24 MED ORDER — LOSARTAN POTASSIUM-HCTZ 100-25 MG PO TABS: 1.0000 | ORAL_TABLET | Freq: Every day | ORAL | 3 refills | Status: DC

## 2020-01-24 NOTE — Assessment & Plan Note (Signed)
Well controlled Continue current medications Recheck metabolic panel F/u in 6 months  

## 2020-01-24 NOTE — Progress Notes (Signed)
MyChart Video Visit    Virtual Visit via Video Note   This visit type was conducted due to national recommendations for restrictions regarding the COVID-19 Pandemic (e.g. social distancing) in an effort to limit this patient's exposure and mitigate transmission in our community. This patient is at least at moderate risk for complications without adequate follow up. This format is felt to be most appropriate for this patient at this time. Physical exam was limited by quality of the video and audio technology used for the visit.    Patient location: home Provider location: [facility] Persons involved in the visit: patient, provider  I discussed the limitations of evaluation and management by telemedicine and the availability of in person appointments. The patient expressed understanding and agreed to proceed.  Patient: Angela Weaver   DOB: 1985-04-30   34 y.o. Female  MRN: [id number] Visit Date: 01/24/2020  Today's healthcare provider: Shirlee Latch, MD   Chief Complaint  Patient presents with  . Hypertension   Subjective    HPI   Hypertension, follow-up  BP Readings from Last 3 Encounters:  01/24/20 128/73  01/03/20 (!) 142/95  08/23/19 120/80   Wt Readings from Last 3 Encounters:  01/03/20 171 lb (77.6 kg)  08/23/19 163 lb (73.9 kg)  07/11/19 157 lb (71.2 kg)     She was last seen for hypertension 3 weeks ago.  BP at that visit was 142/95. Management since that visit includes stopping amlodipine and starting losartan/HCTZ. 50/12.5mg  daily.  Pt increased to 2 tablets a day after about two weeks.   She reports excellent compliance with treatment. She is not having side effects.  She is following a Low Sodium diet. She is exercising. She does not smoke.  Use of agents associated with hypertension: none.   Outside blood pressures are 120's/70. Symptoms: No chest pain No chest pressure  No palpitations No syncope  No dyspnea No  orthopnea  No paroxysmal nocturnal dyspnea No lower extremity edema   Pertinent labs: Lab Results  Component Value Date   CHOL 197 06/27/2019   HDL 46 06/27/2019   LDLCALC 115 (H) 06/27/2019   TRIG 204 (H) 06/27/2019   CHOLHDL 4.3 06/27/2019   Lab Results  Component Value Date   NA 139 01/03/2020   K 4.3 01/03/2020   CREATININE 0.75 01/03/2020   GFRNONAA 104 01/03/2020   GFRAA 119 01/03/2020   GLUCOSE 75 01/03/2020     The ASCVD Risk score (Goff DC Jr., et al., 2013) failed to calculate for the following reasons:   The 2013 ASCVD risk score is only valid for ages 83 to 34   ---------------------------------------------------------------------------------------------------   Patient Active Problem List   Diagnosis Date Noted  . Allergic rhinitis 11/15/2018  . Bilateral knee swelling 10/04/2018  . Migraine with aura and without status migrainosus, not intractable 08/23/2018  . History of abnormal cervical Pap smear 08/23/2018  . Hypertension 08/22/2018   Past Medical History:  Diagnosis Date  . Anemia   . Heart murmur    SAW CARDIOLOGIST IN 2011 AND EVERYTHING WAS FINE PER PT-  ASYPMTOMATIC  . Hypertension    Chronic  . Migraine    migraines  . Vaginal Pap smear, abnormal    LGSIL   Social History   Tobacco Use  . Smoking status: Former Smoker    Packs/day: 0.25    Years: 2.00    Pack years: 0.50    Types: Cigarettes    Quit date: 04/26/2011  Years since quitting: 8.7  . Smokeless tobacco: Never Used  . Tobacco comment: more socially   Vaping Use  . Vaping Use: Never used  Substance Use Topics  . Alcohol use: No  . Drug use: No   Allergies  Allergen Reactions  . Coconut Flavor Hives and Itching  . Sulfa Antibiotics Hives and Other (See Comments)    Told by mother    . Coconut Oil Rash  . Codeine Rash and Other (See Comments)    Been told by mother    . Codone [Hydrocodone] Anxiety    Medications: Outpatient Medications Prior to Visit    Medication Sig  . azelastine (OPTIVAR) 0.05 % ophthalmic solution Place 1 drop into the right eye 2 (two) times daily.  . cetirizine (ZYRTEC) 10 MG tablet Take 10 mg by mouth daily.  . mometasone (NASONEX) 50 MCG/ACT nasal spray Place 2 sprays into the nose daily.  . [DISCONTINUED] losartan-hydrochlorothiazide (HYZAAR) 50-12.5 MG tablet Take 1 tablet by mouth daily. (Patient taking differently: Take 2 tablets by mouth daily. )  . Norethindrone-Ethinyl Estradiol-Fe Biphas (LO LOESTRIN FE) 1 MG-10 MCG / 10 MCG tablet Take 1 tablet by mouth daily.   No facility-administered medications prior to visit.    Review of Systems  Constitutional: Negative.   Respiratory: Negative.   Cardiovascular: Negative.   Gastrointestinal: Negative.   Neurological: Negative for dizziness, light-headedness and headaches.      Objective    BP 128/73    Physical Exam Constitutional:      General: She is not in acute distress.    Appearance: Normal appearance. She is not diaphoretic.  HENT:     Head: Normocephalic and atraumatic.  Pulmonary:     Effort: Pulmonary effort is normal. No respiratory distress.  Neurological:     Mental Status: She is alert and oriented to person, place, and time. Mental status is at baseline.  Psychiatric:        Mood and Affect: Mood normal.        Behavior: Behavior normal.        Assessment & Plan     Problem List Items Addressed This Visit      Cardiovascular and Mediastinum   Hypertension    Well controlled Continue current medications Recheck metabolic panel F/u in 6 months       Relevant Medications   losartan-hydrochlorothiazide (HYZAAR) 100-25 MG tablet       Return in about 6 months (around 07/24/2020) for CPE.     I discussed the assessment and treatment plan with the patient. The patient was provided an opportunity to ask questions and all were answered. The patient agreed with the plan and demonstrated an understanding of the  instructions.   The patient was advised to call back or seek an in-person evaluation if the symptoms worsen or if the condition fails to improve as anticipated.   I, Shirlee Latch, MD, have reviewed all documentation for this visit. The documentation on 01/24/20 for the exam, diagnosis, procedures, and orders are all accurate and complete.   Erroll Wilbourne, Marzella Schlein, MD, MPH [facility]

## 2020-01-26 ENCOUNTER — Other Ambulatory Visit: Payer: Self-pay | Admitting: Family Medicine

## 2020-02-27 ENCOUNTER — Other Ambulatory Visit: Payer: Self-pay | Admitting: Obstetrics & Gynecology

## 2020-02-27 MED ORDER — DROSPIRENONE-ETHINYL ESTRADIOL 3-0.03 MG PO TABS: 1.0000 | ORAL_TABLET | Freq: Every day | ORAL | 11 refills | Status: DC

## 2020-02-27 NOTE — Telephone Encounter (Signed)
Any advice? RPH & SDJ not here today

## 2020-02-27 NOTE — Telephone Encounter (Signed)
Not acute not sure what's been discussed for her to try or what she has tried previously

## 2020-04-08 ENCOUNTER — Telehealth (INDEPENDENT_AMBULATORY_CARE_PROVIDER_SITE_OTHER): Payer: BC Managed Care – PPO | Admitting: Physician Assistant

## 2020-04-08 ENCOUNTER — Telehealth: Payer: Self-pay

## 2020-04-08 DIAGNOSIS — R112 Nausea with vomiting, unspecified: Secondary | ICD-10-CM

## 2020-04-08 DIAGNOSIS — M791 Myalgia, unspecified site: Secondary | ICD-10-CM

## 2020-04-08 MED ORDER — ONDANSETRON HCL 4 MG PO TABS: 4.0000 mg | ORAL_TABLET | Freq: Three times a day (TID) | ORAL | 0 refills | Status: DC | PRN

## 2020-04-08 NOTE — Progress Notes (Signed)
MyChart Video Visit    Virtual Visit via Video Note   This visit type was conducted due to national recommendations for restrictions regarding the COVID-19 Pandemic (e.g. social distancing) in an effort to limit this patient's exposure and mitigate transmission in our community. This patient is at least at moderate risk for complications without adequate follow up. This format is felt to be most appropriate for this patient at this time. Physical exam was limited by quality of the video and audio technology used for the visit.   Patient location: Home Provider location: Office Notes  I discussed the limitations of evaluation and management by telemedicine and the availability of in person appointments. The patient expressed understanding and agreed to proceed.  Patient: Angela Weaver   DOB: 1985/07/12   34 y.o. Female  MRN: [id number] Visit Date: 04/08/2020  Today's healthcare provider: Trey Sailors, PA-C   Chief Complaint  Patient presents with  . URI  I,Porsha C McClurkin,acting as a scribe for [employer], PA-C.,have documented all relevant documentation on the behalf of Trey Sailors, PA-C,as directed by  Trey Sailors, PA-C while in the presence of Trey Sailors, PA-C.  Subjective    URI  This is a new problem. The current episode started in the past 7 days. The problem has been gradually worsening. Associated symptoms include congestion, diarrhea, headaches, nausea, rhinorrhea, sinus pain, sneezing and vomiting. Pertinent negatives include no chest pain, coughing, ear pain, sore throat or wheezing. She has tried increased fluids and acetaminophen for the symptoms. The treatment provided mild relief.  Covid test Friday,04/03/2020 and 04/07/2020 and both was negative. Symptoms started 04/06/2020.     Medications: Outpatient Medications Prior to Visit  Medication Sig  . azelastine (OPTIVAR) 0.05 % ophthalmic solution Place 1 drop into the right eye 2  (two) times daily.  . cetirizine (ZYRTEC) 10 MG tablet Take 10 mg by mouth daily.  . drospirenone-ethinyl estradiol (YASMIN 28) 3-0.03 MG tablet Take 1 tablet by mouth daily.  Marland Kitchen losartan-hydrochlorothiazide (HYZAAR) 100-25 MG tablet Take 1 tablet by mouth daily.  . mometasone (NASONEX) 50 MCG/ACT nasal spray Place 2 sprays into the nose daily.   No facility-administered medications prior to visit.    Review of Systems  Constitutional: Positive for appetite change, chills and fatigue.  HENT: Positive for congestion, postnasal drip, rhinorrhea, sinus pressure, sinus pain and sneezing. Negative for ear pain and sore throat.   Respiratory: Negative for cough, chest tightness, shortness of breath and wheezing.   Cardiovascular: Negative for chest pain.  Gastrointestinal: Positive for diarrhea, nausea and vomiting.  Neurological: Positive for weakness and headaches.      Objective    There were no vitals taken for this visit.   Physical Exam Constitutional:      Appearance: Normal appearance. She is ill-appearing.  Pulmonary:     Effort: Pulmonary effort is normal. No respiratory distress.  Neurological:     Mental Status: She is alert.  Psychiatric:        Mood and Affect: Mood normal.        Behavior: Behavior normal.        Assessment & Plan    1. Myalgia  - COVID-19, Flu A+B and RSV - ondansetron (ZOFRAN) 4 MG tablet; Take 1 tablet (4 mg total) by mouth every 8 (eight) hours as needed for nausea or vomiting.  Dispense: 20 tablet; Refill: 0  2. Nausea and vomiting, intractability of vomiting not specified, unspecified vomiting  type  - COVID-19, Flu A+B and RSV - ondansetron (ZOFRAN) 4 MG tablet; Take 1 tablet (4 mg total) by mouth every 8 (eight) hours as needed for nausea or vomiting.  Dispense: 20 tablet; Refill: 0   No follow-ups on file.     I discussed the assessment and treatment plan with the patient. The patient was provided an opportunity to ask questions  and all were answered. The patient agreed with the plan and demonstrated an understanding of the instructions.   The patient was advised to call back or seek an in-person evaluation if the symptoms worsen or if the condition fails to improve as anticipated.  ITrey Sailors, PA-C, have reviewed all documentation for this visit. The documentation on 04/08/20 for the exam, diagnosis, procedures, and orders are all accurate and complete.  The entirety of the information documented in the History of Present Illness, Review of Systems and Physical Exam were personally obtained by me. Portions of this information were initially documented by [facility] and reviewed by me for thoroughness and accuracy.    Maryella Shivers [facility] [phone] (phone) [phone] (fax)  [facility]

## 2020-04-08 NOTE — Telephone Encounter (Signed)
Husband asking if virtual visit today ca be changed to in office. Advised him with her symptoms it is best to keep Virtual. Verbalizes understanding.

## 2020-04-10 ENCOUNTER — Encounter: Payer: Self-pay | Admitting: Physician Assistant

## 2020-04-10 LAB — COVID-19, FLU A+B AND RSV
Influenza A, NAA: NOT DETECTED
Influenza B, NAA: NOT DETECTED
RSV, NAA: NOT DETECTED
SARS-CoV-2, NAA: NOT DETECTED

## 2020-05-03 ENCOUNTER — Other Ambulatory Visit: Payer: Self-pay | Admitting: Obstetrics & Gynecology

## 2020-05-22 ENCOUNTER — Other Ambulatory Visit: Payer: Self-pay

## 2020-05-22 ENCOUNTER — Encounter: Payer: Self-pay | Admitting: Physician Assistant

## 2020-05-22 ENCOUNTER — Ambulatory Visit (INDEPENDENT_AMBULATORY_CARE_PROVIDER_SITE_OTHER): Payer: BC Managed Care – PPO | Admitting: Physician Assistant

## 2020-05-22 VITALS — BP 108/83 | HR 70 | Temp 98.6°F | Wt 159.8 lb

## 2020-05-22 DIAGNOSIS — R202 Paresthesia of skin: Secondary | ICD-10-CM | POA: Diagnosis not present

## 2020-05-22 DIAGNOSIS — R079 Chest pain, unspecified: Secondary | ICD-10-CM

## 2020-05-22 NOTE — Patient Instructions (Signed)

## 2020-05-22 NOTE — Progress Notes (Signed)
Established patient visit   Patient: Angela Weaver   DOB: Aug 08, 1985   34 y.o. Female  MRN: [id number] Visit Date: 05/22/2020  Today's healthcare provider: Trey Sailors, PA-C   Chief Complaint  Patient presents with  . Follow-up    ER  I,Adriana M Pollak,acting as a scribe for Trey Sailors, PA-C.,have documented all relevant documentation on the behalf of Trey Sailors, PA-C,as directed by  Trey Sailors, PA-C while in the presence of Trey Sailors, PA-C.  Subjective    HPI HPI    Follow-up    Comments: ER       Last edited by Margo Common, CMA on 05/22/2020  2:03 PM. (History)      Follow up ER visit  Patient was seen in ER for chest pain on 05/22/2020. She reports it was sternal chest pain that worsened over time. EMS was called and she got an EKG with them but declined transport. She was seen at [facility] ER.  She was treated for chest pain. Treatment for this included:EKG, chest x-ray, troponin, D dimer, pregnant test, CMET, CBC which were all normal.  She reports she took a lidocaine patch and naproxen which was not helpful.  She reports this condition is Unchanged.  She reports her pain is still present but better. She denies lifting anything, inuring herself. This has never happened before. She is stressed bt at her baseline dur to four kids.   -----------------------------------------------------------------------------------------       Medications: Outpatient Medications Prior to Visit  Medication Sig  . azelastine (OPTIVAR) 0.05 % ophthalmic solution Place 1 drop into the right eye 2 (two) times daily.  . cetirizine (ZYRTEC) 10 MG tablet Take 10 mg by mouth daily.  Marland Kitchen losartan-hydrochlorothiazide (HYZAAR) 100-25 MG tablet Take 1 tablet by mouth daily.  . mometasone (NASONEX) 50 MCG/ACT nasal spray Place 2 sprays into the nose daily.  . ondansetron (ZOFRAN) 4 MG tablet Take 1 tablet (4 mg total) by mouth every 8  (eight) hours as needed for nausea or vomiting.  Marland Kitchen SYEDA 3-0.03 MG tablet TAKE 1 TABLET BY MOUTH EVERY DAY   No facility-administered medications prior to visit.    Review of Systems  Constitutional: Negative.   Respiratory: Positive for chest tightness and shortness of breath.   Cardiovascular: Positive for chest pain and palpitations.  Gastrointestinal: Negative for nausea and vomiting.       Objective    BP 108/83 (BP Location: Left Arm, Patient Position: Sitting, Cuff Size: Normal)   Pulse 70   Temp 98.6 F (37 C) (Oral)   Wt 159 lb 12.8 oz (72.5 kg)   LMP 05/11/2020 (Exact Date)   SpO2 100%   BMI 26.19 kg/m     Physical Exam Constitutional:      Appearance: Normal appearance.  Cardiovascular:     Rate and Rhythm: Normal rate and regular rhythm.     Heart sounds: Normal heart sounds.  Pulmonary:     Effort: Pulmonary effort is normal. No respiratory distress.     Breath sounds: Normal breath sounds.  Skin:    General: Skin is warm and dry.  Neurological:     General: No focal deficit present.     Mental Status: She is alert and oriented to person, place, and time.  Psychiatric:        Mood and Affect: Mood normal.        Behavior: Behavior normal.  No results found for any visits on 05/22/20.  Assessment & Plan    1. Chest pain, unspecified type  Low suspicion for cardiac etiology. She has appropriate follow up with cardiology scheduled.   2. Tingling    No follow-ups on file.      ITrey Sailors, PA-C, have reviewed all documentation for this visit. The documentation on 06/03/20 for the exam, diagnosis, procedures, and orders are all accurate and complete.  The entirety of the information documented in the History of Present Illness, Review of Systems and Physical Exam were personally obtained by me. Portions of this information were initially documented by [facility] and reviewed by me for thoroughness and accuracy.   I spent 20  minutes dedicated to the care of this patient on the date of this encounter to include pre-visit review of records, face-to-face time with the patient discussing chest pain, and post visit ordering of testing.    Maryella Shivers  [facility] [phone] (phone) [phone] (fax)  [facility]

## 2020-06-30 ENCOUNTER — Encounter: Payer: Self-pay | Admitting: Family Medicine

## 2020-06-30 ENCOUNTER — Telehealth: Payer: Self-pay

## 2020-06-30 ENCOUNTER — Encounter: Payer: Self-pay | Admitting: Physician Assistant

## 2020-06-30 NOTE — Telephone Encounter (Signed)
Spoke with patient who states that she has spoken with CMA to arrange appt. KW

## 2020-06-30 NOTE — Telephone Encounter (Signed)
Can we double book my 1pm on Thursday with this virtual visit?

## 2020-06-30 NOTE — Telephone Encounter (Signed)
Copied from CRM [id number]. Topic: General - Other >> Jun 30, 2020 11:58 AM Pawlus, Angela Weaver wrote: Reason for CRM: Pt scheduled Weaver virtual appt for 4/12 to discuss her anxiety, pt wanted some advice since there were no appts for this week. Please advise.

## 2020-06-30 NOTE — Telephone Encounter (Signed)
See mychart encounter response. Can have virtual visit with Macario Carls Just tomorrow or double book my 1pm on Thursday with virtual visit to discuss med options.

## 2020-06-30 NOTE — Telephone Encounter (Signed)
Spoke with patient on the phone who states that she saw Ricki Rodriguez in Feb for panic attacks, she states that at visit she was referred to cardiology and was told by Karna Christmas that they would discuss medication after cardiology consult. Patient states that her anxiety has increased since last office visit especially in the past two weeks since separating from her husband. Patient states that she has a echo and follow up scheduled with cardiology in near future but states that she would like to be treated for anxiety now. Patient reports frequent panic attacks, she denies any suicidal or homicidal thoughts, insomnia, decreased appetite, chest pain, irregular heart rate, elevated blood pressure or near syncope episodes. Patient would like prescription sent to [facility] in [place]. Please review patient chart and advise. KW

## 2020-06-30 NOTE — Telephone Encounter (Signed)
Patient was scheduled on Dr. B schedule for Thursday @ 1:00 PM

## 2020-07-02 ENCOUNTER — Telehealth: Payer: Self-pay | Admitting: Family Medicine

## 2020-07-07 ENCOUNTER — Telehealth: Payer: BC Managed Care – PPO | Admitting: Adult Health

## 2020-07-09 ENCOUNTER — Telehealth: Payer: Self-pay | Admitting: Family Medicine

## 2020-07-23 ENCOUNTER — Telehealth (INDEPENDENT_AMBULATORY_CARE_PROVIDER_SITE_OTHER): Payer: BC Managed Care – PPO | Admitting: Family Medicine

## 2020-07-23 DIAGNOSIS — Z5329 Procedure and treatment not carried out because of patient's decision for other reasons: Secondary | ICD-10-CM

## 2020-07-23 NOTE — Progress Notes (Signed)
No showed. Unable to reach by phone either.

## 2020-09-02 ENCOUNTER — Encounter: Payer: Self-pay | Admitting: Physician Assistant

## 2020-09-07 ENCOUNTER — Other Ambulatory Visit: Payer: Self-pay | Admitting: *Deleted

## 2020-09-07 MED ORDER — VALACYCLOVIR HCL 500 MG PO TABS: 500.0000 mg | ORAL_TABLET | Freq: Three times a day (TID) | ORAL | 0 refills | Status: DC

## 2020-09-07 NOTE — Telephone Encounter (Signed)
Rx sent to pharmacy   

## 2020-10-23 ENCOUNTER — Other Ambulatory Visit: Payer: Self-pay | Admitting: Cardiology

## 2020-10-23 ENCOUNTER — Other Ambulatory Visit: Payer: BC Managed Care – PPO

## 2020-10-23 DIAGNOSIS — Z79899 Other long term (current) drug therapy: Secondary | ICD-10-CM

## 2020-10-27 ENCOUNTER — Other Ambulatory Visit: Payer: BC Managed Care – PPO

## 2020-10-27 ENCOUNTER — Other Ambulatory Visit: Payer: Self-pay

## 2020-11-05 ENCOUNTER — Other Ambulatory Visit: Payer: Self-pay

## 2020-11-05 ENCOUNTER — Other Ambulatory Visit: Payer: BC Managed Care – PPO

## 2020-11-05 ENCOUNTER — Other Ambulatory Visit: Payer: Self-pay | Admitting: Obstetrics & Gynecology

## 2020-11-05 MED ORDER — CIPROFLOXACIN HCL 500 MG PO TABS: 500.0000 mg | ORAL_TABLET | Freq: Two times a day (BID) | ORAL | 0 refills | Status: DC

## 2021-07-28 NOTE — Progress Notes (Deleted)
Complete physical exam   Patient: Angela Weaver   DOB: 1985/10/28   36 y.o. Female  MRN: [id number] Visit Date: 07/29/2021  Today's healthcare provider: Lavon Paganini, MD   No chief complaint on file.  Subjective    Angela Weaver is a 36 y.o. female who presents today for a complete physical exam.  She reports consuming a {diet types:17450} diet. {Exercise:19826} She generally feels {well/fairly well/poorly:18703}. She reports sleeping {well/fairly well/poorly:18703}. She {does/does not:200015} have additional problems to discuss today.  HPI    Past Medical History:  Diagnosis Date   Anemia    Heart murmur    SAW CARDIOLOGIST IN 2011 AND EVERYTHING WAS FINE PER PT-  ASYPMTOMATIC   Hypertension    Chronic   Migraine    migraines   Vaginal Pap smear, abnormal    LGSIL   Past Surgical History:  Procedure Laterality Date   DILATION AND EVACUATION  04/27/2017   Procedure: DILATATION AND EVACUATION;  Surgeon: Gae Dry, MD;  Location: [facility] ORS;  Service: Gynecology;;   KNEE SURGERY     TONSILLECTOMY     WISDOM TOOTH EXTRACTION     Social History   Socioeconomic History   Marital status: Married    Spouse name: Not on file   Number of children: 4   Years of education: Not on file   Highest education level: Not on file  Occupational History   Not on file  Tobacco Use   Smoking status: Former    Packs/day: 0.25    Years: 2.00    Pack years: 0.50    Types: Cigarettes    Quit date: 04/26/2011    Years since quitting: 10.2   Smokeless tobacco: Never   Tobacco comments:    more socially   Vaping Use   Vaping Use: Never used  Substance and Sexual Activity   Alcohol use: No   Drug use: No   Sexual activity: Not Currently    Birth control/protection: Pill  Other Topics Concern   Not on file  Social History Narrative   Not on file   Social Determinants of Health   Financial Resource Strain: Not on file  Food Insecurity: Not on file   Transportation Needs: Not on file  Physical Activity: Not on file  Stress: Not on file  Social Connections: Not on file  Intimate Partner Violence: Not on file   Family Status  Relation Name Status   Mat Aunt  (Not Specified)   MGM  (Not Specified)   MGF  (Not Specified)   Mother  Alive   Father  Alive   Son  (Not Specified)   Neg Hx  (Not Specified)   Family History  Problem Relation Age of Onset   Melanoma Maternal Aunt 90   Melanoma Maternal Grandmother 72   Diabetes Maternal Grandfather    Kidney disease Mother    Kidney disease Son        produces a lot of crystal and kidney stones   Breast cancer Neg Hx    Colon cancer Neg Hx    Allergies  Allergen Reactions   Coconut Flavor Hives and Itching   Sulfa Antibiotics Hives and Other (See Comments)    Told by mother     Coconut Oil Rash   Codeine Rash and Other (See Comments)    Been told by mother     Codone [Hydrocodone] Anxiety    Patient Care Team: Virginia Crews, MD as  PCP - General (Family Medicine)   Medications: Outpatient Medications Prior to Visit  Medication Sig   azelastine (OPTIVAR) 0.05 % ophthalmic solution Place 1 drop into the right eye 2 (two) times daily.   cetirizine (ZYRTEC) 10 MG tablet Take 10 mg by mouth daily.   ciprofloxacin (CIPRO) 500 MG tablet Take 1 tablet (500 mg total) by mouth 2 (two) times daily.   losartan-hydrochlorothiazide (HYZAAR) 100-25 MG tablet Take 1 tablet by mouth daily.   mometasone (NASONEX) 50 MCG/ACT nasal spray Place 2 sprays into the nose daily.   SYEDA 3-0.03 MG tablet TAKE 1 TABLET BY MOUTH EVERY DAY   valACYclovir (VALTREX) 500 MG tablet Take 1 tablet (500 mg total) by mouth 3 (three) times daily.   No facility-administered medications prior to visit.    Review of Systems  All other systems reviewed and are negative.  Last CBC Lab Results  Component Value Date   WBC 5.6 06/27/2019   HGB 12.7 06/27/2019   HCT 36.3 06/27/2019   MCV 81  06/27/2019   MCH 28.3 06/27/2019   RDW 12.1 06/27/2019   PLT 240 [id number]   Last metabolic panel Lab Results  Component Value Date   GLUCOSE 75 01/03/2020   NA 139 01/03/2020   K 4.3 01/03/2020   CL 100 01/03/2020   CO2 23 01/03/2020   BUN 13 01/03/2020   CREATININE 0.75 01/03/2020   GFRNONAA 104 01/03/2020   CALCIUM 10.4 (H) 01/03/2020   PROT 6.7 06/27/2019   ALBUMIN 4.8 06/27/2019   LABGLOB 1.9 06/27/2019   AGRATIO 2.5 (H) 06/27/2019   BILITOT 0.3 06/27/2019   ALKPHOS 108 06/27/2019   AST 14 06/27/2019   ALT 13 06/27/2019   ANIONGAP 9 07/03/2018   Last lipids Lab Results  Component Value Date   CHOL 197 06/27/2019   HDL 46 06/27/2019   LDLCALC 115 (H) 06/27/2019   TRIG 204 (H) 06/27/2019   CHOLHDL 4.3 06/27/2019       Objective    There were no vitals taken for this visit. BP Readings from Last 3 Encounters:  05/22/20 108/83  01/24/20 128/73  01/03/20 (!) 142/95   Wt Readings from Last 3 Encounters:  05/22/20 159 lb 12.8 oz (72.5 kg)  01/03/20 171 lb (77.6 kg)  08/23/19 163 lb (73.9 kg)       Physical Exam  ***  Last depression screening scores    01/03/2020   10:42 AM 06/27/2019    3:17 PM 12/27/2018    8:40 AM  PHQ 2/9 Scores  PHQ - 2 Score 0 1 1  PHQ- 9 Score 1 2 2    Last fall risk screening    01/03/2020   10:42 AM  Fall Risk   Falls in the past year? 0  Number falls in past yr: 0  Injury with Fall? 0  Risk for fall due to : No Fall Risks  Follow up Falls evaluation completed   Last Audit-C alcohol use screening    06/27/2019    3:18 PM  Alcohol Use Disorder Test (AUDIT)  1. How often do you have a drink containing alcohol? 0  2. How many drinks containing alcohol do you have on a typical day when you are drinking? 0  3. How often do you have six or more drinks on one occasion? 0  AUDIT-C Score 0   A score of 3 or more in women, and 4 or more in men indicates increased risk for alcohol abuse, EXCEPT if all  of the points are from  question 1   No results found for any visits on 07/29/21.  Assessment & Plan    Routine Health Maintenance and Physical Exam  Exercise Activities and Dietary recommendations  Goals   None     Immunization History  Administered Date(s) Administered   Influenza,inj,Quad PF,6+ Mos 12/29/2017, 01/03/2020   Influenza-Unspecified 01/18/2019   Moderna Sars-Covid-2 Vaccination 05/29/2019, 06/28/2019   Tdap 05/14/2018   Varicella 03/18/2016, 07/05/2018    Health Maintenance  Topic Date Due   Hepatitis C Screening  Never done   COVID-19 Vaccine (3 - Moderna risk series) 07/26/2019   PAP SMEAR-Modifier  08/15/2021   INFLUENZA VACCINE  10/26/2021   TETANUS/TDAP  05/14/2028   HIV Screening  Completed   HPV VACCINES  Aged Out    Discussed health benefits of physical activity, and encouraged her to engage in regular exercise appropriate for her age and condition.  ***  No follow-ups on file.     {provider attestation***:1}   Lavon Paganini, MD  [facility] [phone] (phone) [phone] (fax)  [place]

## 2021-07-29 ENCOUNTER — Encounter: Payer: BC Managed Care – PPO | Admitting: Family Medicine

## 2021-08-09 ENCOUNTER — Ambulatory Visit
Admission: EM | Admit: 2021-08-09 | Discharge: 2021-08-09 | Disposition: A | Discharge status: Home / Self Care | Payer: Self-pay

## 2021-08-09 ENCOUNTER — Encounter: Payer: Self-pay | Admitting: Emergency Medicine

## 2021-08-09 DIAGNOSIS — J029 Acute pharyngitis, unspecified: Secondary | ICD-10-CM | POA: Insufficient documentation

## 2021-08-09 DIAGNOSIS — J069 Acute upper respiratory infection, unspecified: Secondary | ICD-10-CM | POA: Insufficient documentation

## 2021-08-09 LAB — GROUP A STREP BY PCR: Group A Strep by PCR: NOT DETECTED

## 2021-08-09 MED ORDER — PROMETHAZINE-DM 6.25-15 MG/5ML PO SYRP: 5.0000 mL | ORAL_SOLUTION | Freq: Four times a day (QID) | ORAL | 0 refills | Status: DC | PRN

## 2021-08-09 MED ORDER — IPRATROPIUM BROMIDE 0.06 % NA SOLN: 2.0000 | Freq: Four times a day (QID) | NASAL | 12 refills | Status: DC

## 2021-08-09 NOTE — Discharge Instructions (Addendum)
Your strep test today was negative. ? ?I believe that your cough at night, as well as your sore throat, are coming from your upper respiratory congestion and postnasal drip. ? ?Use the Atrovent nasal spray, 2 squirts up each nostril every 6 hours, to help with nasal congestion and postnasal drip. ? ?Gargle with warm salt water 2-3 times a day to help wash away the drainage and aid in your relief of sore throat pain. ? ?You can also use over-the-counter Tylenol and ibuprofen according to the package instructions as needed for pain. ? ?Use the Promethazine DM cough syrup, 1 teaspoon at bedtime, to help with your cough at night.  This will also have the added effect of drying up your nasal drainage which I believe is feeding her cough. ? ?Return for reevaluation for any new or worsening symptoms. ?

## 2021-08-09 NOTE — ED Triage Notes (Signed)
Pt reports sore throat, nasal congestion and swollen lymph nodes on neck since Thursday. Tried nyquil at night with no relief.  ?

## 2021-08-09 NOTE — ED Provider Notes (Signed)
?[facility] ? ? ? ?CSN: [id number] ?Arrival date & time: 08/09/21  0920 ? ? ?  ? ?History   ?Chief Complaint ?Chief Complaint  ?Patient presents with  ? Sore Throat  ? Lymphadenopathy  ? Nasal Congestion  ? ? ?HPI ?Angela Weaver is a 36 y.o. female.  ? ?HPI ? ?36 year old female here for evaluation of upper respiratory complaints. ? ?Patient reports that she has been experiencing nasal congestion with scant clear nasal discharge, painful swallowing, and a nonproductive cough for the past 4 days.  She denies any fever, ear pain, shortness of breath, or wheezing.  She states that her sore throat is neither sharp nor scratchy but she is having significant pain with swallowing. ? ?Past Medical History:  ?Diagnosis Date  ? Anemia   ? Heart murmur   ? SAW CARDIOLOGIST IN 2011 AND EVERYTHING WAS FINE PER PT-  ASYPMTOMATIC  ? Hypertension   ? Chronic  ? Migraine   ? migraines  ? Vaginal Pap smear, abnormal   ? LGSIL  ? ? ?Patient Active Problem List  ? Diagnosis Date Noted  ? Allergic rhinitis 11/15/2018  ? Bilateral knee swelling 10/04/2018  ? Migraine with aura and without status migrainosus, not intractable 08/23/2018  ? History of abnormal cervical Pap smear 08/23/2018  ? Hypertension 08/22/2018  ? ? ?Past Surgical History:  ?Procedure Laterality Date  ? DILATION AND EVACUATION  04/27/2017  ? Procedure: DILATATION AND EVACUATION;  Surgeon: Gae Dry, MD;  Location: [facility] ORS;  Service: Gynecology;;  ? KNEE SURGERY    ? TONSILLECTOMY    ? WISDOM TOOTH EXTRACTION    ? ? ?OB History   ? ? Gravida  ?7  ? Para  ?4  ? Term  ?4  ? Preterm  ?   ? AB  ?3  ? Living  ?4  ?  ? ? SAB  ?3  ? IAB  ?   ? Ectopic  ?   ? Multiple  ?0  ? Live Births  ?4  ?   ?  ?  ? ? ? ?Home Medications   ? ?Prior to Admission medications   ?Medication Sig Start Date End Date Taking? Authorizing Provider  ?ipratropium (ATROVENT) 0.06 % nasal spray Place 2 sprays into both nostrils 4 (four) times daily. 08/09/21  Yes Margarette Canada, NP   ?metoprolol succinate (TOPROL-XL) 25 MG 24 hr tablet Take 1 tablet by mouth daily. 09/14/20 09/14/21 Yes [provider]  ?promethazine-dextromethorphan (PROMETHAZINE-DM) 6.25-15 MG/5ML syrup Take 5 mLs by mouth 4 (four) times daily as needed. 08/09/21  Yes Margarette Canada, NP  ?azelastine (OPTIVAR) 0.05 % ophthalmic solution Place 1 drop into the right eye 2 (two) times daily. 01/03/20   Mar Daring, PA-C  ?cetirizine (ZYRTEC) 10 MG tablet Take 10 mg by mouth daily.    [provider]  ?ciprofloxacin (CIPRO) 500 MG tablet Take 1 tablet (500 mg total) by mouth 2 (two) times daily. 11/05/20   Gae Dry, MD  ?losartan-hydrochlorothiazide (HYZAAR) 100-25 MG tablet Take 1 tablet by mouth daily. 01/24/20   Virginia Crews, MD  ?mometasone (NASONEX) 50 MCG/ACT nasal spray Place 2 sprays into the nose daily.    [provider]  ?SYEDA 3-0.03 MG tablet TAKE 1 TABLET BY MOUTH EVERY DAY 05/04/20   Gae Dry, MD  ?valACYclovir (VALTREX) 500 MG tablet Take 1 tablet (500 mg total) by mouth 3 (three) times daily. 09/07/20   Jerrol Banana.,  MD  ? ? ?Family History ?Family History  ?Problem Relation Age of Onset  ? Melanoma Maternal Aunt 50  ? Melanoma Maternal Grandmother 2  ? Diabetes Maternal Grandfather   ? Kidney disease Mother   ? Kidney disease Son   ?     produces a lot of crystal and kidney stones  ? Breast cancer Neg Hx   ? Colon cancer Neg Hx   ? ? ?Social History ?Social History  ? ?Tobacco Use  ? Smoking status: Former  ?  Packs/day: 0.25  ?  Years: 2.00  ?  Pack years: 0.50  ?  Types: Cigarettes  ?  Quit date: 04/26/2011  ?  Years since quitting: 10.2  ? Smokeless tobacco: Never  ? Tobacco comments:  ?  more socially   ?Vaping Use  ? Vaping Use: Never used  ?Substance Use Topics  ? Alcohol use: No  ? Drug use: No  ? ? ? ?Allergies   ?Coconut flavor, Sulfa antibiotics, Coconut oil, Codeine, and Codone [hydrocodone] ? ? ?Review of Systems ?Review of Systems   ?Constitutional:  Negative for fever.  ?HENT:  Positive for congestion, rhinorrhea and sore throat. Negative for ear pain.   ?Respiratory:  Positive for cough. Negative for shortness of breath and wheezing.   ?     Cough is dry and present at nighttime only.  ?Hematological: Negative.   ?Psychiatric/Behavioral: Negative.    ? ? ?Physical Exam ?Triage Vital Signs ?ED Triage Vitals  ?Enc Vitals Group  ?   BP 08/09/21 1030 (!) 142/99  ?   Pulse Rate 08/09/21 1030 76  ?   Resp 08/09/21 1030 16  ?   Temp 08/09/21 1030 98.6 ?F (37 ?C)  ?   Temp Source 08/09/21 1030 Oral  ?   SpO2 08/09/21 1030 97 %  ?   Weight 08/09/21 1029 159 lb 13.3 oz (72.5 kg)  ?   Height 08/09/21 1029 5' 5.5" (1.664 m)  ?   Head Circumference --   ?   Peak Flow --   ?   Pain Score 08/09/21 1028 8  ?   Pain Loc --   ?   Pain Edu? --   ?   Excl. in [place]? --   ? ?No data found. ? ?Updated Vital Signs ?BP (!) 142/99 (BP Location: Left Arm)   Pulse 76   Temp 98.6 ?F (37 ?C) (Oral)   Resp 16   Ht 5' 5.5" (1.664 m)   Wt 159 lb 13.3 oz (72.5 kg)   SpO2 97%   BMI 26.19 kg/m?  ? ?Visual Acuity ?Right Eye Distance:   ?Left Eye Distance:   ?Bilateral Distance:   ? ?Right Eye Near:   ?Left Eye Near:    ?Bilateral Near:    ? ?Physical Exam ?Vitals and nursing note reviewed.  ?Constitutional:   ?   Appearance: Normal appearance. She is not ill-appearing.  ?HENT:  ?   Head: Normocephalic and atraumatic.  ?   Right Ear: Tympanic membrane, ear canal and external ear normal. There is no impacted cerumen.  ?   Left Ear: Tympanic membrane, ear canal and external ear normal. There is no impacted cerumen.  ?   Nose: Congestion and rhinorrhea present.  ?   Mouth/Throat:  ?   Mouth: Mucous membranes are moist.  ?   Pharynx: Oropharynx is clear. Posterior oropharyngeal erythema present. No oropharyngeal exudate.  ?Cardiovascular:  ?   Rate and Rhythm: Normal rate and  regular rhythm.  ?   Pulses: Normal pulses.  ?   Heart sounds: Normal heart sounds. No murmur heard. ?   No friction rub. No gallop.  ?Pulmonary:  ?   Effort: Pulmonary effort is normal.  ?   Breath sounds: Normal breath sounds. No wheezing, rhonchi or rales.  ?Musculoskeletal:  ?   Cervical back: Normal range of motion and neck supple.  ?Lymphadenopathy:  ?   Cervical: Cervical adenopathy present.  ?Skin: ?   General: Skin is warm and dry.  ?   Capillary Refill: Capillary refill takes less than 2 seconds.  ?   Findings: No erythema or rash.  ?Neurological:  ?   General: No focal deficit present.  ?   Mental Status: She is alert and oriented to person, place, and time.  ?Psychiatric:     ?   Mood and Affect: Mood normal.     ?   Behavior: Behavior normal.     ?   Thought Content: Thought content normal.     ?   Judgment: Judgment normal.  ? ? ? ?UC Treatments / Results  ?Labs ?(all labs ordered are listed, but only abnormal results are displayed) ?Labs Reviewed  ?GROUP A STREP BY PCR  ? ? ?EKG ? ? ?Radiology ?No results found. ? ?Procedures ?Procedures (including critical care time) ? ?Medications Ordered in UC ?Medications - No data to display ? ?Initial Impression / Assessment and Plan / UC Course  ?I have reviewed the triage vital signs and the nursing notes. ? ?Pertinent labs & imaging results that were available during my care of the patient were reviewed by me and considered in my medical decision making (see chart for details). ? ?Very pleasant, nontoxic-appearing 36 year old female here for evaluation of upper respiratory complaints as outlined HPI above.  Her most significant symptom is significant sore throat that is associated with painful swallowing.  She does endorse some nasal congestion and scant clear nasal discharge when she blows but no rhinorrhea.  She has not had a fever and she denies any shortness breath or wheezing.  She does have a cough but is present at nighttime only.  Physical exam reveals pearly-gray tympanic membranes bilaterally with normal light reflex and clear external auditory canals.   Nasal mucosa is erythematous and edematous with dried yellow discharge in the left nare.  Right nare is clear.  Oropharyngeal exam reveals posterior oropharyngeal erythema and injection.  Tonsillar pillars are sur

## 2021-08-18 ENCOUNTER — Other Ambulatory Visit: Payer: Self-pay | Admitting: Family Medicine

## 2021-08-19 ENCOUNTER — Other Ambulatory Visit: Payer: Self-pay | Admitting: Family Medicine

## 2021-08-19 MED ORDER — VALACYCLOVIR HCL 500 MG PO TABS: 500.0000 mg | ORAL_TABLET | Freq: Three times a day (TID) | ORAL | 0 refills | Status: DC

## 2021-10-14 ENCOUNTER — Encounter: Payer: BC Managed Care – PPO | Admitting: Family Medicine

## 2022-02-28 ENCOUNTER — Ambulatory Visit: Payer: BC Managed Care – PPO | Admitting: Family Medicine

## 2022-02-28 ENCOUNTER — Encounter: Payer: Self-pay | Admitting: Family Medicine

## 2022-02-28 ENCOUNTER — Ambulatory Visit: Payer: Self-pay | Admitting: *Deleted

## 2022-02-28 VITALS — BP 134/93 | HR 79 | Ht 65.0 in | Wt 140.7 lb

## 2022-02-28 DIAGNOSIS — J069 Acute upper respiratory infection, unspecified: Secondary | ICD-10-CM | POA: Diagnosis not present

## 2022-02-28 LAB — POC COVID19 BINAXNOW: SARS Coronavirus 2 Ag: NEGATIVE

## 2022-02-28 LAB — POCT INFLUENZA A/B
Influenza A, POC: NEGATIVE
Influenza B, POC: NEGATIVE

## 2022-02-28 NOTE — Progress Notes (Signed)
   SUBJECTIVE:   CHIEF COMPLAINT / HPI:   UPPER RESPIRATORY TRACT INFECTION - symptom onset yesterday - coworker with RSV - called EMS early this morning. Got up to use bathroom and felt dizzy with low BP. BP with EMS 120s SBP but dipped to 85-90 SBP with HR 50s. Happened once before years ago. Declined ED. EKG NSR.  - on metoprolol for HTN, last took Saturday. Often forgets to take on weekends. Hasn't taken due to feeling bad.  Worst symptom: headache, body aches Fever: yes, Tmax 101.71F Cough:  a little, non productive Shortness of breath: no Chest pain: no Chest tightness: no Chest congestion: no Nasal congestion:  a little Runny nose: no Sore throat: no Headache: yes Vomiting: no Sick contacts: yes Treatments attempted: tylenol cold/flu     OBJECTIVE:   BP (!) 134/93 (BP Location: Right Arm, Patient Position: Sitting, Cuff Size: Normal)   Pulse 79   Ht 5\' 5"  (1.651 m)   Wt 140 lb 11.2 oz (63.8 kg)   SpO2 100%   BMI 23.41 kg/m   Gen: tired appearing, in NAD Card: RRR Lungs: CTAB Ext: WWP, no edema   ASSESSMENT/PLAN:   VIRAL URI Moderate sx. COVID/flu negative. With poor oral intake likely contributing to hypotension. Exam and vitals reassuring today. Reviewed OTC symptom relief, return and emergency precautions. Note provided for work.  Orthostatic hypotension Likely 2/2 poor oral intake given URI. Orthostatic vitals negative and with reassuring vitals today. Encouraged adequate oral hydration.   , DO

## 2022-02-28 NOTE — Telephone Encounter (Signed)
Opened chart to answer question for the agent.    Pt's daughter was calling in but not with her mother.   EMS was called out last night  due to a low BP for her mother.   Refused the ED.   EMS told her to see her PCP today.   Pt has an appt. This morning at 9:30 with Dr. Linwood Dibbles (an hour from now) so no triage felt necessary.

## 2022-03-01 ENCOUNTER — Ambulatory Visit: Payer: Self-pay

## 2022-03-01 ENCOUNTER — Encounter: Payer: Self-pay | Admitting: Family Medicine

## 2022-03-01 NOTE — Telephone Encounter (Signed)
Summary: positive Covid/process   Pt was seen yesterday and was supposed to test for covid again today and is positive, she had messaged Dr early am on MyChart with no call back. Is positive and is wanting to know what to do, anxious she will not hear back, office not picking up want to know the process.  [phone]       Left message to call back.

## 2022-03-01 NOTE — Telephone Encounter (Addendum)
  Chief Complaint: Covid positive Symptoms: Seen yesterday, retested covid positive today. Afebrile today, mild SOB with exertion only. BP 120/91 "Low for me." Is not taking meds as advised at OV. Frequency: Positive today Pertinent Negatives: Patient denies fever Disposition: [] ED /[] Urgent Care (no appt availability in office) / [] Appointment(In office/virtual)/ []  [place] [facility]/ [] Home Care/ [] Refused Recommended Disposition /[] [place] [facility]/ [x]  Follow-up with PCP Additional Notes:  Pt is most concerned stating last time she had covid she had cardiac issues.  Assured pt NT would route to practice for PCPs review.   Care advice provided, and reviewed self isolation guidelines. Pt verbalizes understanding. Also questioning if she can take her Valtrex as she has a fever blister. Already has med COvidReason for Disposition  [1] HIGH RISK patient AND [2] influenza is widespread in the community AND [3] ONE OR MORE respiratory symptoms: cough, sore throat, runny or stuffy nose  Answer Assessment - Initial Assessment Questions 1. COVID-19 DIAGNOSIS: "How do you know that you have COVID?" (e.g., positive lab test or self-test, diagnosed by doctor or NP/PA, symptoms after exposure).     Home test today 2. COVID-19 EXPOSURE: "Was there any known exposure to COVID before the symptoms began?" CDC Definition of close contact: within 6 feet (2 meters) for a total of 15 minutes or more over a 24-hour period.       3. ONSET: "When did the COVID-19 symptoms start?"      Seen yesterday in OV 4. WORST SYMPTOM: "What is your worst symptom?" (e.g., cough, fever, shortness of breath, muscle aches)    BP issues 5. COUGH: "Do you have a cough?" If Yes, ask: "How bad is the cough?"       Yes 6. FEVER: "Do you have a fever?" If Yes, ask: "What is your temperature, how was it measured, and when did it start?"     None today 7. RESPIRATORY STATUS: "Describe your breathing?" (e.g., normal;  shortness of breath, wheezing, unable to speak)      *No Answer* 8. BETTER-SAME-WORSE: "Are you getting better, staying the same or getting worse compared to yesterday?"  If getting worse, ask, "In what way?"     *No Answer* 9. OTHER SYMPTOMS: "Do you have any other symptoms?"  (e.g., chills, fatigue, headache, loss of smell or taste, muscle pain, sore throat)     More congestion. 10. HIGH RISK DISEASE: "Do you have any chronic medical problems?" (e.g., asthma, heart or lung disease, weak immune system, obesity, etc.)       Hard to catch breath 11. VACCINE: "Have you had the COVID-19 vaccine?" If Yes, ask: "Which one, how many shots, when did you get it?"  Protocols used: Coronavirus (COVID-19) Diagnosed or Suspected-A-AH

## 2022-03-02 ENCOUNTER — Other Ambulatory Visit: Payer: Self-pay | Admitting: Family Medicine

## 2022-03-02 MED ORDER — NIRMATRELVIR/RITONAVIR (PAXLOVID)TABLET: 3.0000 | ORAL_TABLET | Freq: Two times a day (BID) | ORAL | 0 refills | Status: AC

## 2022-03-04 ENCOUNTER — Other Ambulatory Visit: Payer: Self-pay

## 2022-03-04 DIAGNOSIS — J069 Acute upper respiratory infection, unspecified: Secondary | ICD-10-CM

## 2022-03-04 DIAGNOSIS — Z8619 Personal history of other infectious and parasitic diseases: Secondary | ICD-10-CM

## 2022-03-04 MED ORDER — VALACYCLOVIR HCL 500 MG PO TABS: 500.0000 mg | ORAL_TABLET | Freq: Three times a day (TID) | ORAL | 0 refills | Status: DC

## 2022-03-11 ENCOUNTER — Encounter: Payer: Self-pay | Admitting: Family Medicine

## 2022-03-11 ENCOUNTER — Ambulatory Visit: Payer: Self-pay

## 2022-03-11 NOTE — Telephone Encounter (Signed)
     Chief Complaint: Rash, burning, red blotches. Working today. Declines OV at this time will call back. Seeking advice from PCP. Symptoms: Above Frequency: Tuesday Pertinent Negatives: Patient denies fever Disposition: [] ED /[] Urgent Care (no appt availability in office) / [] Appointment(In office/virtual)/ []  [place] [facility]/ [] Home Care/ [] Refused Recommended Disposition /[] [place] [facility]/ [x]  Follow-up with PCP Additional Notes: Please advise pt.  Answer Assessment - Initial Assessment Questions 1. APPEARANCE of RASH: "Describe the rash." (e.g., spots, blisters, raised areas, skin peeling, scaly)     Red , splotchy 2. SIZE: "How big are the spots?" (e.g., tip of pen, eraser, coin; inches, centimeters)     Small 3. LOCATION: "Where is the rash located?"     Arms, legs, trunk 4. COLOR: "What color is the rash?" (Note: It is difficult to assess rash color in people with darker-colored skin. When this situation occurs, simply ask the caller to describe what they see.)     Red 5. ONSET: "When did the rash begin?"     Tuesday 6. FEVER: "Do you have a fever?" If Yes, ask: "What is your temperature, how was it measured, and when did it start?"     No 7. ITCHING: "Does the rash itch?" If Yes, ask: "How bad is the itch?" (Scale 1-10; or mild, moderate, severe)     Mild 8. CAUSE: "What do you think is causing the rash?"     Unsure 9. MEDICINE FACTORS: "Have you started any new medicines within the last 2 weeks?" (e.g., antibiotics)      Yes 10. OTHER SYMPTOMS: "Do you have any other symptoms?" (e.g., dizziness, headache, sore throat, joint pain)       No 11. PREGNANCY: "Is there any chance you are pregnant?" "When was your last menstrual period?"       No  Protocols used: Rash or Redness - [facility]

## 2022-06-09 ENCOUNTER — Ambulatory Visit: Payer: Self-pay | Admitting: *Deleted

## 2022-06-09 ENCOUNTER — Ambulatory Visit: Payer: BC Managed Care – PPO | Admitting: Physician Assistant

## 2022-06-09 NOTE — Telephone Encounter (Signed)
Reason for Disposition  SEVERE (e.g., excruciating) throat pain  Answer Assessment - Initial Assessment Questions 1. ONSET: "When did the throat start hurting?" (Hours or days ago)      Monday pm 2. SEVERITY: "How bad is the sore throat?" (Scale 1-10; mild, moderate or severe)   - MILD (1-3):  Doesn't interfere with eating or normal activities.   - MODERATE (4-7): Interferes with eating some solids and normal activities.   - SEVERE (8-10):  Excruciating pain, interferes with most normal activities.   - SEVERE WITH DYSPHAGIA (10): Can't swallow liquids, drooling.     Mild- severe when swallowing 3. STREP EXPOSURE: "Has there been any exposure to strep within the past week?" If Yes, ask: "What type of contact occurred?"      2 weeks ago- hx strep- sister diagnosed 1 week ago 4.  VIRAL SYMPTOMS: "Are there any symptoms of a cold, such as a runny nose, cough, hoarse voice or red eyes?"      Congestion- allergy 5. FEVER: "Do you have a fever?" If Yes, ask: "What is your temperature, how was it measured, and when did it start?"     no 6. PUS ON THE TONSILS: "Is there pus on the tonsils in the back of your throat?"     Pink only 7. OTHER SYMPTOMS: "Do you have any other symptoms?" (e.g., difficulty breathing, headache, rash)     no  Protocols used: Sore Throat-A-AH

## 2022-06-09 NOTE — Progress Notes (Deleted)
     I,Sha'taria Cristi Gwynn,acting as a Education administrator for [employer], PA-C.,have documented all relevant documentation on the behalf of Mikey Kirschner, PA-C,as directed by  Mikey Kirschner, PA-C while in the presence of Mikey Kirschner, PA-C.   Established patient visit   Patient: Angela Weaver   DOB: 1985/05/01   36 y.o. Female  MRN: [id number] Visit Date: 06/09/2022  Today's healthcare provider: Mikey Kirschner, PA-C   No chief complaint on file.  Subjective    Sore Throat  This is a new problem. The current episode started in the past 7 days (Monday). The pain is severe. Associated symptoms include congestion and trouble swallowing.    Patient reports recent dx of strep and exposure  Medications: Outpatient Medications Prior to Visit  Medication Sig   azelastine (OPTIVAR) 0.05 % ophthalmic solution Place 1 drop into the right eye 2 (two) times daily. (Patient not taking: Reported on 02/28/2022)   cetirizine (ZYRTEC) 10 MG tablet Take 10 mg by mouth daily. (Patient not taking: Reported on 02/28/2022)   ipratropium (ATROVENT) 0.06 % nasal spray Place 2 sprays into both nostrils 4 (four) times daily.   metoprolol tartrate (LOPRESSOR) 25 MG tablet Take 37.5 mg by mouth 2 (two) times daily.   mometasone (NASONEX) 50 MCG/ACT nasal spray Place 2 sprays into the nose daily. (Patient not taking: Reported on 02/28/2022)   montelukast (SINGULAIR) 10 MG tablet Take 10 mg by mouth at bedtime.   promethazine-dextromethorphan (PROMETHAZINE-DM) 6.25-15 MG/5ML syrup Take 5 mLs by mouth 4 (four) times daily as needed. (Patient not taking: Reported on 02/28/2022)   valACYclovir (VALTREX) 500 MG tablet Take 1 tablet (500 mg total) by mouth 3 (three) times daily.   No facility-administered medications prior to visit.    Review of Systems  HENT:  Positive for congestion and trouble swallowing.     {Labs  Heme  Chem  Endocrine  Serology  Results Review (optional):23779}   Objective    There were no  vitals taken for this visit. {Show previous vital signs (optional):23777}  Physical Exam  ***  No results found for any visits on 06/09/22.  Assessment & Plan     ***  No follow-ups on file.      {provider attestation***:1}   Mikey Kirschner, PA-C  [place] [phone] (phone) [phone] (fax)  [place]

## 2022-06-09 NOTE — Telephone Encounter (Signed)
  Chief Complaint: sore throat Symptoms: recent diagnosis strep- recent reexposure- sore throat-severe , congestion Frequency: Started Monday pm Pertinent Negatives: Patient denies difficulty breathing, headache, rash Disposition: [] ED /[] Urgent Care (no appt availability in office) / [x] Appointment(In office/virtual)/ []  [facility]/ [] Home Care/ [] Refused Recommended Disposition /[] [facility]/ []  Follow-up with PCP Additional Notes: Patient has appointment today- report pain with swallowing- no other difficulty- has appointment this afternoon. Patient is within disposition- advised call back for worsening symptoms before appointment.

## 2022-08-01 ENCOUNTER — Ambulatory Visit
Admission: EM | Admit: 2022-08-01 | Discharge: 2022-08-01 | Disposition: A | Discharge status: Home / Self Care | Payer: BC Managed Care – PPO | Attending: Family Medicine | Admitting: Family Medicine

## 2022-08-01 ENCOUNTER — Encounter: Payer: Self-pay | Admitting: Emergency Medicine

## 2022-08-01 DIAGNOSIS — J020 Streptococcal pharyngitis: Secondary | ICD-10-CM | POA: Diagnosis present

## 2022-08-01 DIAGNOSIS — I10 Essential (primary) hypertension: Secondary | ICD-10-CM | POA: Diagnosis present

## 2022-08-01 LAB — GROUP A STREP BY PCR: Group A Strep by PCR: DETECTED — AB

## 2022-08-01 MED ORDER — AMOXICILLIN-POT CLAVULANATE 875-125 MG PO TABS: 1.0000 | ORAL_TABLET | Freq: Two times a day (BID) | ORAL | 0 refills | Status: AC

## 2022-08-01 MED ORDER — DEXAMETHASONE 4 MG PO TABS: 4.0000 mg | ORAL_TABLET | Freq: Two times a day (BID) | ORAL | 0 refills | Status: AC

## 2022-08-01 NOTE — ED Triage Notes (Signed)
Pt presents with a sore throat. Her kids were diagnosed with strep last week.

## 2022-08-01 NOTE — Discharge Instructions (Addendum)
Your rapid strep test is positive. I am starting you on Augmentin.  Take it with food. Dexamethasone to reduce the postpharyngeal swelling. Lidocaine viscous may be used.  Ibuprofen 3-4 times a day may be used to reduce pain and inflammation. May be contagious for the first 24 hours.

## 2022-08-01 NOTE — ED Provider Notes (Signed)
[facility]    CSN: [id number] Arrival date & time: 08/01/22  0825      History   Chief Complaint Chief Complaint  Patient presents with   Sore Throat    HPI Angela Weaver is a 37 y.o. female.   Sore throat.  Sudden onset.  Started 2 days ago.  10 out of 10 pain.  Worse with swallowing.  Cannot talk without hurting.  No fever.  No ear pain.  No cough.  Patient's 65-year-old child was diagnosed with strep last week.  Patient had strep when she was 37 years old.  Status post tonsillectomy.  No nausea vomiting diarrhea.  No abdominal pain skin rash joint pain.  No dizziness or lightheadedness.   Sore Throat    Past Medical History:  Diagnosis Date   Anemia    Heart murmur    SAW CARDIOLOGIST IN 2011 AND EVERYTHING WAS FINE PER PT-  ASYPMTOMATIC   Hypertension    Chronic   Migraine    migraines   Vaginal Pap smear, abnormal    LGSIL    Patient Active Problem List   Diagnosis Date Noted   Allergic rhinitis 11/15/2018   Bilateral knee swelling 10/04/2018   Migraine with aura and without status migrainosus, not intractable 08/23/2018   History of abnormal cervical Pap smear 08/23/2018   Hypertension 05/20/2014    Past Surgical History:  Procedure Laterality Date   DILATION AND EVACUATION  04/27/2017   Procedure: DILATATION AND EVACUATION;  Surgeon: Nadara Mustard, MD;  Location: [facility] ORS;  Service: Gynecology;;   KNEE SURGERY     TONSILLECTOMY     WISDOM TOOTH EXTRACTION      OB History     Gravida  7   Para  4   Term  4   Preterm      AB  3   Living  4      SAB  3   IAB      Ectopic      Multiple  0   Live Births  4            Home Medications    Prior to Admission medications   Medication Sig Start Date End Date Taking? Authorizing Provider  amoxicillin-clavulanate (AUGMENTIN) 875-125 MG tablet Take 1 tablet by mouth every 12 (twelve) hours. 08/01/22  Yes Lura Em, MD  dexamethasone (DECADRON) 4 MG  tablet Take 1 tablet (4 mg total) by mouth 2 (two) times daily with a meal for 5 days. 08/01/22 08/06/22 Yes Lura Em, MD  metoprolol tartrate (LOPRESSOR) 25 MG tablet Take 37.5 mg by mouth 2 (two) times daily.   Yes [provider]  azelastine (OPTIVAR) 0.05 % ophthalmic solution Place 1 drop into the right eye 2 (two) times daily. Patient not taking: Reported on 02/28/2022 01/03/20   Margaretann Loveless, PA-C  cetirizine (ZYRTEC) 10 MG tablet Take 10 mg by mouth daily. Patient not taking: Reported on 02/28/2022    [provider]  ipratropium (ATROVENT) 0.06 % nasal spray Place 2 sprays into both nostrils 4 (four) times daily. 08/09/21   Becky Augusta, NP  mometasone (NASONEX) 50 MCG/ACT nasal spray Place 2 sprays into the nose daily. Patient not taking: Reported on 02/28/2022    [provider]  montelukast (SINGULAIR) 10 MG tablet Take 10 mg by mouth at bedtime. 01/12/22   [provider]  promethazine-dextromethorphan (PROMETHAZINE-DM) 6.25-15 MG/5ML syrup Take 5 mLs by mouth 4 (four) times daily  as needed. Patient not taking: Reported on 02/28/2022 08/09/21   Becky Augusta, NP  valACYclovir (VALTREX) 500 MG tablet Take 1 tablet (500 mg total) by mouth 3 (three) times daily. 03/04/22   Erasmo Downer, MD    Family History Family History  Problem Relation Age of Onset   Melanoma Maternal Aunt 50   Melanoma Maternal Grandmother 72   Diabetes Maternal Grandfather    Kidney disease Mother    Kidney disease Son        produces a lot of crystal and kidney stones   Breast cancer Neg Hx    Colon cancer Neg Hx     Social History Social History   Tobacco Use   Smoking status: Former    Packs/day: 0.25    Years: 2.00    Additional pack years: 0.00    Total pack years: 0.50    Types: Cigarettes    Quit date: 04/26/2011    Years since quitting: 11.2   Smokeless tobacco: Never   Tobacco comments:    more socially   Vaping Use   Vaping Use:  Never used  Substance Use Topics   Alcohol use: No   Drug use: No     Allergies   Coconut flavor, Sulfa antibiotics, Coconut (cocos nucifera), Codeine, and Codone [hydrocodone]   Review of Systems Negative other than mentioned in HPI.   Physical Exam Triage Vital Signs ED Triage Vitals  Enc Vitals Group     BP 08/01/22 0851 (!) 152/100     Pulse Rate 08/01/22 0851 82     Resp 08/01/22 0851 16     Temp 08/01/22 0851 98.2 F (36.8 C)     Temp Source 08/01/22 0851 Oral     SpO2 08/01/22 0851 100 %     Weight --      Height --      Head Circumference --      Peak Flow --      Pain Score 08/01/22 0850 7     Pain Loc --      Pain Edu? --      Excl. in GC? --    No data found.  Updated Vital Signs BP (!) 152/100 (BP Location: Right Arm)   Pulse 82   Temp 98.2 F (36.8 C) (Oral)   Resp 16   LMP 07/18/2022 (Approximate)   SpO2 100%   Visual Acuity Right Eye Distance:   Left Eye Distance:   Bilateral Distance:    Right Eye Near:   Left Eye Near:    Bilateral Near:     Physical Exam Vitals and nursing note reviewed.  Constitutional:      Appearance: She is well-developed.  HENT:     Head: Normocephalic and atraumatic.     Right Ear: Tympanic membrane normal.     Left Ear: Tympanic membrane normal.     Mouth/Throat:     Mouth: Mucous membranes are moist. No oral lesions.     Pharynx: Uvula midline. Pharyngeal swelling, posterior oropharyngeal erythema and uvula swelling present. No oropharyngeal exudate.     Tonsils: 0 on the right. 0 on the left.  Eyes:     Conjunctiva/sclera: Conjunctivae normal.     Pupils: Pupils are equal, round, and reactive to light.  Cardiovascular:     Rate and Rhythm: Normal rate and regular rhythm.  Pulmonary:     Effort: Pulmonary effort is normal.  Musculoskeletal:     Cervical back: Normal range of motion  and neck supple.  Skin:    General: Skin is warm.  Neurological:     General: No focal deficit present.     Mental  Status: She is alert and oriented to person, place, and time.  Psychiatric:        Mood and Affect: Mood normal.        Behavior: Behavior normal.      UC Treatments / Results  Labs (all labs ordered are listed, but only abnormal results are displayed) Labs Reviewed  GROUP A STREP BY PCR - Abnormal; Notable for the following components:      Result Value   Group A Strep by PCR DETECTED (*)    All other components within normal limits    EKG   Radiology No results found.  Procedures Procedures (including critical care time)  Medications Ordered in UC Medications - No data to display  Initial Impression / Assessment and Plan / UC Course  I have reviewed the triage vital signs and the nursing notes.  Pertinent labs & imaging results that were available during my care of the patient were reviewed by me and considered in my medical decision making (see chart for details).      Final Clinical Impressions(s) / UC Diagnoses   Final diagnoses:  Streptococcal sore throat  Accelerated hypertension     Discharge Instructions      Your rapid strep test is positive. I am starting you on Augmentin.  Take it with food. Dexamethasone to reduce the postpharyngeal swelling. Lidocaine viscous may be used.  Ibuprofen 3-4 times a day may be used to reduce pain and inflammation. May be contagious for the first 24 hours.     ED Prescriptions     Medication Sig Dispense Auth. Provider   amoxicillin-clavulanate (AUGMENTIN) 875-125 MG tablet Take 1 tablet by mouth every 12 (twelve) hours. 14 tablet Lura Em, MD   dexamethasone (DECADRON) 4 MG tablet Take 1 tablet (4 mg total) by mouth 2 (two) times daily with a meal for 5 days. 10 tablet Lura Em, MD      PDMP not reviewed this encounter.   Lura Em, MD 08/01/22 [phone]

## 2022-08-04 ENCOUNTER — Encounter: Payer: Self-pay | Admitting: Physician Assistant

## 2022-08-04 ENCOUNTER — Ambulatory Visit (INDEPENDENT_AMBULATORY_CARE_PROVIDER_SITE_OTHER): Payer: 59 | Admitting: Physician Assistant

## 2022-08-04 VITALS — BP 169/111 | HR 65 | Temp 98.4°F | Wt 139.0 lb

## 2022-08-04 DIAGNOSIS — I10 Essential (primary) hypertension: Secondary | ICD-10-CM | POA: Diagnosis not present

## 2022-08-04 DIAGNOSIS — M545 Low back pain, unspecified: Secondary | ICD-10-CM | POA: Diagnosis not present

## 2022-08-04 MED ORDER — MELOXICAM 7.5 MG PO TABS
7.5000 mg | ORAL_TABLET | Freq: Every day | ORAL | 0 refills | Status: DC
Start: 2022-08-04 — End: 2022-08-08

## 2022-08-04 MED ORDER — METOPROLOL TARTRATE 25 MG PO TABS
37.5000 mg | ORAL_TABLET | Freq: Two times a day (BID) | ORAL | 1 refills | Status: AC
Start: 2022-08-04 — End: ?

## 2022-08-04 MED ORDER — CYCLOBENZAPRINE HCL 5 MG PO TABS
5.0000 mg | ORAL_TABLET | Freq: Three times a day (TID) | ORAL | 0 refills | Status: AC | PRN
Start: 2022-08-04 — End: ?

## 2022-08-04 MED ORDER — LOSARTAN POTASSIUM-HCTZ 50-12.5 MG PO TABS
1.0000 | ORAL_TABLET | Freq: Every day | ORAL | 1 refills | Status: AC
Start: 2022-08-04 — End: ?

## 2022-08-04 NOTE — Progress Notes (Unsigned)
Established patient visit   Patient: Angela Weaver   DOB: [birth year]   37 y.o. Female  MRN: [id number] Visit Date: 08/04/2022  Today's healthcare provider: Debera Lat, PA-C   Chief Complaint  Patient presents with   Back Pain   Hypertension   Subjective    HPI   Back Pain   This is a recurrent problem.  There was not an injury that may have caused the pain.  Recent episode started yesterday.        Follow up ED  Patient was seen on 08/03/2022 at [facility] ER on 08/03/22 for back pain and [facility] urgent care on 08/01/22 for strep throat.   During her ER visit she was noted to have elevated blood pressure and was advised to see PCP top re-initiate hypertension treatment.   Patient is being treated for the strep with Augmentin and is starting to feel better.    Patient was given Toradol and Lidocaine patch from the ER for her back and advised to take Tylenol.  She states she is not getting relief from the back pain with the medication she is taking.   Patient states that she had Covid in November and had been having low blood pressure readings.  She was advised at that time to stop her blood pressure medications and stay off as long as her pressure was controlled.    ----------------------------------------------------------------------------------------- -   Medications: Outpatient Medications Prior to Visit  Medication Sig   amoxicillin-clavulanate (AUGMENTIN) 875-125 MG tablet Take 1 tablet by mouth every 12 (twelve) hours.   dexamethasone (DECADRON) 4 MG tablet Take 1 tablet (4 mg total) by mouth 2 (two) times daily with a meal for 5 days.   azelastine (OPTIVAR) 0.05 % ophthalmic solution Place 1 drop into the right eye 2 (two) times daily. (Patient not taking: Reported on 02/28/2022)   cetirizine (ZYRTEC) 10 MG tablet Take 10 mg by mouth daily. (Patient not taking: Reported on 02/28/2022)   ipratropium (ATROVENT) 0.06 % nasal spray Place 2 sprays into both  nostrils 4 (four) times daily.   mometasone (NASONEX) 50 MCG/ACT nasal spray Place 2 sprays into the nose daily. (Patient not taking: Reported on 02/28/2022)   montelukast (SINGULAIR) 10 MG tablet Take 10 mg by mouth at bedtime.   promethazine-dextromethorphan (PROMETHAZINE-DM) 6.25-15 MG/5ML syrup Take 5 mLs by mouth 4 (four) times daily as needed. (Patient not taking: Reported on 02/28/2022)   valACYclovir (VALTREX) 500 MG tablet Take 1 tablet (500 mg total) by mouth 3 (three) times daily. (Patient not taking: Reported on 08/04/2022)   [DISCONTINUED] metoprolol tartrate (LOPRESSOR) 25 MG tablet Take 37.5 mg by mouth 2 (two) times daily. (Patient not taking: Reported on 08/04/2022)   No facility-administered medications prior to visit.    Review of Systems  Eyes:  Negative for visual disturbance.  Respiratory:  Negative for cough, shortness of breath and wheezing.   Cardiovascular:  Negative for chest pain.  Musculoskeletal:  Positive for arthralgias (knees) and back pain.  Neurological:  Positive for headaches. Negative for tremors and numbness.    {Labs  Heme  Chem  Endocrine  Serology  Results Review (optional):23779}   Objective    BP (!) 169/111 (BP Location: Left Arm, Patient Position: Sitting, Cuff Size: Normal)   Pulse 65   Temp 98.4 F (36.9 C) (Oral)   Wt 139 lb (63 kg)   LMP 07/18/2022 (Approximate)   SpO2 100%   BMI 23.13 kg/m  Vitals:  08/04/22 1110 08/04/22 1117  BP: (!) 176/108 (!) 169/111  Pulse: 65   Temp: 98.4 F (36.9 C)   TempSrc: Oral   SpO2: 100%   Weight: 139 lb (63 kg)      Physical Exam Vitals reviewed.  Constitutional:      General: She is not in acute distress.    Appearance: Normal appearance. She is well-developed. She is not diaphoretic.  HENT:     Head: Normocephalic and atraumatic.  Eyes:     General: No scleral icterus.    Conjunctiva/sclera: Conjunctivae normal.  Neck:     Thyroid: No thyromegaly.  Cardiovascular:     Rate and  Rhythm: Normal rate and regular rhythm.     Pulses: Normal pulses.     Heart sounds: Normal heart sounds. No murmur heard. Pulmonary:     Effort: Pulmonary effort is normal. No respiratory distress.     Breath sounds: Normal breath sounds. No wheezing, rhonchi or rales.  Musculoskeletal:     Cervical back: Neck supple.     Right lower leg: No edema.     Left lower leg: No edema.  Lymphadenopathy:     Cervical: No cervical adenopathy.  Skin:    General: Skin is warm and dry.     Findings: No rash.  Neurological:     Mental Status: She is alert and oriented to person, place, and time. Mental status is at baseline.  Psychiatric:        Behavior: Behavior normal.        Thought Content: Thought content normal.        Judgment: Judgment normal.      No results found for any visits on 08/04/22.  Assessment & Plan     1. Primary hypertension Chronic and previously unstable Advised to continue her BP regimen, however, EKG showed sinus bradycardia. Advised to hold on metoprolol and proceed with HYZAAR - metoprolol tartrate (LOPRESSOR) 25 MG tablet; Take 1.5 tablets (37.5 mg total) by mouth 2 (two) times daily.  Dispense: 60 tablet; Refill: 1 - losartan-hydrochlorothiazide (HYZAAR) 50-12.5 MG tablet; Take 1 tablet by mouth daily.  Dispense: 90 tablet; Refill: 1 - EKG 12-Lead showed sinus bradycardia Advised to start BP log Restrict sodium intake Was scheduled an appt with Dr. B for Monday  2. Acute midline low back pain without sciatica Recently diagnosed problem Was evaluated for back pain on 08/03/22 in ED of [facility] Her ED evaluation was reassuring against heart attack, blood clot, UTI, or vascular causes of her pain. - meloxicam (MOBIC) 7.5 MG tablet; Take 1 tablet (7.5 mg total) by mouth daily.  Dispense: 30 tablet; Refill: 0 - cyclobenzaprine (FLEXERIL) 5 MG tablet; Take 1 tablet (5 mg total) by mouth 3 (three) times daily as needed for muscle spasms.  Dispense: 30 tablet; Refill:  0 Pt preferred to hold on imaging  Precautions were discussed.  FU with Dr. Leonard Schwartz on 08/08/22    The patient was advised to call back or seek an in-person evaluation if the symptoms worsen or if the condition fails to improve as anticipated.  I discussed the assessment and treatment plan with the patient. The patient was provided an opportunity to ask questions and all were answered. The patient agreed with the plan and demonstrated an understanding of the instructions.  I, Debera Lat, PA-C have reviewed all documentation for this visit. The documentation on  08/04/22  for the exam, diagnosis, procedures, and orders are all accurate and complete.  Debera Lat,  [facility], MMS [facility] [phone] (phone) [phone] (fax)   [facility]

## 2022-08-06 ENCOUNTER — Encounter: Payer: Self-pay | Admitting: Physician Assistant

## 2022-08-08 ENCOUNTER — Ambulatory Visit (INDEPENDENT_AMBULATORY_CARE_PROVIDER_SITE_OTHER): Payer: 59 | Admitting: Family Medicine

## 2022-08-08 ENCOUNTER — Encounter: Payer: Self-pay | Admitting: Family Medicine

## 2022-08-08 DIAGNOSIS — M545 Low back pain, unspecified: Secondary | ICD-10-CM

## 2022-08-08 MED ORDER — MELOXICAM 15 MG PO TABS
15.0000 mg | ORAL_TABLET | Freq: Every day | ORAL | 2 refills | Status: AC
Start: 2022-08-08 — End: ?

## 2022-08-08 NOTE — Progress Notes (Signed)
Acute Office Visit  Subjective:     Patient ID: Angela Weaver, female    DOB: [birth year], 37 y.o.   MRN: [id number]  Chief Complaint  Patient presents with   Back Pain    Back Pain Pertinent negatives include no chest pain, tingling or weakness.   Back Pain: Patient presents for presents evaluation of low back problems.  Symptoms have been present for 5 days and include pain in medial lower back that radiates to the right lower back. (throbbing and burning in character; 5/10 in severity). Initial inciting event: none. Symptoms are worst at random times in the day that then self resolve.   Alleviating factors identifiable by patient are medication Meloxicam and cyclobenzaprine which was prescribed when seen last week . Exacerbating factors identifiable by patient are bending backwards and recumbency.   Previous lower back problems: none. Previous workup:  Seen at hospital last week where they completed a chest X-ray, EKG, D-dimer, and urinalysis  . All labs were within normal ranges.    Review of Systems  Cardiovascular:  Negative for chest pain.  Gastrointestinal: Negative.   Genitourinary: Negative.   Musculoskeletal:  Positive for back pain. Negative for joint pain.  Neurological:  Negative for tingling, sensory change and weakness.        Objective:    BP (!) 138/90 (BP Location: Left Arm, Patient Position: Sitting, Cuff Size: Normal)   Pulse (!) 108   Temp 97.8 F (36.6 C) (Temporal)   Resp 16   Wt 138 lb 14.4 oz (63 kg)   LMP 07/18/2022 (Approximate)   Breastfeeding No   BMI 23.11 kg/m    Physical Exam Constitutional:      General: She is not in acute distress. Cardiovascular:     Pulses: Normal pulses.     Heart sounds: Normal heart sounds.  Pulmonary:     Effort: Pulmonary effort is normal.     Breath sounds: Normal breath sounds.  Abdominal:     General: Abdomen is flat. Bowel sounds are normal. There is no distension.     Palpations: There  is no mass.  Musculoskeletal:     Cervical back: Normal.     Thoracic back: Normal.     Lumbar back: Tenderness (Lateral to the lumber spine) present. No swelling, deformity, signs of trauma or bony tenderness. Normal range of motion. Negative right straight leg raise test and negative left straight leg raise test.     Right hip: Normal.     Left hip: Normal.     Comments: Pain exacerbated lateral to the lumbar spine with hip extension.  No point tenderness over the spinous process.    Skin:    General: Skin is warm and dry.  Neurological:     Mental Status: She is alert.     Sensory: No sensory deficit.     Motor: No weakness.     Gait: Gait normal.     No results found for any visits on 08/08/22.      Assessment & Plan:   Problem List Items Addressed This Visit   None Visit Diagnoses     Acute midline low back pain without sciatica     Pain description of throbbing and burning, tenderness to palpation to the lumbar spine, and improvement with muscle relaxants and meloxicam most consistent with nonspecific low back pain/muscular strain. Increased meloxicam from 7.5 mg daily to 15 mg daily. Advised to take cyclobenzaprine at night to minimize drowsiness. HEP given No  indication for imaging currently Discussed return precautions   Relevant Medications   meloxicam (MOBIC) 15 MG tablet      Meds ordered this encounter  Medications   meloxicam (MOBIC) 15 MG tablet    Sig: Take 1 tablet (15 mg total) by mouth daily.    Dispense:  30 tablet    Refill:  2    Return in about 2 months (around 10/08/2022) for CPE.  Gilmer Mor, Medical Student   Patient seen along with MS3 student Ezekiel Slocumb. I personally evaluated this patient along with the student, and verified all aspects of the history, physical exam, and medical decision making as documented by the student. I agree with the student's documentation and have made all necessary edits.  Rayshawn Visconti, Marzella Schlein, MD,  [facility]

## 2022-08-09 ENCOUNTER — Encounter: Payer: Self-pay | Admitting: Family Medicine

## 2022-08-09 DIAGNOSIS — M545 Low back pain, unspecified: Secondary | ICD-10-CM

## 2022-08-11 NOTE — Telephone Encounter (Signed)
Please refer to Ortho for back pain

## 2022-08-12 ENCOUNTER — Telehealth: Payer: Self-pay

## 2022-08-12 DIAGNOSIS — M545 Low back pain, unspecified: Secondary | ICD-10-CM

## 2022-08-12 NOTE — Telephone Encounter (Signed)
Ok to place referral to [facility] instead per patient request

## 2022-08-12 NOTE — Telephone Encounter (Signed)
Copied from CRM [id number]. Topic: Referral - Request for Referral >> Aug 12, 2022 11:00 AM Patsy Lager T wrote: Has patient seen PCP for this complaint? Yes.    Referral for which specialty: Orthopedic Preferred provider/office: unknown Reason for referral: back  Patient stated she was not happy with [facility] and want to go somewhere else

## 2022-08-12 NOTE — Addendum Note (Signed)
Addended by: Hyacinth Meeker on: 08/12/2022 03:57 PM   Modules accepted: Orders

## 2022-10-02 ENCOUNTER — Other Ambulatory Visit: Payer: Self-pay | Admitting: Physician Assistant

## 2022-10-02 DIAGNOSIS — M545 Low back pain, unspecified: Secondary | ICD-10-CM

## 2022-10-03 ENCOUNTER — Ambulatory Visit: Payer: Self-pay | Admitting: *Deleted

## 2022-10-03 MED ORDER — VALACYCLOVIR HCL 1 G PO TABS: 2000.0000 mg | ORAL_TABLET | Freq: Two times a day (BID) | ORAL | 3 refills | Status: AC

## 2022-10-03 NOTE — Telephone Encounter (Signed)
Left detailed message advising as below.  

## 2022-10-03 NOTE — Telephone Encounter (Signed)
Unable to refill per protocol, Rx request is too soon. Last refill 08/08/22 for 30 and 2 refills.  Requested Prescriptions  Pending Prescriptions Disp Refills   meloxicam (MOBIC) 7.5 MG tablet [Pharmacy Med Name: MELOXICAM 7.5 MG TABLET] 30 tablet 0    Sig: TAKE 1 TABLET BY MOUTH EVERY DAY     Analgesics:  COX2 Inhibitors Failed - 10/02/2022  3:21 PM      Failed - Manual Review: Labs are only required if the patient has taken medication for more than 8 weeks.      Failed - HGB in normal range and within 360 days    Hemoglobin  Date Value Ref Range Status  06/27/2019 12.7 11.1 - 15.9 g/dL Final         Failed - Cr in normal range and within 360 days    Creatinine, Ser  Date Value Ref Range Status  01/03/2020 0.75 0.57 - 1.00 mg/dL Final   Creatinine, Urine  Date Value Ref Range Status  06/19/2018 54 mg/dL Final         Failed - HCT in normal range and within 360 days    Hematocrit  Date Value Ref Range Status  06/27/2019 36.3 34.0 - 46.6 % Final         Failed - AST in normal range and within 360 days    AST  Date Value Ref Range Status  06/27/2019 14 0 - 40 IU/L Final         Failed - ALT in normal range and within 360 days    ALT  Date Value Ref Range Status  06/27/2019 13 0 - 32 IU/L Final         Failed - eGFR is 30 or above and within 360 days    GFR calc Af Amer  Date Value Ref Range Status  01/03/2020 119 >59 mL/min/1.73 Final    Comment:    **[facility] currently reports eGFR in compliance with the current**   recommendations of the [employer]. [facility] will   update reporting as new guidelines are published from the NKF-ASN   Task force.    GFR calc non Af Amer  Date Value Ref Range Status  01/03/2020 104 >59 mL/min/1.73 Final         Passed - Patient is not pregnant      Passed - Valid encounter within last 12 months    Recent Outpatient Visits           1 month ago Acute midline low back pain without sciatica   [facility]  [facility] [place], Marzella Schlein, MD   2 months ago Primary hypertension   [facility] [place], [place], PA-C   7 months ago Viral URI   [facility] Caro Lar[place], DO   2 years ago No-show for appointment   [facility] [place] Fl[facility], Marzella Schlein, MD   2 years ago Chest pain, unspecified type   [facility] [place], Ricki Rodriguez [place], [place]

## 2022-10-03 NOTE — Telephone Encounter (Signed)
  Chief Complaint: fever blister, requesting medication  Symptoms: sun exposure over weekend and now "angry" fever blister to lower lip. Has had fever blisters in the past Frequency: started yesterday  Pertinent Negatives: Patient denies fever Disposition: [] ED /[] Urgent Care (no appt availability in office) / [] Appointment(In office/virtual)/ []  [place] [facility]/ [] Home Care/ [x] Refused Recommended Disposition /[] [place] [facility]/ []  Follow-up with PCP Additional Notes:   Recommended OV or VV. Patient reports she has hx of fever blisters and usually requests medication . Please advise if medication valtrex can be prescribed . Not on current med list. Patient out of town now but will be returning tonight . Please advise.      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 APPEARANCE of BLISTERS: "Describe the sores."     "Angry" looking sore on low lip 2. SIZE: "How large an area is involved with the cold sores?" (e.g., inches, cm or compare to coins)     Na  3. LOCATION: "Which part of the lip is involved?"     Low lip 4. ONSET: "When did the fever blisters begin?"     Yesterday  5. RECURRENT BLISTERS: "Have you had fever blisters before?" If Yes, ask: "When was the last time?" "How many times a year?"     Yes  6. OTHER SYMPTOMS: "Do you have any other symptoms?" (e.g., fever, sores inside mouth)     No sun exposure  7. PREGNANCY: "Is there any chance you are pregnant?" "When was your last menstrual period?"     Na  Protocols used: Cold Sores (Fever Blisters)-A-AH

## 2022-12-21 ENCOUNTER — Ambulatory Visit: Payer: Self-pay | Admitting: *Deleted

## 2022-12-21 NOTE — Telephone Encounter (Signed)
Message from Phill Myron sent at 12/21/2022  8:17 AM EDT  Summary: +covid, sore throat bodyache   +Covid, Sore throat and body aches          Call History  Contact Date/Time Type Contact Phone/Fax User  12/21/2022 08:15 AM EDT Phone (Incoming) Mertie Moores, Marlayah Kaufmann" (Self) [phone] Rexene Edison) Luiz Blare, Carrielelia E

## 2022-12-21 NOTE — Telephone Encounter (Signed)
Attempted to return her call.   Left a voicemail to call back.

## 2022-12-21 NOTE — Telephone Encounter (Signed)
Reason for Disposition  [1] HIGH RISK patient (e.g., weak immune system, age > 64 years, obesity with BMI 30 or higher, pregnant, chronic lung disease or other chronic medical condition) AND [2] COVID symptoms (e.g., cough, fever)  (Exceptions: Already seen by PCP and no new or worsening symptoms.)  Answer Assessment - Initial Assessment Questions 1. COVID-19 DIAGNOSIS: "How do you know that you have COVID?" (e.g., positive lab test or self-test, diagnosed by doctor or NP/PA, symptoms after exposure).     Positive for Covid    Sore throat, body aches, runny nose, Headache for 2-3 days.    2. COVID-19 EXPOSURE: "Was there any known exposure to COVID before the symptoms began?" CDC Definition of close contact: within 6 feet (2 meters) for a total of 15 minutes or more over a 24-hour period.      My son tested positive 3. ONSET: "When did the COVID-19 symptoms start?"      Last night I tested positive.  My symptoms started Sat. With a headache.   Sore throat until Sunday night.   4. WORST SYMPTOM: "What is your worst symptom?" (e.g., cough, fever, shortness of breath, muscle aches)     Fatigue 5. COUGH: "Do you have a cough?" If Yes, ask: "How bad is the cough?"       I have drainage down my throat. 6. FEVER: "Do you have a fever?" If Yes, ask: "What is your temperature, how was it measured, and when did it start?"     10 0.9  7. RESPIRATORY STATUS: "Describe your breathing?" (e.g., normal; shortness of breath, wheezing, unable to speak)      No shortness of breath or chest pain 8. BETTER-SAME-WORSE: "Are you getting better, staying the same or getting worse compared to yesterday?"  If getting worse, ask, "In what way?"     no 9. OTHER SYMPTOMS: "Do you have any other symptoms?"  (e.g., chills, fatigue, headache, loss of smell or taste, muscle pain, sore throat)     Headache, body aches, sore throat, runny nose 10. HIGH RISK DISEASE: "Do you have any chronic medical problems?" (e.g., asthma, heart  or lung disease, weak immune system, obesity, etc.)       Heart issue and hypertension.    I had COvid last Nov.   She ordered Paxlovid.    11. VACCINE: "Have you had the COVID-19 vaccine?" If Yes, ask: "Which one, how many shots, when did you get it?"       Not asked 12. PREGNANCY: "Is there any chance you are pregnant?" "When was your last menstrual period?"       Not asked 13. O2 SATURATION MONITOR:  "Do you use an oxygen saturation monitor (pulse oximeter) at home?" If Yes, ask "What is your reading (oxygen level) today?" "What is your usual oxygen saturation reading?" (e.g., 95%)       N/A  Protocols used: Coronavirus (COVID-19) Diagnosed or Suspected-A-AH  Chief Complaint: Covid positive  Has a heart condition so requesting Paxlovid.   Took it when she had Covid the last 2 times she had it. Tested positive last night. Symptoms: Body aches, sore throat, headache, runny nose, fatigue, mild cough due to post nasal drainage. Frequency: Symptoms started Sat. With a headache Pertinent Negatives: Patient denies shortness of breath or chest pain. Disposition: [] ED /[] Urgent Care (no appt availability in office) / [x] Appointment(In office/virtual)/ []  [place] [facility]/ [] Home Care/ [] Refused Recommended Disposition /[] [place] [facility]/ []  Follow-up with PCP Additional Notes: Virtual  visit made with Debera Lat, PA-C for 12/22/2022 at 3:00.

## 2022-12-22 ENCOUNTER — Encounter: Payer: Self-pay | Admitting: Physician Assistant

## 2022-12-22 ENCOUNTER — Telehealth (INDEPENDENT_AMBULATORY_CARE_PROVIDER_SITE_OTHER): Payer: 59 | Admitting: Physician Assistant

## 2022-12-22 DIAGNOSIS — U071 COVID-19: Secondary | ICD-10-CM

## 2022-12-22 MED ORDER — NIRMATRELVIR/RITONAVIR (PAXLOVID) TABLET (RENAL DOSING)
2.0000 | ORAL_TABLET | Freq: Two times a day (BID) | ORAL | 0 refills | Status: AC
Start: 2022-12-22 — End: 2022-12-27

## 2022-12-23 NOTE — Progress Notes (Addendum)
MyChart Video Visit  Virtual Visit via Video Note   This format is felt to be most appropriate for this patient at this time. Physical exam was limited by quality of the video and audio technology used for the visit.   Patient location: office Patient Location: Home  I discussed the limitations of evaluation and management by telemedicine and the availability of in person appointments. The patient expressed understanding and agreed to proceed.  Patient: Angela Weaver   DOB: April 29, 1985   37 y.o. Female  MRN: [id number] Visit Date: 12/22/2022  Today's healthcare provider: Debera Lat, PA-C   Chief Complaint  Patient presents with   Acute Visit Covid - 19   Subjective     HPI        Tested postive for covid, symptoms were headaches and sore throat, symptoms have been going on since Monday afternoon,        Medications: Outpatient Medications Prior to Visit  Medication Sig   cyclobenzaprine (FLEXERIL) 5 MG tablet Take 1 tablet (5 mg total) by mouth 3 (three) times daily as needed for muscle spasms.   losartan-hydrochlorothiazide (HYZAAR) 50-12.5 MG tablet Take 1 tablet by mouth daily.   meloxicam (MOBIC) 15 MG tablet Take 1 tablet (15 mg total) by mouth daily.   metoprolol tartrate (LOPRESSOR) 25 MG tablet Take 1.5 tablets (37.5 mg total) by mouth 2 (two) times daily.   amoxicillin-clavulanate (AUGMENTIN) 875-125 MG tablet Take 1 tablet by mouth every 12 (twelve) hours. (Patient not taking: Reported on 12/22/2022)   No facility-administered medications prior to visit.    Review of Systems  All other systems reviewed and are negative.  Except see HPI       Objective    There were no vitals taken for this visit.      Physical Exam Constitutional:      General: She is not in acute distress.    Appearance: Normal appearance.  HENT:     Head: Normocephalic.  Pulmonary:     Effort: Pulmonary effort is normal. No respiratory distress.  Neurological:      Mental Status: She is alert and oriented to person, place, and time. Mental status is at baseline.        Assessment & Plan    1. COVID-19 - new problem - discussed symptomatic management, natural course, and return precautions  - inside of antiviral window as symptoms present < 5 days but pt is hesitant to start taking paxlovid - discussed masking/isolation   Return if symptoms worsen or fail to improve.   Med was called . Advised to dispense if symptoms worsen - nirmatrelvir/ritonavir, renal dosing, (PAXLOVID) 10 x 150 MG & 10 x 100MG  TABS; Take 2 tablets by mouth 2 (two) times daily for 5 days. (Take nirmatrelvir 150 mg one tablet twice daily for 5 days and ritonavir 100 mg one tablet twice daily for 5 days) Patient GFR is >90  Dispense: 20 tablet; Refill: 0   Counseled risk/benefits of medications. Expected course of illness. Potential rebound symptoms and potential complications and to call if sx worsen or not much better when finished with Paxlovid.     No follow-ups on file.     I discussed the assessment and treatment plan with the patient. The patient was provided an opportunity to ask questions and all were answered. The patient agreed with the plan and demonstrated an understanding of the instructions.   The patient was advised to call back or seek an in-person evaluation if  the symptoms worsen or if the condition fails to improve as anticipated.  I provided 14 minutes of non-face-to-face time during this encounter.  I, Debera Lat, PA-C have reviewed all documentation for this visit. The documentation on  12/22/22  for the exam, diagnosis, procedures, and orders are all accurate and complete.  Debera Lat, [facility], MMS [facility] [phone] (phone) [phone] (fax)  [facility]

## 2022-12-24 ENCOUNTER — Encounter: Payer: Self-pay | Admitting: Physician Assistant

## 2023-02-03 ENCOUNTER — Ambulatory Visit: Payer: Self-pay | Admitting: *Deleted

## 2023-02-03 ENCOUNTER — Encounter: Payer: Self-pay | Admitting: Physician Assistant

## 2023-02-03 ENCOUNTER — Ambulatory Visit: Payer: 59 | Admitting: Physician Assistant

## 2023-02-03 VITALS — BP 145/108 | HR 90 | Temp 98.0°F | Ht 66.0 in | Wt 140.1 lb

## 2023-02-03 DIAGNOSIS — I10 Essential (primary) hypertension: Secondary | ICD-10-CM | POA: Diagnosis not present

## 2023-02-03 DIAGNOSIS — N920 Excessive and frequent menstruation with regular cycle: Secondary | ICD-10-CM

## 2023-02-03 DIAGNOSIS — R0989 Other specified symptoms and signs involving the circulatory and respiratory systems: Secondary | ICD-10-CM

## 2023-02-03 NOTE — Telephone Encounter (Signed)
  Chief Complaint: Almost passed out yesterday while driving home. Symptoms: non productive cough, chest congestion body aches, fever.   When she gets sick her BP drops and her heart races.   She didn't actually pass out.   Was able to pull over and someone came and picked her up. Frequency: Yesterday  Pertinent Negatives: Patient denies diarrhea or vomiting.  Denies nasal congestion.   Had Covid in Sept.   Feels she got over that. Disposition: [] ED /[] Urgent Care (no appt availability in office) / [x] Appointment(In office/virtual)/ []  [place] [facility]/ [] Home Care/ [] Refused Recommended Disposition /[] [place] [facility]/ []  Follow-up with PCP Additional Notes: Appt made for today with Debera Lat, PA-C for 10:00.

## 2023-02-03 NOTE — Telephone Encounter (Signed)
Reason for Disposition  [1] All other patients AND [2] now alert and feels fine  (Exception: SIMPLE FAINT due to stress, pain, prolonged standing, or suddenly standing)  Answer Assessment - Initial Assessment Questions 1. ONSET: "How long were you unconscious?" (minutes) "When did it happen?"     She was driving home from [place].   I got shaky and pulled over.    Someone came and got me.   I was pale.   This has happened before.    My BP drops low and my heart was racing. I started on Wed.congestion in chest, non productive coughing, sore throat, a little ear fullness yesterday but not today.    No vomiting today.    I felt nauseas yesterday prior to passing out.    No diarrhea.    A girl at work has a cough.   My son had surgery last week and was in the hospital.   I may have gotten it there.      I had Covid about 8 weeks ago.   It just went away.    I got well after it.   I get bronchitis every year.   But the passing out almost yesterday I'm not sure. 2. CONTENT: "What happened during period of unconsciousness?" (e.g., seizure activity)      I didn't totally pass out.   3. MENTAL STATUS: "Alert and oriented now?" (oriented x 3 = name, month, location)      Alert and oriented now 4. TRIGGER: "What do you think caused the fainting?" "What were you doing just before you fainted?"  (e.g., exercise, sudden standing up, prolonged standing)     Being sick with this cough and chest congestion.   I had body aches yesterday and chills.   99/5 is my fever.    I'm aching today too 5. RECURRENT SYMPTOM: "Have you ever passed out before?" If Yes, ask: "When was the last time?" and "What happened that time?"      Yes 6. INJURY: "Did you sustain any injury during the fall?"      No 7. CARDIAC SYMPTOMS: "Have you had any of the following symptoms: chest pain, difficulty breathing, palpitations?"     Not asked 8. NEUROLOGIC SYMPTOMS: "Have you had any of the following symptoms: headache, numbness, vertigo,  weakness?"     Coughing, chest congestion, fever, body aches, sore throat 9. GI SYMPTOMS: "Have you had any of the following symptoms: abdomen pain, vomiting, diarrhea, blood in stools?"     No   Nausea prior to almost passing out yesterday but not today 10. OTHER SYMPTOMS: "Do you have any other symptoms?"       No 11. PREGNANCY: "Is there any chance you are pregnant?" "When was your last menstrual period?"       Not asked  Protocols used: Fainting-A-AH

## 2023-02-03 NOTE — Progress Notes (Deleted)
while driving home. Symptoms: non productive cough, chest congestion body aches, fever.   When she gets sick her BP drops and her heart races.   She didn't actually pass out.   Was able to pull over and someone came and picked her up. Frequency: Yesterday  Pertinent Negatives: Patient denies diarrhea or vomiting.  Denies nasal congestion.   Had Covid in Sept.   Feels she got over that. Disposition: [] ED /[] Urgent Care (no appt availability in office) / [x] Appointment(In office/virtual)/ []  [place] [facility]/ [] Home Care/ [] Refused Recommended Disposition /[] [place] [facility]/ []  Follow-up with PCP Additional Notes: Appt made for today with Debera Lat, PA-C for 10:00.        Lannie Fields, RN      02/03/23  9:02 AM Note Reason for Disposition  [1] All other patients AND [2] now alert and feels fine  (Exception: SIMPLE FAINT due to stress, pain, prolonged standing, or suddenly standing)  Answer Assessment - Initial Assessment Questions 1. ONSET: "How long were you unconscious?" (minutes) "When did it happen?"     She was driving home from [place].   I got shaky and pulled over.    Someone came and got me.   I was pale.   This has happened before.    My BP drops low and my heart was racing. I started on Wed.congestion in chest, non productive coughing, sore throat, a little ear fullness yesterday but not today.    No vomiting today.    I felt nauseas yesterday prior to passing out.    No diarrhea.    A girl at work has a cough.   My son had surgery last week and was in the hospital.   I may have gotten it there.      I had Covid about 8 weeks ago.   It just went away.    I got well after it.   I get bronchitis every year.   But the passing out almost yesterday I'm not sure. 2. CONTENT: "What happened during period of unconsciousness?" (e.g., seizure activity)      I didn't totally pass out.   3. MENTAL STATUS: "Alert and oriented now?" (oriented x 3 = name, month, location)       Alert and oriented now 4. TRIGGER: "What do you think caused the fainting?" "What were you doing just before you fainted?"  (e.g., exercise, sudden standing up, prolonged standing)     Being sick with this cough and chest congestion.   I had body aches yesterday and chills.   99/5 is my fever.    I'm aching today too 5. RECURRENT SYMPTOM: "Have you ever passed out before?" If Yes, ask: "When was the last time?" and "What happened that time?"      Yes 6. INJURY: "Did you sustain any injury during the fall?"      No 7. CARDIAC SYMPTOMS: "Have you had any of the following symptoms: chest pain, difficulty breathing, palpitations?"     Not asked 8. NEUROLOGIC SYMPTOMS: "Have you had any of the following symptoms: headache, numbness, vertigo, weakness?"     Coughing, chest congestion, fever, body aches, sore throat 9. GI SYMPTOMS: "Have you had any of the following symptoms: abdomen pain, vomiting, diarrhea, blood in stools?"     No   Nausea prior to almost passing out yesterday but not today 10. OTHER SYMPTOMS: "Do you have any other symptoms?"       No 11. PREGNANCY: "  Is there any chance you are pregnant?" "When was your last menstrual period?"       Not asked

## 2023-02-04 ENCOUNTER — Encounter: Payer: Self-pay | Admitting: Physician Assistant

## 2023-02-04 LAB — CBC WITH DIFFERENTIAL/PLATELET
Basophils Absolute: 0 10*3/uL (ref 0.0–0.2)
Basos: 0 %
EOS (ABSOLUTE): 0 10*3/uL (ref 0.0–0.4)
Eos: 1 %
Hematocrit: 38.3 % (ref 34.0–46.6)
Hemoglobin: 12.5 g/dL (ref 11.1–15.9)
Immature Grans (Abs): 0 10*3/uL (ref 0.0–0.1)
Immature Granulocytes: 0 %
Lymphocytes Absolute: 0.6 10*3/uL — ABNORMAL LOW (ref 0.7–3.1)
Lymphs: 24 %
MCH: 28.3 pg (ref 26.6–33.0)
MCHC: 32.6 g/dL (ref 31.5–35.7)
MCV: 87 fL (ref 79–97)
Monocytes Absolute: 0.5 10*3/uL (ref 0.1–0.9)
Monocytes: 21 %
Neutrophils Absolute: 1.2 10*3/uL — ABNORMAL LOW (ref 1.4–7.0)
Neutrophils: 54 %
Platelets: 215 10*3/uL (ref 150–450)
RBC: 4.42 x10E6/uL (ref 3.77–5.28)
RDW: 11.7 % (ref 11.7–15.4)
WBC: 2.3 10*3/uL — CL (ref 3.4–10.8)

## 2023-02-04 LAB — POCT INFLUENZA A/B

## 2023-02-04 LAB — POC COVID19 BINAXNOW

## 2023-02-04 NOTE — Progress Notes (Signed)
Established patient visit  Patient: Angela Weaver   DOB: [birth year]   37 y.o. Female  MRN: [id number] Visit Date: 02/03/2023  Today's healthcare provider: Debera Lat, PA-C   Chief Complaint  Patient presents with   Cough    Associated with chest congestion, body aches and near syncope while driving yesterday. Patient reports when she is sick her BP drops and her heart races. Reports her congestion began on Wednesday along with a non productive cough, sore throat, minor ear fullness, nausea yesterday. Patient has been in contact with sick coworker and was in hospital last week with son while he had surgery. Hx of bronchitis and covid 8 weeks ago   Subjective     Discussed the use of AI scribe software for clinical note transcription with the patient, who gave verbal consent to proceed.  History of Present Illness   The patient, with a history of high blood pressure and anemia, presents with chest congestion and cough. She reports feeling like she is about to pass out and suspects she may have bronchitis, which she gets every year. She denies having any nasal congestion or runny nose. She also reports a low-grade fever of 99.25F, which has been causing chills and body aches. She has a history of smoking but quit in 2013. She also reports a sore throat and a feeling of fullness in her ears, with muffled sounds and popping when swallowing. She has not been taking any over-the-counter medications for her symptoms due to concerns about her high blood pressure. She also reports having a heavy menstrual period currently.           02/03/2023   10:39 AM 08/08/2022    8:59 AM 08/04/2022   11:08 AM  Depression screen PHQ 2/9  Decreased Interest 0 1 0  Down, Depressed, Hopeless 0 1 1  PHQ - 2 Score 0 2 1  Altered sleeping  1 1  Tired, decreased energy  1 2  Change in appetite  0 2  Feeling bad or failure about yourself   0 1  Trouble concentrating  0 1  Moving slowly or  fidgety/restless  0 0  Suicidal thoughts  0 0  PHQ-9 Score  4 8  Difficult doing work/chores  Not difficult at all Somewhat difficult      02/03/2023   10:39 AM 12/27/2018    8:39 AM 11/15/2018    9:05 AM 10/04/2018   10:40 AM  GAD 7 : Generalized Anxiety Score  Nervous, Anxious, on Edge 2 0 1 2  Control/stop worrying 1 0 1 1  Worry too much - different things 2 0 1 0  Trouble relaxing 2 0 2 1  Restless 1 1 1 1   Easily annoyed or irritable 1 2 2 2   Afraid - awful might happen 0 0 1 1  Total GAD 7 Score 9 3 9 8   Anxiety Difficulty Not difficult at all Not difficult at all Somewhat difficult Somewhat difficult    Medications: Outpatient Medications Prior to Visit  Medication Sig   losartan-hydrochlorothiazide (HYZAAR) 50-12.5 MG tablet Take 1 tablet by mouth daily.   meloxicam (MOBIC) 15 MG tablet Take 1 tablet (15 mg total) by mouth daily.   metoprolol tartrate (LOPRESSOR) 25 MG tablet Take 1.5 tablets (37.5 mg total) by mouth 2 (two) times daily.   amoxicillin-clavulanate (AUGMENTIN) 875-125 MG tablet Take 1 tablet by mouth every 12 (twelve) hours. (Patient not taking: Reported on 12/22/2022)   cyclobenzaprine (FLEXERIL) 5 MG tablet  Take 1 tablet (5 mg total) by mouth 3 (three) times daily as needed for muscle spasms. (Patient not taking: Reported on 02/03/2023)   No facility-administered medications prior to visit.    Review of Systems  All other systems reviewed and are negative.  Except see HPI       Objective    BP (!) 145/108 (BP Location: Left Arm, Patient Position: Sitting, Cuff Size: Normal)   Pulse 90   Temp 98 F (36.7 C) (Oral)   Ht 5\' 6"  (1.676 m)   Wt 140 lb 1.6 oz (63.5 kg)   SpO2 99%   BMI 22.61 kg/m     Physical Exam Vitals reviewed.  Constitutional:      General: She is not in acute distress.    Appearance: Normal appearance. She is well-developed. She is not diaphoretic.  HENT:     Head: Normocephalic and atraumatic.     Right Ear: Ear canal  and external ear normal.     Left Ear: Ear canal and external ear normal.     Nose: Congestion and rhinorrhea present.     Mouth/Throat:     Pharynx: Posterior oropharyngeal erythema (mild) present.  Eyes:     General: No scleral icterus.       Right eye: No discharge.        Left eye: No discharge.     Extraocular Movements: Extraocular movements intact.     Conjunctiva/sclera: Conjunctivae normal.     Pupils: Pupils are equal, round, and reactive to light.  Neck:     Thyroid: No thyromegaly.  Cardiovascular:     Rate and Rhythm: Normal rate and regular rhythm.     Pulses: Normal pulses.     Heart sounds: Normal heart sounds. No murmur heard. Pulmonary:     Effort: Pulmonary effort is normal. No respiratory distress.     Breath sounds: Normal breath sounds. No wheezing, rhonchi or rales.  Musculoskeletal:     Cervical back: Neck supple.     Right lower leg: No edema.     Left lower leg: No edema.  Lymphadenopathy:     Cervical: Cervical adenopathy present.  Skin:    General: Skin is warm and dry.     Findings: No rash.  Neurological:     Mental Status: She is alert and oriented to person, place, and time. Mental status is at baseline.  Psychiatric:        Mood and Affect: Mood normal.        Behavior: Behavior normal.      Results for orders placed or performed in visit on 02/03/23  CBC with Differential/Platelet  Result Value Ref Range   WBC 2.3 (LL) 3.4 - 10.8 x10E3/uL   RBC 4.42 3.77 - 5.28 x10E6/uL   Hemoglobin 12.5 11.1 - 15.9 g/dL   Hematocrit 29.5 62.1 - 46.6 %   MCV 87 79 - 97 fL   MCH 28.3 26.6 - 33.0 pg   MCHC 32.6 31.5 - 35.7 g/dL   RDW 30.8 65.7 - 84.6 %   Platelets 215 150 - 450 x10E3/uL   Neutrophils 54 Not Estab. %   Lymphs 24 Not Estab. %   Monocytes 21 Not Estab. %   Eos 1 Not Estab. %   Basos 0 Not Estab. %   Neutrophils Absolute 1.2 (L) 1.4 - 7.0 x10E3/uL   Lymphocytes Absolute 0.6 (L) 0.7 - 3.1 x10E3/uL   Monocytes Absolute 0.5 0.1 - 0.9  x10E3/uL   EOS (  ABSOLUTE) 0.0 0.0 - 0.4 x10E3/uL   Basophils Absolute 0.0 0.0 - 0.2 x10E3/uL   Immature Granulocytes 0 Not Estab. %   Immature Grans (Abs) 0.0 0.0 - 0.1 x10E3/uL   Hematology Comments: Note:     Assessment & Plan        Upper Respiratory Infection Symptoms of cough, sore throat, and low-grade fever. History of recurrent bronchitis. No evidence of COVID-19 or flu. Likely viral etiology. -Advise warm salt gargle, drinking warm tea with honey, and using a saline rinse or spray. -Consider Mucinex to loosen mucus. -Check CBC to assess for anemia.  Hypertension Elevated blood pressure noted during visit. Patient reports taking medication for hypertension. -Continue current antihypertensive medication.  Heavy Menstrual Bleeding Reports heavy menstrual bleeding, potentially contributing to anemia. -Advise to continue regular gynecological check-ups. -Check CBC to assess for anemia.  General Health Maintenance -Encourage hydration and healthy diet. -Advise patient to follow up with the same provider for continuity of care.      No follow-ups on file.     The patient was advised to call back or seek an in-person evaluation if the symptoms worsen or if the condition fails to improve as anticipated.  I discussed the assessment and treatment plan with the patient. The patient was provided an opportunity to ask questions and all were answered. The patient agreed with the plan and demonstrated an understanding of the instructions.  I, Debera Lat, PA-C have reviewed all documentation for this visit. The documentation on  02/03/23 for the exam, diagnosis, procedures, and orders are all accurate and complete.  Debera Lat, [facility], MMS [facility] [phone] (phone) [phone] (fax)  [facility]

## 2023-02-06 ENCOUNTER — Telehealth: Payer: Self-pay

## 2023-02-06 DIAGNOSIS — D72819 Decreased white blood cell count, unspecified: Secondary | ICD-10-CM

## 2023-02-06 DIAGNOSIS — R058 Other specified cough: Secondary | ICD-10-CM

## 2023-02-06 NOTE — Telephone Encounter (Signed)
Copied from CRM [id number]. Topic: Appointment Scheduling - Scheduling Inquiry for Clinic >> Feb 06, 2023 12:06 PM Turkey B wrote: Reason for CRM: pt called in states Dr B wants to schedule a fu appt for her labs in 2 weeks, just wanted to make sure does if shes need to schedule another lab appt for cbc or office appt to follow up , and the next appt I show is Nov 18 then goes to Dec 3. Please cb for further assistance

## 2023-02-06 NOTE — Telephone Encounter (Signed)
Lab ordered. Patient states Mucinex is not helping. If you can send something in for her please.

## 2023-02-07 ENCOUNTER — Ambulatory Visit: Payer: Self-pay

## 2023-02-07 MED ORDER — BENZONATATE 100 MG PO CAPS: 100.0000 mg | ORAL_CAPSULE | Freq: Three times a day (TID) | ORAL | 0 refills | Status: AC | PRN

## 2023-02-07 NOTE — Telephone Encounter (Signed)
Saw Angela Weaver 11/8 and she evaluated patient. Will forward to her for her to determine next steps. Thanks!

## 2023-02-07 NOTE — Telephone Encounter (Signed)
    Chief Complaint: Seen 02/03/23. Cough is no better, has mild SOB now. Told to call back if no better Symptoms: Above Frequency: Last week Pertinent Negatives: Patient denies fever Disposition: [] ED /[] Urgent Care (no appt availability in office) / [] Appointment(In office/virtual)/ []  [place] [facility]/ [] Home Care/ [] Refused Recommended Disposition /[] [facility]/ [x]  Follow-up with PCP Additional Notes: Please advise pt.  Reason for Disposition  [1] MILD difficulty breathing (e.g., minimal/no SOB at rest, SOB with walking, pulse <100) AND [2] still present when not coughing  Answer Assessment - Initial Assessment Questions 1. ONSET: "When did the cough begin?"      Last week 2. SEVERITY: "How bad is the cough today?"      Severe 3. SPUTUM: "Describe the color of your sputum" (none, dry cough; clear, white, yellow, green)     None 4. HEMOPTYSIS: "Are you coughing up any blood?" If so ask: "How much?" (flecks, streaks, tablespoons, etc.)     No 5. DIFFICULTY BREATHING: "Are you having difficulty breathing?" If Yes, ask: "How bad is it?" (e.g., mild, moderate, severe)    - MILD: No SOB at rest, mild SOB with walking, speaks normally in sentences, can lie down, no retractions, pulse < 100.    - MODERATE: SOB at rest, SOB with minimal exertion and prefers to sit, cannot lie down flat, speaks in phrases, mild retractions, audible wheezing, pulse 100-120.    - SEVERE: Very SOB at rest, speaks in single words, struggling to breathe, sitting hunched forward, retractions, pulse > 120      Mild 6. FEVER: "Do you have a fever?" If Yes, ask: "What is your temperature, how was it measured, and when did it start?"     No 7. CARDIAC HISTORY: "Do you have any history of heart disease?" (e.g., heart attack, congestive heart failure)      No 8. LUNG HISTORY: "Do you have any history of lung disease?"  (e.g., pulmonary embolus, asthma, emphysema)     No 9. PE RISK FACTORS: "Do you  have a history of blood clots?" (or: recent major surgery, recent prolonged travel, bedridden)     No 10. OTHER SYMPTOMS: "Do you have any other symptoms?" (e.g., runny nose, wheezing, chest pain)       No 11. PREGNANCY: "Is there any chance you are pregnant?" "When was your last menstrual period?"       No 12. TRAVEL: "Have you traveled out of the country in the last month?" (e.g., travel history, exposures)       No  Protocols used: Cough - Acute Non-Productive-A-AH

## 2023-04-18 ENCOUNTER — Other Ambulatory Visit: Payer: Self-pay

## 2023-04-18 DIAGNOSIS — D72819 Decreased white blood cell count, unspecified: Secondary | ICD-10-CM

## 2023-04-19 LAB — CBC WITH DIFFERENTIAL/PLATELET
Basophils Absolute: 0 10*3/uL (ref 0.0–0.2)
Basos: 1 %
EOS (ABSOLUTE): 0.1 10*3/uL (ref 0.0–0.4)
Eos: 1 %
Hematocrit: 41.3 % (ref 34.0–46.6)
Hemoglobin: 13.6 g/dL (ref 11.1–15.9)
Immature Grans (Abs): 0 10*3/uL (ref 0.0–0.1)
Immature Granulocytes: 0 %
Lymphocytes Absolute: 0.9 10*3/uL (ref 0.7–3.1)
Lymphs: 23 %
MCH: 28.6 pg (ref 26.6–33.0)
MCHC: 32.9 g/dL (ref 31.5–35.7)
MCV: 87 fL (ref 79–97)
Monocytes Absolute: 0.3 10*3/uL (ref 0.1–0.9)
Monocytes: 7 %
Neutrophils Absolute: 2.5 10*3/uL (ref 1.4–7.0)
Neutrophils: 68 %
Platelets: 288 10*3/uL (ref 150–450)
RBC: 4.76 x10E6/uL (ref 3.77–5.28)
RDW: 11.5 % — ABNORMAL LOW (ref 11.7–15.4)
WBC: 3.8 10*3/uL (ref 3.4–10.8)

## 2023-04-20 ENCOUNTER — Encounter: Payer: Self-pay | Admitting: Physician Assistant
